# Patient Record
Sex: Female | Born: 1960 | Race: White | Hispanic: Yes | Smoking: Current every day smoker
Health system: Southern US, Community
[De-identification: ages and names within clinical notes are randomized; demographics above are authoritative.]

## PROBLEM LIST (undated history)

## (undated) DIAGNOSIS — F329 Major depressive disorder, single episode, unspecified: Secondary | ICD-10-CM

## (undated) DIAGNOSIS — R7303 Prediabetes: Secondary | ICD-10-CM

## (undated) DIAGNOSIS — F32A Depression, unspecified: Secondary | ICD-10-CM

## (undated) DIAGNOSIS — Z9889 Other specified postprocedural states: Secondary | ICD-10-CM

## (undated) DIAGNOSIS — F419 Anxiety disorder, unspecified: Secondary | ICD-10-CM

## (undated) DIAGNOSIS — M1990 Unspecified osteoarthritis, unspecified site: Secondary | ICD-10-CM

## (undated) DIAGNOSIS — Z8489 Family history of other specified conditions: Secondary | ICD-10-CM

## (undated) DIAGNOSIS — R112 Nausea with vomiting, unspecified: Secondary | ICD-10-CM

## (undated) DIAGNOSIS — F319 Bipolar disorder, unspecified: Secondary | ICD-10-CM

## (undated) DIAGNOSIS — I10 Essential (primary) hypertension: Secondary | ICD-10-CM

## (undated) HISTORY — PX: OTHER SURGICAL HISTORY: SHX169

## (undated) HISTORY — PX: JOINT REPLACEMENT: SHX530

## (undated) HISTORY — PX: CARPAL TUNNEL RELEASE: SHX101

## (undated) HISTORY — PX: COLONOSCOPY: SHX174

## (undated) HISTORY — DX: Bipolar disorder, unspecified: F31.9

## (undated) HISTORY — PX: TUBAL LIGATION: SHX77

## (undated) HISTORY — DX: Unspecified osteoarthritis, unspecified site: M19.90

## (undated) HISTORY — PX: ANTERIOR CRUCIATE LIGAMENT (ACL) REVISION: SHX6707

## (undated) HISTORY — DX: Depression, unspecified: F32.A

## (undated) HISTORY — DX: Anxiety disorder, unspecified: F41.9

## (undated) HISTORY — DX: Major depressive disorder, single episode, unspecified: F32.9

---

## 1988-03-09 HISTORY — PX: WRIST ARTHROSCOPY: SUR100

## 2011-03-10 HISTORY — PX: ENDOMETRIAL ABLATION: SHX621

## 2011-03-10 HISTORY — PX: DILATION AND CURETTAGE OF UTERUS: SHX78

## 2012-11-23 ENCOUNTER — Ambulatory Visit: Payer: Self-pay

## 2016-09-22 ENCOUNTER — Emergency Department: Admission: EM | Admit: 2016-09-22 | Discharge: 2016-09-22 | Discharge status: Against Medical Advice | Payer: Self-pay

## 2017-07-13 ENCOUNTER — Ambulatory Visit (INDEPENDENT_AMBULATORY_CARE_PROVIDER_SITE_OTHER): Payer: Medicaid Other | Admitting: Family Medicine

## 2017-07-13 ENCOUNTER — Ambulatory Visit
Admission: RE | Admit: 2017-07-13 | Discharge: 2017-07-13 | Disposition: A | Discharge status: Home / Self Care | Payer: Medicaid Other | Source: Ambulatory Visit | Attending: Family Medicine | Admitting: Family Medicine

## 2017-07-13 ENCOUNTER — Encounter: Payer: Self-pay | Admitting: Family Medicine

## 2017-07-13 ENCOUNTER — Telehealth: Payer: Self-pay

## 2017-07-13 VITALS — BP 122/82 | HR 90 | Temp 97.9°F | Resp 16 | Ht 64.0 in | Wt 178.0 lb

## 2017-07-13 DIAGNOSIS — M25561 Pain in right knee: Secondary | ICD-10-CM | POA: Insufficient documentation

## 2017-07-13 DIAGNOSIS — M5136 Other intervertebral disc degeneration, lumbar region: Secondary | ICD-10-CM | POA: Diagnosis not present

## 2017-07-13 DIAGNOSIS — M1990 Unspecified osteoarthritis, unspecified site: Secondary | ICD-10-CM | POA: Insufficient documentation

## 2017-07-13 DIAGNOSIS — F419 Anxiety disorder, unspecified: Secondary | ICD-10-CM | POA: Insufficient documentation

## 2017-07-13 DIAGNOSIS — R6889 Other general symptoms and signs: Secondary | ICD-10-CM | POA: Diagnosis not present

## 2017-07-13 DIAGNOSIS — F17200 Nicotine dependence, unspecified, uncomplicated: Secondary | ICD-10-CM | POA: Insufficient documentation

## 2017-07-13 DIAGNOSIS — M1711 Unilateral primary osteoarthritis, right knee: Secondary | ICD-10-CM | POA: Insufficient documentation

## 2017-07-13 DIAGNOSIS — I10 Essential (primary) hypertension: Secondary | ICD-10-CM | POA: Insufficient documentation

## 2017-07-13 DIAGNOSIS — G8929 Other chronic pain: Secondary | ICD-10-CM | POA: Diagnosis not present

## 2017-07-13 DIAGNOSIS — M11261 Other chondrocalcinosis, right knee: Secondary | ICD-10-CM | POA: Insufficient documentation

## 2017-07-13 DIAGNOSIS — F319 Bipolar disorder, unspecified: Secondary | ICD-10-CM | POA: Diagnosis not present

## 2017-07-13 DIAGNOSIS — M47817 Spondylosis without myelopathy or radiculopathy, lumbosacral region: Secondary | ICD-10-CM | POA: Diagnosis not present

## 2017-07-13 DIAGNOSIS — M25562 Pain in left knee: Secondary | ICD-10-CM | POA: Insufficient documentation

## 2017-07-13 DIAGNOSIS — M25531 Pain in right wrist: Secondary | ICD-10-CM | POA: Insufficient documentation

## 2017-07-13 DIAGNOSIS — Z720 Tobacco use: Secondary | ICD-10-CM

## 2017-07-13 DIAGNOSIS — R030 Elevated blood-pressure reading, without diagnosis of hypertension: Secondary | ICD-10-CM

## 2017-07-13 DIAGNOSIS — M545 Low back pain: Secondary | ICD-10-CM | POA: Insufficient documentation

## 2017-07-13 DIAGNOSIS — Z5181 Encounter for therapeutic drug level monitoring: Secondary | ICD-10-CM | POA: Insufficient documentation

## 2017-07-13 DIAGNOSIS — Z791 Long term (current) use of non-steroidal anti-inflammatories (NSAID): Secondary | ICD-10-CM

## 2017-07-13 DIAGNOSIS — M25461 Effusion, right knee: Secondary | ICD-10-CM | POA: Diagnosis not present

## 2017-07-13 DIAGNOSIS — M25532 Pain in left wrist: Secondary | ICD-10-CM | POA: Insufficient documentation

## 2017-07-13 NOTE — Assessment & Plan Note (Signed)
Discussed risks of ongoing chronic NSAID use Check creatinine today

## 2017-07-13 NOTE — Progress Notes (Signed)
Patient: Alexa Newman, Female    DOB: Jun 06, 1960, 57 y.o.   MRN: [id number] Visit Date: 07/13/2017  Today's Provider: [name], MD   I, Martha Clan, CMA, am acting as scribe for [name], MD.  Chief Complaint  Patient presents with  . Establish Care   Subjective:    Establish Care Alexa Newman is a 57 y.o. female who presents today to establish care. She feels fairly well.  Pt's PMH includes anxiety, depression, chronic back pain/arthritis. Pt was previously taking diclofenac 100 mg BID, Lamictal 200 mg daily, propanolol 10 mg PRN, and Flexeril 5 mg PRN. She has not a had a PCP in about 3 years. She states she has never had a colonoscopy, has not had a mammogram in about 3 years.  Patient reports history of swelling, pain, and stiffness of multiple joints including bilateral wrist, knees, and low back.  She states that she had a blood test in the past that showed she did not have rheumatoid arthritis.  She was previously taking diclofenac regularly and Flexeril as needed and was in less pain when she is doing this.  She is currently taking ibuprofen daily and states that helped her joints some.  She thinks that the air conditioning makes them feel worse.  She denies instability.  She does have some tingling of her fingers of both hands right worse than left.  This is worse when sleeping.  She has noticed swelling of her right wrist that is worsening over the last few years.  Previously diagnosed with bipolar disorder and anxiety and was followed by psychiatry previously.  States that she was previously well controlled on Lamictal.  She has been off of this medicine for at least 3 years.  She is interested in seeing a psychiatrist again.  She states that since being off her Lamictal she has found herself to have more manic episodes.  She states that her niece who also has bipolar disorder seems "normal" on her new medication and she is interested in doing the  same.  Patient is postmenopausal and status post D&C and endometrial ablation for heavy uterine bleeding.  She denies any postmenopausal bleeding.  She was previously taking black cohosh which helped with her hot flashes.  She finds herself now sweating more profusely than previously.  She wonders if this is related to her menopausal state.  Patient is a long-term smoker.  She states she is not interested in quitting and knows the risks of continuing to smoke.  Her mother died of lung cancer.  Depression screen PHQ 2/9 07/13/2017  Decreased Interest 1  Down, Depressed, Hopeless 1  PHQ - 2 Score 2  Altered sleeping 3  Tired, decreased energy 1  Change in appetite 1  Feeling bad or failure about yourself  1  Trouble concentrating 3  Moving slowly or fidgety/restless 0  Suicidal thoughts 0  PHQ-9 Score 11  Difficult doing work/chores Not difficult at all   GAD 7 : Generalized Anxiety Score 07/13/2017  Nervous, Anxious, on Edge 2  Control/stop worrying 2  Worry too much - different things 2  Trouble relaxing 2  Restless 1  Easily annoyed or irritable 2  Afraid - awful might happen 1  Total GAD 7 Score 12  Anxiety Difficulty Somewhat difficult   -----------------------------------------------------------------   Review of Systems  Constitutional: Positive for diaphoresis. Negative for activity change, appetite change, chills, fatigue, fever and unexpected weight change.  HENT: Negative.  Eyes: Negative.   Respiratory: Negative.   Cardiovascular: Negative.   Gastrointestinal: Negative.   Endocrine: Negative.   Genitourinary: Negative.   Musculoskeletal: Negative.   Skin: Negative.   Allergic/Immunologic: Positive for environmental allergies. Negative for food allergies and immunocompromised state.  Neurological: Positive for numbness. Negative for dizziness, tremors, seizures, syncope, facial asymmetry, speech difficulty, weakness, light-headedness and headaches.  Hematological:  Negative.   Psychiatric/Behavioral: Positive for agitation, confusion, decreased concentration and sleep disturbance. Negative for behavioral problems, dysphoric mood, hallucinations, self-injury and suicidal ideas. The patient is nervous/anxious. The patient is not hyperactive.     Social History      She  reports that she has been smoking cigarettes.  She has a 33.75 pack-year smoking history. She has never used smokeless tobacco. She reports that she drinks alcohol. She reports that she has current or past drug history.       Social History   Socioeconomic History  . Marital status: Divorced    Spouse name: Not on file  . Number of children: 1  . Years of education: Not on file  . Highest education level: Some college, no degree  Occupational History  . Occupation: disabled    Comment: on SSI awaiting decision about disability  Social Needs  . Financial resource strain: Not on file  . Food insecurity:    Worry: Not on file    Inability: Not on file  . Transportation needs:    Medical: Not on file    Non-medical: Not on file  Tobacco Use  . Smoking status: Current Every Day Smoker    Packs/day: 0.75    Years: 45.00    Pack years: 33.75    Types: Cigarettes  . Smokeless tobacco: Never Used  . Tobacco comment: started smoking at age 84  Substance and Sexual Activity  . Alcohol use: Yes    Comment: 1-2 times per month  . Drug use: Not Currently    Comment: former crack cocaine user  . Sexual activity: Yes    Partners: Male    Birth control/protection: Post-menopausal, Surgical  Lifestyle  . Physical activity:    Days per week: Not on file    Minutes per session: Not on file  . Stress: Not on file  Relationships  . Social connections:    Talks on phone: Not on file    Gets together: Not on file    Attends religious service: Not on file    Active member of club or organization: Not on file    Attends meetings of clubs or organizations: Not on file    Relationship  status: Not on file  Other Topics Concern  . Not on file  Social History Narrative  . Not on file    Past Medical History:  Diagnosis Date  . Anxiety   . Arthritis   . Bipolar 1 disorder ([place])   . Depression      Patient Active Problem List   Diagnosis Date Noted  . Heat intolerance 07/13/2017  . Encounter for monitoring chronic NSAID therapy 07/13/2017  . Tobacco abuse 07/13/2017  . Elevated BP without diagnosis of hypertension 07/13/2017  . Bipolar 1 disorder ([place])   . Anxiety   . Arthritis     Past Surgical History:  Procedure Laterality Date  . ANTERIOR CRUCIATE LIGAMENT (ACL) REVISION Right   . DILATION AND CURETTAGE OF UTERUS  2013   for heavy bleeding  . ENDOMETRIAL ABLATION  2013  . TUBAL LIGATION    .  WRIST ARTHROSCOPY Left 1990   for degenerative changes    Family History        Family Status  Relation Name Status  . Mother  Deceased  . Father  Deceased  . Sister  Deceased  . Neg Hx  (Not Specified)        Her family history includes Breast cancer (age of onset: 4) in her sister; Congestive Heart Failure in her father; Crohn's disease in her mother; Depression in her mother; Diabetes in her mother; Hypertension in her mother; Lung cancer (age of onset: 41) in her mother. There is no history of Colon cancer, Ovarian cancer, or Cervical cancer.      No Known Allergies   Current Outpatient Medications:  .  ibuprofen (ADVIL,MOTRIN) 200 MG tablet, Take 400-600 mg by mouth every 6 (six) hours as needed., Disp: , Rfl:    Patient Care Team: Virginia Crews, MD as PCP - General (Family Medicine)      Objective:   Vitals: BP 122/82 (BP Location: Left Arm, Cuff Size: Normal)   Pulse 90   Temp 97.9 F (36.6 C) (Oral)   Resp 16   Ht 5\' 4"  (1.626 m)   Wt 178 lb (80.7 kg)   LMP 12/07/2011   SpO2 98%   BMI 30.55 kg/m    Vitals:   07/13/17 1013 07/13/17 1103  BP: (!) 138/92 122/82  Pulse: 90   Resp: 16   Temp: 97.9 F (36.6 C)   TempSrc:  Oral   SpO2: 98%   Weight: 178 lb (80.7 kg)   Height: 5\' 4"  (1.626 m)      Physical Exam  Constitutional: She is oriented to person, place, and time. She appears well-developed and well-nourished. No distress.  HENT:  Head: Normocephalic and atraumatic.  Right Ear: External ear normal.  Left Ear: External ear normal.  Nose: Nose normal.  Mouth/Throat: Oropharynx is clear and moist.  Eyes: Pupils are equal, round, and reactive to light. Conjunctivae and EOM are normal. No scleral icterus.  Neck: Neck supple. No thyromegaly present.  Cardiovascular: Normal rate, regular rhythm, normal heart sounds and intact distal pulses.  No murmur heard. Pulmonary/Chest: Effort normal and breath sounds normal. No respiratory distress. She has no wheezes. She has no rales.  Abdominal: Soft. Bowel sounds are normal. She exhibits no distension. There is no tenderness. There is no rebound and no guarding.  Musculoskeletal:  Wrists: Tenderness palpation at base of thumb and diffusely over carpal bones of bilateral wrists.  Positive Tinel's and Phalen's.  Significant swelling over dorsal aspect of right wrist Knees: Range of motion is intact.  Mild swelling of right knee is present.  No joint line tenderness.  No tenderness over patellar or quadriceps tendons.  Ligaments with stable endpoints able to walk  Lymphadenopathy:    She has no cervical adenopathy.  Neurological: She is alert and oriented to person, place, and time.  Skin: Skin is warm and dry. Capillary refill takes less than 2 seconds. No rash noted.  Psychiatric: She has a normal mood and affect. Her speech is normal and behavior is normal. Judgment and thought content normal. Cognition and memory are normal.  Vitals reviewed.   Assessment & Plan:    Problem List Items Addressed This Visit      Musculoskeletal and Integument   Arthritis    Pain, swelling, stiffness chronically in multiple joints including wrists, knees, or spine We will  start by getting x-rays of these joints to  evaluate further Discussed risks of chronic NSAID use For now, patient can use Aleve up to twice daily as needed We will check her creatinine today Referral to orthopedics for further evaluation and management She may benefit from corticosteroid injections especially of her knees and possibly L-spine Suspect there may be a component of bilateral carpal tunnel syndrome possibly related to degenerative changes      Relevant Medications   ibuprofen (ADVIL,MOTRIN) 200 MG tablet   Other Relevant Orders   DG Knee Complete 4 Views Right   DG Knee Complete 4 Views Left   DG Knee Bilateral Standing AP   DG Lumbar Spine Complete   DG Wrist Complete Left   DG Wrist Complete Right   Ambulatory referral to Orthopedic Surgery     Other   Bipolar 1 disorder ([place])    Patient currently untreated and was previously on mood stabilizers Referral to psychiatry for medication management      Relevant Orders   Ambulatory referral to Psychiatry   Anxiety    Would avoid SSRIs for management of anxiety given history of bipolar disorder with manic episodes Referral to psychiatry as above for bipolar disorder as well as anxiety Would also avoid benzos at this time given her history of crack cocaine abuse      Relevant Orders   Ambulatory referral to Psychiatry   Heat intolerance    Suspect this is related to vasomotor symptoms of menopause Would avoid treatment with SSRIs given history of bipolar disorder with manic episodes Check TSH      Relevant Orders   TSH   Encounter for monitoring chronic NSAID therapy    Discussed risks of ongoing chronic NSAID use Check creatinine today      Relevant Orders   Comprehensive metabolic panel   Tobacco abuse    3 to 5-minute discussion regarding the health risks of continuing smoking, the importance of cessation, and options for cessation Patient's mother died of lung cancer due to smoking and her brother is  being monitored for a lung nodule We will discuss lung cancer screening at her upcoming physical exam She is pre-contemplative and not interested in quitting at this time but we will continue to assess      Elevated BP without diagnosis of hypertension    Blood pressure elevated today, but patient states she is quite stressed today We will continue to monitor Advised on diet and exercise          Return in about 3 months (around 10/13/2017) for CPE.   The entirety of the information documented in the History of Present Illness, Review of Systems and Physical Exam were personally obtained by me. Portions of this information were initially documented by Raquel Sarna Ratchford, CMA and reviewed by me for thoroughness and accuracy.    Virginia Crews, MD, MPH [facility] 07/13/2017 11:19 AM

## 2017-07-13 NOTE — Assessment & Plan Note (Signed)
Blood pressure elevated today, but patient states she is quite stressed today We will continue to monitor Advised on diet and exercise

## 2017-07-13 NOTE — Assessment & Plan Note (Signed)
Suspect this is related to vasomotor symptoms of menopause Would avoid treatment with SSRIs given history of bipolar disorder with manic episodes Check TSH

## 2017-07-13 NOTE — Assessment & Plan Note (Signed)
Would avoid SSRIs for management of anxiety given history of bipolar disorder with manic episodes Referral to psychiatry as above for bipolar disorder as well as anxiety Would also avoid benzos at this time given her history of crack cocaine abuse

## 2017-07-13 NOTE — Assessment & Plan Note (Signed)
Patient currently untreated and was previously on mood stabilizers Referral to psychiatry for medication management

## 2017-07-13 NOTE — Patient Instructions (Signed)

## 2017-07-13 NOTE — Assessment & Plan Note (Signed)
3 to 5-minute discussion regarding the health risks of continuing smoking, the importance of cessation, and options for cessation Patient's mother died of lung cancer due to smoking and her brother is being monitored for a lung nodule We will discuss lung cancer screening at her upcoming physical exam She is pre-contemplative and not interested in quitting at this time but we will continue to assess

## 2017-07-13 NOTE — Telephone Encounter (Signed)
Tiffany from outpatient imaging called to request a cancellation of the bilateral knee standing AP xray. States they will have the AP image done through the bilateral complete 4 view xrays.

## 2017-07-13 NOTE — Assessment & Plan Note (Signed)
Pain, swelling, stiffness chronically in multiple joints including wrists, knees, or spine We will start by getting x-rays of these joints to evaluate further Discussed risks of chronic NSAID use For now, patient can use Aleve up to twice daily as needed We will check her creatinine today Referral to orthopedics for further evaluation and management She may benefit from corticosteroid injections especially of her knees and possibly L-spine Suspect there may be a component of bilateral carpal tunnel syndrome possibly related to degenerative changes

## 2017-07-14 ENCOUNTER — Telehealth: Payer: Self-pay

## 2017-07-14 LAB — COMPREHENSIVE METABOLIC PANEL
A/G RATIO: 1.6 (ref 1.2–2.2)
ALK PHOS: 91 IU/L (ref 39–117)
ALT: 9 IU/L (ref 0–32)
AST: 10 IU/L (ref 0–40)
Albumin: 3.9 g/dL (ref 3.5–5.5)
BUN/Creatinine Ratio: 23 (ref 9–23)
BUN: 12 mg/dL (ref 6–24)
CHLORIDE: 105 mmol/L (ref 96–106)
CO2: 23 mmol/L (ref 20–29)
Calcium: 9.2 mg/dL (ref 8.7–10.2)
Creatinine, Ser: 0.53 mg/dL — ABNORMAL LOW (ref 0.57–1.00)
GFR calc non Af Amer: 106 mL/min/{1.73_m2} (ref >59)
GFR, EST AFRICAN AMERICAN: 123 mL/min/{1.73_m2} (ref >59)
Globulin, Total: 2.4 g/dL (ref 1.5–4.5)
Glucose: 96 mg/dL (ref 65–99)
POTASSIUM: 4.3 mmol/L (ref 3.5–5.2)
Sodium: 141 mmol/L (ref 134–144)
Total Protein: 6.3 g/dL (ref 6.0–8.5)

## 2017-07-14 LAB — TSH: TSH: 0.336 u[IU]/mL — AB (ref 0.450–4.500)

## 2017-07-14 NOTE — Addendum Note (Signed)
Addended by: Virginia Crews on: 07/14/2017 08:25 AM   Modules accepted: Orders

## 2017-07-14 NOTE — Telephone Encounter (Signed)
Done  Bacigalupo, Dionne Bucy, MD, MPH [facility] 07/14/2017 8:25 AM

## 2017-07-14 NOTE — Telephone Encounter (Signed)
-----   Message from Virginia Crews, MD sent at 07/14/2017  8:35 AM EDT ----- Normal kidney function, liver function, electrolytes.  Thyroid function level is slightly low.  We will send a confirmatory test (please add on free T4) to see how significant this is.  Virginia Crews, MD, MPH [facility] 07/14/2017 8:35 AM

## 2017-07-14 NOTE — Telephone Encounter (Signed)
-----   Message from Virginia Crews, MD sent at 07/14/2017  8:57 AM EDT ----- Multiple XRays reviewed.  Bilateral wrists with widening of scapholunate joint space which looks to be from a possible ligament injury previously.  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  Some degenerative changes of low back.  Orthopedics will follow-up on this.  Virginia Crews, MD, MPH [facility] 07/14/2017 8:57 AM

## 2017-07-14 NOTE — Telephone Encounter (Signed)
Pt advised of lab results. Asked the phlebotomist to add on the free T4.

## 2017-07-14 NOTE — Telephone Encounter (Signed)
Pt advised.

## 2017-07-16 ENCOUNTER — Telehealth: Payer: Self-pay

## 2017-07-16 LAB — SPECIMEN STATUS REPORT

## 2017-07-16 LAB — T4, FREE: FREE T4: 1.19 ng/dL (ref 0.82–1.77)

## 2017-07-16 NOTE — Telephone Encounter (Signed)
-----   Message from Virginia Crews, MD sent at 07/16/2017  8:16 AM EDT ----- Thyroid hormone is normal.  We will recheck thyroid labs in ~6 months to continue to follow this  Virginia Crews, MD, MPH [facility] 07/16/2017 8:16 AM

## 2017-07-16 NOTE — Telephone Encounter (Signed)
Left message advising pt. OK per DPR. 

## 2017-07-19 DIAGNOSIS — M47816 Spondylosis without myelopathy or radiculopathy, lumbar region: Secondary | ICD-10-CM | POA: Diagnosis not present

## 2017-07-19 DIAGNOSIS — M545 Low back pain: Secondary | ICD-10-CM | POA: Diagnosis not present

## 2017-07-19 DIAGNOSIS — M25531 Pain in right wrist: Secondary | ICD-10-CM | POA: Diagnosis not present

## 2017-07-19 DIAGNOSIS — M19131 Post-traumatic osteoarthritis, right wrist: Secondary | ICD-10-CM | POA: Diagnosis not present

## 2017-07-27 ENCOUNTER — Other Ambulatory Visit: Payer: Self-pay

## 2017-07-27 ENCOUNTER — Ambulatory Visit (INDEPENDENT_AMBULATORY_CARE_PROVIDER_SITE_OTHER): Payer: Medicaid Other | Admitting: Psychiatry

## 2017-07-27 ENCOUNTER — Encounter: Payer: Self-pay | Admitting: Psychiatry

## 2017-07-27 VITALS — BP 154/89 | HR 97 | Temp 98.4°F | Wt 176.6 lb

## 2017-07-27 DIAGNOSIS — F3162 Bipolar disorder, current episode mixed, moderate: Secondary | ICD-10-CM

## 2017-07-27 DIAGNOSIS — F1421 Cocaine dependence, in remission: Secondary | ICD-10-CM | POA: Diagnosis not present

## 2017-07-27 DIAGNOSIS — F17200 Nicotine dependence, unspecified, uncomplicated: Secondary | ICD-10-CM | POA: Diagnosis not present

## 2017-07-27 DIAGNOSIS — F5105 Insomnia due to other mental disorder: Secondary | ICD-10-CM | POA: Diagnosis not present

## 2017-07-27 DIAGNOSIS — Z8659 Personal history of other mental and behavioral disorders: Secondary | ICD-10-CM | POA: Diagnosis not present

## 2017-07-27 MED ORDER — LAMOTRIGINE 25 MG PO TABS: 25.0000 mg | ORAL_TABLET | Freq: Every day | ORAL | 0 refills | Status: DC

## 2017-07-27 MED ORDER — MIRTAZAPINE 15 MG PO TABS: 7.5000 mg | ORAL_TABLET | Freq: Every day | ORAL | 1 refills | Status: DC
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
## 2017-07-27 NOTE — Patient Instructions (Signed)
Stevens-Johnson Syndrome Stevens-Johnson syndrome is a disorder of the mucous membranes and skin. This disorder causes these things to happen:  The mucous membranes become inflamed.  The top layer of skin dies and starts to shed. The more skin that dies, the more serious the disorder becomes.  The body loses fluids quickly.  The body loses its ability to keep germs out.  This condition requires immediate treatment to prevent complications such as:  Too much fluid loss.  Blood infection.  Eye damage.  Skin damage and infection.  Vision loss, if the eyes are affected.  Damage to the lungs, heart, kidneys, or liver.  What are the causes? The most common cause of this condition is an allergic reaction to a medicine. Medicines that are known to cause this condition include:  Antibiotic medicines.  Antiseizure medicines.  Medicine that is used to treat gout.  Cocaine.  NSAIDs.  This condition can also be caused by an infection. In some cases, the cause may not be known. What increases the risk? This condition is more likely to develop in:  People who have a variation in the HLA gene. This variation may be passed down through families (inherited).  People who have a family history of Stevens-Johnson syndrome.  People who have cancer or are having cancer treatment.  People who have a weak body defense system (immune system).  People who have systemic lupus erythematosus.  People of [place], [place], or [place] descent.  What are the signs or symptoms? This condition often begins with several days of flu-like symptoms, such as:  Fever.  Sore throat.  Fatigue.  Headache.  Muscle aches.  Dry cough.  Burning feeling in the eyes.  Then, a painful red or purple rash may develop on the face, trunk, palms, or soles, and spread to other parts of the body. The rash creates blisters and open sores on the skin. If the mucous membranes are affected, the rash may be  in:  The mouth.  The nose.  The eyes.  The genitals.  The digestive tract.  The urinary tract.  Other signs and symptoms include:  Shedding of the skin or mucous membrane.  Swelling of the tongue and face.  Swelling and itching of the skin (hives).  Redness, sensitivity to light, and dryness in the eyes.  Pain in the mouth and throat.  Pain when passing urine.  Pain when swallowing.  How is this diagnosed? This condition is diagnosed with a physical exam. Your health care provider may also do tests, such as:  A biopsy. This involves removing a sample of skin or eye tissue to be looked at under a microscope.  Blood tests  Imaging tests.  If you are having any eye symptoms, you may need to be seen by an eye specialist (ophthalmologist). How is this treated? This condition may be treated by:  Stopping medicines that you are currently taking.  Getting fluids and nourishment through an IV tube or through a tube that is passed through your nose and into your stomach (nasogastric tube).  Gently removing dead skin and putting a moist bandage (dressing) on those areas.  Applying eye drops or having eye surgery.  Using a mouthwash that numbs the mouth and throat to help with swallowing.  Medicines: ? To help you relax (sedatives). ? To control your pain. ? To fight infection (antibiotics). ? To stop skin swelling and itching.  Follow these instructions at home: Medicines  Take over-the-counter and prescription medicines only as told  by your health care provider.  If you were prescribed an antibiotic medicine, take it as told by your health care provider. Do not stop taking the antibiotic even if you start to feel better.  If a medicine triggered your condition, talk with your health care provider before you take the medicine again. Do not take it if your health care provider tells you not to.  Do not start taking any new medicines before you ask your health  care provider if they are safe for you. 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 If the condition was caused by a medicine, always tell your health care providers which medicine caused it.  Wear a medical bracelet or necklace that says that you had this condition and tells the cause of it.  Ask your health care provider if you should be tested for the HLA gene.  Keep all follow-up visits as told by your health care provider. This is important. Contact a health care provider if:  You have trouble managing complications of the condition. Get help right away if:  You have flu-like symptoms after you have an infection or after you start a new medicine.  You develop symptoms on your skin or mucous membranes again. This information is not intended to replace advice given to you by your health care provider. Make sure you discuss any questions you have with your health care provider. Document Released: 11/06/2010 Document Revised: 10/22/2015 Document Reviewed: 05/16/2014 Elsevier Interactive Patient Education  2018 [employer]. Mirtazapine tablets What is this medicine? MIRTAZAPINE (mir TAZ a peen) is used to treat depression. This medicine may be used for other purposes; ask your health care provider or pharmacist if you have questions. COMMON BRAND NAME(S): Remeron What should I tell my health care provider before I take this medicine? They need to know if you have any of these conditions: -bipolar disorder -glaucoma -kidney disease -liver disease -suicidal thoughts -an unusual or allergic reaction to mirtazapine, other medicines, foods, dyes, or preservatives -pregnant or trying to get pregnant -breast-feeding How should I use this medicine? Take this medicine by mouth with a glass of water. Follow the directions on the prescription label. Take your medicine at regular intervals. Do not take your medicine more often than  directed. Do not stop taking this medicine suddenly except upon the advice of your doctor. Stopping this medicine too quickly may cause serious side effects or your condition may worsen. A special MedGuide will be given to you by the pharmacist with each prescription and refill. Be sure to read this information carefully each time. Talk to your pediatrician regarding the use of this medicine in children. Special care may be needed. Overdosage: If you think you have taken too much of this medicine contact a poison control center or emergency room at once. NOTE: This medicine is only for you. Do not share this medicine with others. What if I miss a dose? If you miss a dose, take it as soon as you can. If it is almost time for your next dose, take only that dose. Do not take double or extra doses. What may interact with this medicine? Do not take this medicine with any of the following medications: -linezolid -MAOIs like Carbex, Eldepryl, Marplan, Nardil, and Parnate -methylene blue (injected into a vein) This medicine may also interact with the following medications: -alcohol -antiviral medicines for HIV or AIDS -certain medicines that treat or prevent blood clots like  warfarin -certain medicines for depression, anxiety, or psychotic disturbances -certain medicines for fungal infections like ketoconazole and itraconazole -certain medicines for migraine headache like almotriptan, eletriptan, frovatriptan, naratriptan, rizatriptan, sumatriptan, zolmitriptan -certain medicines for seizures like carbamazepine or phenytoin -certain medicines for sleep -cimetidine -erythromycin -fentanyl -lithium -medicines for blood pressure -nefazodone -rasagiline -rifampin -supplements like St. John's wort, kava kava, valerian -tramadol -tryptophan This list may not describe all possible interactions. Give your health care provider a list of all the medicines, herbs, non-prescription drugs, or dietary  supplements you use. Also tell them if you smoke, drink alcohol, or use illegal drugs. Some items may interact with your medicine. What should I watch for while using this medicine? Tell your doctor if your symptoms do not get better or if they get worse. Visit your doctor or health care professional for regular checks on your progress. Because it may take several weeks to see the full effects of this medicine, it is important to continue your treatment as prescribed by your doctor. Patients and their families should watch out for new or worsening thoughts of suicide or depression. Also watch out for sudden changes in feelings such as feeling anxious, agitated, panicky, irritable, hostile, aggressive, impulsive, severely restless, overly excited and hyperactive, or not being able to sleep. If this happens, especially at the beginning of treatment or after a change in dose, call your health care professional. Dennis Bast may get drowsy or dizzy. Do not drive, use machinery, or do anything that needs mental alertness until you know how this medicine affects you. Do not stand or sit up quickly, especially if you are an older patient. This reduces the risk of dizzy or fainting spells. Alcohol may interfere with the effect of this medicine. Avoid alcoholic drinks. This medicine may cause dry eyes and blurred vision. If you wear contact lenses you may feel some discomfort. Lubricating drops may help. See your eye doctor if the problem does not go away or is severe. Your mouth may get dry. Chewing sugarless gum or sucking hard candy, and drinking plenty of water may help. Contact your doctor if the problem does not go away or is severe. What side effects may I notice from receiving this medicine? Side effects that you should report to your doctor or health care professional as soon as possible: -allergic reactions like skin rash, itching or hives, swelling of the face, lips, or tongue -anxious -changes in vision -chest  pain -confusion -elevated mood, decreased need for sleep, racing thoughts, impulsive behavior -eye pain -fast, irregular heartbeat -feeling faint or lightheaded, falls -feeling agitated, angry, or irritable -fever or chills, sore throat -hallucination, loss of contact with reality -loss of balance or coordination -mouth sores -redness, blistering, peeling or loosening of the skin, including inside the mouth -restlessness, pacing, inability to keep still -seizures -stiff muscles -suicidal thoughts or other mood changes -trouble passing urine or change in the amount of urine -trouble sleeping -unusual bleeding or bruising -unusually weak or tired -vomiting Side effects that usually do not require medical attention (report to your doctor or health care professional if they continue or are bothersome): -change in appetite -constipation -dizziness -dry mouth -muscle aches or pains -nausea -tired -weight gain This list may not describe all possible side effects. Call your doctor for medical advice about side effects. You may report side effects to FDA at 1-800-FDA-1088. Where should I keep my medicine? Keep out of the reach of children. Store at room temperature between 15 and 30 degrees C (59  and 86 degrees F) Protect from light and moisture. Throw away any unused medicine after the expiration date. NOTE: This sheet is a summary. It may not cover all possible information. If you have questions about this medicine, talk to your doctor, pharmacist, or health care provider.  2018 Elsevier/Gold Standard (2015-07-25 17:30:45) Lamotrigine tablets What is this medicine? LAMOTRIGINE (la MOE Hendricks Limes) is used to control seizures in adults and children with epilepsy and Lennox-Gastaut syndrome. It is also used in adults to treat bipolar disorder. This medicine may be used for other purposes; ask your health care provider or pharmacist if you have questions. COMMON BRAND NAME(S):  Lamictal What should I tell my health care provider before I take this medicine? They need to know if you have any of these conditions: -a history of depression or bipolar disorder -aseptic meningitis during prior use of lamotrigine -folate deficiency -kidney disease -liver disease -suicidal thoughts, plans, or attempt; a previous suicide attempt by you or a family member -an unusual or allergic reaction to lamotrigine or other seizure medications, other medicines, foods, dyes, or preservatives -pregnant or trying to get pregnant -breast-feeding How should I use this medicine? Take this medicine by mouth with a glass of water. Follow the directions on the prescription label. Do not chew these tablets. If this medicine upsets your stomach, take it with food or milk. Take your doses at regular intervals. Do not take your medicine more often than directed. A special MedGuide will be given to you by the pharmacist with each new prescription and refill. Be sure to read this information carefully each time. Talk to your pediatrician regarding the use of this medicine in children. While this drug may be prescribed for children as young as 2 years for selected conditions, precautions do apply. Overdosage: If you think you have taken too much of this medicine contact a poison control center or emergency room at once. NOTE: This medicine is only for you. Do not share this medicine with others. What if I miss a dose? If you miss a dose, take it as soon as you can. If it is almost time for your next dose, take only that dose. Do not take double or extra doses. What may interact with this medicine? -carbamazepine -female hormones, including contraceptive or birth control pills -methotrexate -phenobarbital -phenytoin -primidone -pyrimethamine -rifampin -trimethoprim -valproic acid This list may not describe all possible interactions. Give your health care provider a list of all the medicines, herbs,  non-prescription drugs, or dietary supplements you use. Also tell them if you smoke, drink alcohol, or use illegal drugs. Some items may interact with your medicine. What should I watch for while using this medicine? Visit your doctor or health care professional for regular checks on your progress. If you take this medicine for seizures, wear a Medic Alert bracelet or necklace. Carry an identification card with information about your condition, medicines, and doctor or health care professional. It is important to take this medicine exactly as directed. When first starting treatment, your dose will need to be adjusted slowly. It may take weeks or months before your dose is stable. You should contact your doctor or health care professional if your seizures get worse or if you have any new types of seizures. Do not stop taking this medicine unless instructed by your doctor or health care professional. Stopping your medicine suddenly can increase your seizures or their severity. Contact your doctor or health care professional right away if you develop a rash  while taking this medicine. Rashes may be very severe and sometimes require treatment in the hospital. Deaths from rashes have occurred. Serious rashes occur more often in children than adults taking this medicine. It is more common for these serious rashes to occur during the first 2 months of treatment, but a rash can occur at any time. You may get drowsy, dizzy, or have blurred vision. Do not drive, use machinery, or do anything that needs mental alertness until you know how this medicine affects you. To reduce dizzy or fainting spells, do not sit or stand up quickly, especially if you are an older patient. Alcohol can increase drowsiness and dizziness. Avoid alcoholic drinks. If you are taking this medicine for bipolar disorder, it is important to report any changes in your mood to your doctor or health care professional. If your condition gets worse, you  get mentally depressed, feel very hyperactive or manic, have difficulty sleeping, or have thoughts of hurting yourself or committing suicide, you need to get help from your health care professional right away. If you are a caregiver for someone taking this medicine for bipolar disorder, you should also report these behavioral changes right away. The use of this medicine may increase the chance of suicidal thoughts or actions. Pay special attention to how you are responding while on this medicine. Your mouth may get dry. Chewing sugarless gum or sucking hard candy, and drinking plenty of water may help. Contact your doctor if the problem does not go away or is severe. Women who become pregnant while using this medicine may enroll in the [place] Pregnancy Registry by calling [phone]. This registry collects information about the safety of antiepileptic drug use during pregnancy. What side effects may I notice from receiving this medicine? Side effects that you should report to your doctor or health care professional as soon as possible: -allergic reactions like skin rash, itching or hives, swelling of the face, lips, or tongue -blurred or double vision -difficulty walking or controlling muscle movements -fever -headache, stiff neck, and sensitivity to light -painful sores in the mouth, eyes, or nose -redness, blistering, peeling or loosening of the skin, including inside the mouth -severe muscle pain -swollen lymph glands -uncontrollable eye movements -unusual bruising or bleeding -unusually weak or tired -vomiting -worsening of mood, thoughts or actions of suicide or dying -yellowing of the eyes or skin Side effects that usually do not require medical attention (report to your doctor or health care professional if they continue or are bothersome): -diarrhea or constipation -difficulty sleeping -nausea -tremors This list may not describe all possible side  effects. Call your doctor for medical advice about side effects. You may report side effects to FDA at 1-800-FDA-1088. Where should I keep my medicine? Keep out of reach of children. Store at room temperature between 15 and 30 degrees C (59 and 86 degrees F). Throw away any unused medicine after the expiration date. NOTE: This sheet is a summary. It may not cover all possible information. If you have questions about this medicine, talk to your doctor, pharmacist, or health care provider.  2018 Elsevier/Gold Standard (2015-03-28 09:29:40)

## 2017-07-28 ENCOUNTER — Encounter: Payer: Self-pay | Admitting: Psychiatry

## 2017-08-24 ENCOUNTER — Encounter: Payer: Self-pay | Admitting: Psychiatry

## 2017-08-24 ENCOUNTER — Ambulatory Visit (INDEPENDENT_AMBULATORY_CARE_PROVIDER_SITE_OTHER): Payer: Medicaid Other | Admitting: Psychiatry

## 2017-08-24 VITALS — BP 139/87 | HR 94 | Ht 64.0 in | Wt 178.0 lb

## 2017-08-24 DIAGNOSIS — F1421 Cocaine dependence, in remission: Secondary | ICD-10-CM | POA: Diagnosis not present

## 2017-08-24 DIAGNOSIS — Z8659 Personal history of other mental and behavioral disorders: Secondary | ICD-10-CM

## 2017-08-24 DIAGNOSIS — F3162 Bipolar disorder, current episode mixed, moderate: Secondary | ICD-10-CM

## 2017-08-24 DIAGNOSIS — F17200 Nicotine dependence, unspecified, uncomplicated: Secondary | ICD-10-CM | POA: Diagnosis not present

## 2017-08-24 MED ORDER — LAMOTRIGINE 25 MG PO TABS: 75.0000 mg | ORAL_TABLET | Freq: Every day | ORAL | 0 refills | Status: DC

## 2017-08-24 MED ORDER — DOXEPIN HCL 10 MG PO CAPS: 10.0000 mg | ORAL_CAPSULE | Freq: Every evening | ORAL | 0 refills | Status: DC | PRN
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
## 2017-08-24 NOTE — Patient Instructions (Signed)
Doxepin capsules What is this medicine? DOXEPIN (DOX e pin) is used to treat depression and anxiety. This medicine may be used for other purposes; ask your health care provider or pharmacist if you have questions. COMMON BRAND NAME(S): Sinequan What should I tell my health care provider before I take this medicine? They need to know if you have any of these conditions: -bipolar disorder -difficulty passing urine -glaucoma -heart disease -if you frequently drink alcohol containing drinks -liver disease -lung or breathing disease, like asthma or sleep apnea -prostate trouble -schizophrenia -seizures -suicidal thoughts, plans, or attempt; a previous suicide attempt by you or a family member -an unusual or allergic reaction to doxepin, other medicines, foods, dyes, or preservatives -pregnant or trying to get pregnant -breast-feeding How should I use this medicine? Take this medicine by mouth with a glass of water. Follow the directions on the prescription label. Take your doses at regular intervals. Do not take your medicine more often than directed. Do not stop taking this medicine suddenly except upon the advice of your doctor. Stopping this medicine too quickly may cause serious side effects or your condition may worsen. A special MedGuide will be given to you by the pharmacist with each prescription and refill. Be sure to read this information carefully each time. Talk to your pediatrician regarding the use of this medicine in children. While this drug may be prescribed for children as young as 12 years for selected conditions, precautions do apply. Overdosage: If you think you have taken too much of this medicine contact a poison control center or emergency room at once. NOTE: This medicine is only for you. Do not share this medicine with others. What if I miss a dose? If you miss a dose, take it as soon as you can. If it is almost time for your next dose, take only that dose. Do not  take double or extra doses. What may interact with this medicine? Do not take this medicine with any of the following medications: -arsenic trioxide -certain medicines used to regulate abnormal heartbeat or to treat other heart conditions -cisapride -halofantrine -levomethadyl -linezolid -MAOIs like Carbex, Eldepryl, Marplan, Nardil, and Parnate -methylene blue -other medicines for mental depression -phenothiazines like perphenazine, thioridazine and chlorpromazine -pimozide -procarbazine -sparfloxacin -St. John's Wort -ziprasidone This medicine may also interact with the following medications: -cimetidine -tolazamide This list may not describe all possible interactions. Give your health care provider a list of all the medicines, herbs, non-prescription drugs, or dietary supplements you use. Also tell them if you smoke, drink alcohol, or use illegal drugs. Some items may interact with your medicine. What should I watch for while using this medicine? Visit your doctor or health care professional for regular checks on your progress. It can take several days before you feel the full effect of this medicine. If you have been taking this medicine regularly for some time, do not suddenly stop taking it. You must gradually reduce the dose or you may get severe side effects. Ask your doctor or health care professional for advice. Even after you stop taking this medicine it can still affect your body for several days. Patients and their families should watch out for new or worsening thoughts of suicide or depression. Also watch out for sudden changes in feelings such as feeling anxious, agitated, panicky, irritable, hostile, aggressive, impulsive, severely restless, overly excited and hyperactive, or not being able to sleep. If this happens, especially at the beginning of treatment or after a change in dose,   call your health care professional. You may get drowsy or dizzy. Do not drive, use machinery,  or do anything that needs mental alertness until you know how this medicine affects you. Do not stand or sit up quickly, especially if you are an older patient. This reduces the risk of dizzy or fainting spells. Alcohol may increase dizziness and drowsiness. Avoid alcoholic drinks. Do not treat yourself for coughs, colds, or allergies without asking your doctor or health care professional for advice. Some ingredients can increase possible side effects. Your mouth may get dry. Chewing sugarless gum or sucking hard candy, and drinking plenty of water may help. Contact your doctor if the problem does not go away or is severe. This medicine may cause dry eyes and blurred vision. If you wear contact lenses you may feel some discomfort. Lubricating drops may help. See your eye doctor if the problem does not go away or is severe. This medicine can make you more sensitive to the sun. Keep out of the sun. If you cannot avoid being in the sun, wear protective clothing and use sunscreen. Do not use sun lamps or tanning beds/booths. What side effects may I notice from receiving this medicine? Side effects that you should report to your doctor or health care professional as soon as possible: -allergic reactions like skin rash, itching or hives, swelling of the face, lips, or tongue -anxious -breathing problems -changes in vision -confusion -elevated mood, decreased need for sleep, racing thoughts, impulsive behavior -eye pain -fast, irregular heartbeat -feeling faint or lightheaded, falls -feeling agitated, angry, or irritable -fever with increased sweating -hallucination, loss of contact with reality -seizures -stiff muscles -suicidal thoughts or other mood changes -tingling, pain, or numbness in the feet or hands -trouble passing urine or change in the amount of urine -trouble sleeping -unusually weak or tired -vomiting -yellowing of the eyes or skin Side effects that usually do not require medical  attention (report to your doctor or health care professional if they continue or are bothersome): -change in sex drive or performance -change in appetite or weight -constipation -dizziness -dry mouth -nausea -tired -tremors -upset stomach This list may not describe all possible side effects. Call your doctor for medical advice about side effects. You may report side effects to FDA at 1-800-FDA-1088. Where should I keep my medicine? Keep out of the reach of children. Store at room temperature between 15 and 30 degrees C (59 and 86 degrees F). Throw away any unused medicine after the expiration date. NOTE: This sheet is a summary. It may not cover all possible information. If you have questions about this medicine, talk to your doctor, pharmacist, or health care provider.  2018 Elsevier/Gold Standard (2015-07-26 12:35:05)  

## 2017-08-25 ENCOUNTER — Encounter: Payer: Self-pay | Admitting: Psychiatry

## 2017-09-07 ENCOUNTER — Ambulatory Visit: Payer: Medicaid Other | Admitting: Psychiatry

## 2017-09-16 ENCOUNTER — Other Ambulatory Visit: Payer: Self-pay | Admitting: Psychiatry

## 2017-09-27 NOTE — Telephone Encounter (Signed)
received a fax requesting a refill on the lamictal 25mg  pt was last seen on  08-24-17 next appt 10-12-17.  lamoTRIgine (LAMICTAL) 25 MG tablet  Medication  Date: 08/24/2017 Department: [facility] Ordering/Authorizing: Ursula Alert, MD  Order Providers   Prescribing Provider Encounter Provider  Ursula Alert, MD Ursula Alert, MD  Outpatient Medication Detail    Disp Refills Start End   lamoTRIgine (LAMICTAL) 25 MG tablet 45 tablet 0 08/24/2017    Sig - Route: Take 3 tablets (75 mg total) by mouth daily. - Oral   Sent to pharmacy as: lamoTRIgine (LAMICTAL) 25 MG tablet   Notes to Pharmacy: 15 days supply   E-Prescribing Status: Receipt confirmed by pharmacy (08/24/2017 9:15 AM EDT)

## 2017-09-30 ENCOUNTER — Telehealth: Payer: Self-pay

## 2017-09-30 NOTE — Telephone Encounter (Signed)
ok 

## 2017-09-30 NOTE — Telephone Encounter (Signed)
faxed and confirmed rx lamicatal 35mg  id# [id number] order # [id number]

## 2017-10-12 ENCOUNTER — Ambulatory Visit (INDEPENDENT_AMBULATORY_CARE_PROVIDER_SITE_OTHER): Payer: Medicaid Other | Admitting: Psychiatry

## 2017-10-12 ENCOUNTER — Encounter: Payer: Self-pay | Admitting: Psychiatry

## 2017-10-12 ENCOUNTER — Other Ambulatory Visit: Payer: Self-pay

## 2017-10-12 VITALS — BP 145/84 | HR 94 | Temp 99.1°F | Wt 174.8 lb

## 2017-10-12 DIAGNOSIS — Z634 Disappearance and death of family member: Secondary | ICD-10-CM

## 2017-10-12 DIAGNOSIS — F3162 Bipolar disorder, current episode mixed, moderate: Secondary | ICD-10-CM

## 2017-10-12 DIAGNOSIS — F5105 Insomnia due to other mental disorder: Secondary | ICD-10-CM | POA: Diagnosis not present

## 2017-10-12 DIAGNOSIS — F17200 Nicotine dependence, unspecified, uncomplicated: Secondary | ICD-10-CM

## 2017-10-12 DIAGNOSIS — Z8659 Personal history of other mental and behavioral disorders: Secondary | ICD-10-CM

## 2017-10-12 MED ORDER — LAMOTRIGINE 150 MG PO TABS: 150.0000 mg | ORAL_TABLET | Freq: Every day | ORAL | 1 refills | Status: DC
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
## 2017-10-12 NOTE — Patient Instructions (Signed)
Coping With Loss, Adult People experience loss in many different ways throughout their lives. Events such as moving, changing jobs, and losing friends can create a sense of loss. The loss may be as serious as a major health change, divorce, death of a pet, or death of a loved one. All of these types of loss are likely to create a physical and emotional reaction known as grief. Grief is the result of a major change or an absence of something or someone that you count on. Grief is a normal reaction to loss. How to recognize changes A variety of factors can affect your grieving experience, including:  The nature of your loss.  Your relationship to what or whom you lost.  Your understanding of grief and how to cope with it.  Your support system.  The way that you deal with your grief will affect your ability to function as you normally do. When you are grieving, you may experience:  Numbness, shock, sadness, anxiety, anger, denial, and guilt.  Thoughts about death.  Unexpected crying.  A physical sensation of emptiness in your gut.  Problems sleeping and eating.  Fatigue.  Loss of interest in normal activities.  Dreaming about or imagining seeing the person who died.  A need to remember what or whom you lost.  Difficulty thinking about anything other than your loss for a period of time.  Relief. If you have been expecting the loss for a while, you may feel a sense of relief when it happens.  Where to find support To get support for coping with loss:  Ask your health care provider for help and recommendations, such as grief counseling or therapy.  Think about joining a support group for people who are coping with loss.  Follow these instructions at home:  Be patient with yourself and others. Allow the grieving process to happen, and remember that grieving takes time. ? It is likely that you may never feel completely done with some grief. You may find a way to move on while  still cherishing memories and feelings about your loss. ? Accepting your loss is a process. It can take months or longer to adjust.  Express your feelings in healthy ways, such as: ? Talking with others about your loss. It may be helpful to find others who have had a similar loss, such as a support group. ? Writing down your feelings in a journal. ? Doing physical activities to release stress and emotional energy. ? Doing creative activities like painting, sculpting, or playing or listening to music. ? Practicing resilience. This is the ability to recover and adjust after facing challenges. Reading some resources that encourage resilience may help you to learn ways to practice those behaviors.  Keep to your normal routine as much as possible. If you have trouble focusing or doing normal activities, it is acceptable to take some time away from your normal routine.  Spend time with friends and loved ones.  Eat a healthy diet, get plenty of sleep, and rest when you feel tired. Where to find more information: You can find more information about coping with loss from:  American Society of Clinical Oncology: www.cancer.net  American Psychological Association: www.apa.org  Contact a health care provider if:  Your grief is extreme and keeps getting worse.  You have ongoing grief that does not improve.  Your body shows symptoms of grief, such as illness.  You feel depressed, anxious, or lonely. Get help right away if:  You have   thoughts about hurting yourself or others. If you ever feel like you may hurt yourself or others, or have thoughts about taking your own life, get help right away. You can go to your nearest emergency department or call:  Your local emergency services (911 in the [place]  A suicide crisis helpline, such as the National Suicide Prevention Lifeline at 1-800-273-8255. This is open 24 hours a day.  Summary  Grief is a normal part of experiencing a loss. It is the  result of a major change or an absence of something or someone that you count on.  The depth of grief and the period of recovery depend on the type of loss as well as your ability to adjust to the change and process your feelings.  Processing grief requires patience and a willingness to accept your feelings and talk about your loss with people who are supportive.  It is important to find resources that work for you and to realize that we are all different when it comes to grief. There is not one single grieving process that works for everyone in the same way.  Be aware that when grief becomes extreme, it can lead to more severe issues like isolation, depression, anxiety, or suicidal thoughts. Talk with your health care provider if you have any of these issues. This information is not intended to replace advice given to you by your health care provider. Make sure you discuss any questions you have with your health care provider. Document Released: 07/09/2016 Document Revised: 07/09/2016 Document Reviewed: 07/09/2016 Elsevier Interactive Patient Education  2018 Elsevier Inc.  

## 2017-10-13 ENCOUNTER — Other Ambulatory Visit (HOSPITAL_COMMUNITY)
Admission: RE | Admit: 2017-10-13 | Discharge: 2017-10-13 | Disposition: A | Discharge status: Home / Self Care | Payer: Medicaid Other | Source: Ambulatory Visit | Attending: Family Medicine | Admitting: Family Medicine

## 2017-10-13 ENCOUNTER — Telehealth: Payer: Self-pay | Admitting: Gastroenterology

## 2017-10-13 ENCOUNTER — Encounter: Payer: Self-pay | Admitting: Family Medicine

## 2017-10-13 ENCOUNTER — Ambulatory Visit (INDEPENDENT_AMBULATORY_CARE_PROVIDER_SITE_OTHER): Payer: Medicaid Other | Admitting: Family Medicine

## 2017-10-13 VITALS — BP 148/88 | HR 87 | Temp 98.1°F | Resp 16 | Ht 64.0 in | Wt 174.0 lb

## 2017-10-13 DIAGNOSIS — Z114 Encounter for screening for human immunodeficiency virus [HIV]: Secondary | ICD-10-CM

## 2017-10-13 DIAGNOSIS — Z1231 Encounter for screening mammogram for malignant neoplasm of breast: Secondary | ICD-10-CM

## 2017-10-13 DIAGNOSIS — Z1159 Encounter for screening for other viral diseases: Secondary | ICD-10-CM | POA: Diagnosis not present

## 2017-10-13 DIAGNOSIS — Z1211 Encounter for screening for malignant neoplasm of colon: Secondary | ICD-10-CM

## 2017-10-13 DIAGNOSIS — I10 Essential (primary) hypertension: Secondary | ICD-10-CM

## 2017-10-13 DIAGNOSIS — Z8371 Family history of colonic polyps: Secondary | ICD-10-CM

## 2017-10-13 DIAGNOSIS — Z0000 Encounter for general adult medical examination without abnormal findings: Secondary | ICD-10-CM

## 2017-10-13 DIAGNOSIS — Z720 Tobacco use: Secondary | ICD-10-CM

## 2017-10-13 DIAGNOSIS — Z124 Encounter for screening for malignant neoplasm of cervix: Secondary | ICD-10-CM | POA: Insufficient documentation

## 2017-10-13 DIAGNOSIS — E785 Hyperlipidemia, unspecified: Secondary | ICD-10-CM

## 2017-10-13 DIAGNOSIS — Z1239 Encounter for other screening for malignant neoplasm of breast: Secondary | ICD-10-CM

## 2017-10-13 MED ORDER — AMLODIPINE BESYLATE 5 MG PO TABS: 5.0000 mg | ORAL_TABLET | Freq: Every day | ORAL | 3 refills | Status: DC

## 2017-10-13 NOTE — Telephone Encounter (Signed)
Gastroenterology Pre-Procedure Review  Request Date: 11/17/17   [facility] Requesting Physician: Dr. Bonna Gains  PATIENT REVIEW QUESTIONS: The patient responded to the following health history questions as indicated:    1. Are you having any GI issues? no 2. Do you have a personal history of Polyps? no 3. Do you have a family history of Colon Cancer or Polyps? yes (Mother polyps, brothers polyps) 4. Diabetes Mellitus? no 5. Joint replacements in the past 12 months?no 6. Major health problems in the past 3 months?no 7. Any artificial heart valves, MVP, or defibrillator?no    MEDICATIONS & ALLERGIES:    Patient reports the following regarding taking any anticoagulation/antiplatelet therapy:   Plavix, Coumadin, Eliquis, Xarelto, Lovenox, Pradaxa, Brilinta, or Effient? no Aspirin? no  Patient confirms/reports the following medications:  Current Outpatient Medications  Medication Sig Dispense Refill  . amLODipine (NORVASC) 5 MG tablet Take 1 tablet (5 mg total) by mouth daily. 90 tablet 3  . doxepin (SINEQUAN) 10 MG capsule Take 1-2 capsules (10-20 mg total) by mouth at bedtime as needed. For sleep 60 capsule 0  . lamoTRIgine (LAMICTAL) 150 MG tablet Take 1 tablet (150 mg total) by mouth daily. 30 tablet 1   No current facility-administered medications for this visit.     Patient confirms/reports the following allergies:  No Known Allergies  No orders of the defined types were placed in this encounter.   AUTHORIZATION INFORMATION Primary Insurance: 1D#: Group #:  Secondary Insurance: 1D#: Group #:  SCHEDULE INFORMATION: Date: 11/17/17     [place] Time: Location: [facility]

## 2017-10-13 NOTE — Assessment & Plan Note (Signed)
New diagnosis Patient with strong famhx HTN Will start amlodipine 25m daily Screening lipid panel Recent met panel reviewed F/u in 3 months

## 2017-10-13 NOTE — Patient Instructions (Signed)
Preventive Care 40-64 Years, Female Preventive care refers to lifestyle choices and visits with your health care provider that can promote health and wellness. What does preventive care include?  A yearly physical exam. This is also called an annual well check.  Dental exams once or twice a year.  Routine eye exams. Ask your health care provider how often you should have your eyes checked.  Personal lifestyle choices, including: ? Daily care of your teeth and gums. ? Regular physical activity. ? Eating a healthy diet. ? Avoiding tobacco and drug use. ? Limiting alcohol use. ? Practicing safe sex. ? Taking low-dose aspirin daily starting at age 58. ? Taking vitamin and mineral supplements as recommended by your health care provider. What happens during an annual well check? The services and screenings done by your health care provider during your annual well check will depend on your age, overall health, lifestyle risk factors, and family history of disease. Counseling Your health care provider may ask you questions about your:  Alcohol use.  Tobacco use.  Drug use.  Emotional well-being.  Home and relationship well-being.  Sexual activity.  Eating habits.  Work and work Statistician.  Method of birth control.  Menstrual cycle.  Pregnancy history.  Screening You may have the following tests or measurements:  Height, weight, and BMI.  Blood pressure.  Lipid and cholesterol levels. These may be checked every 5 years, or more frequently if you are over 81 years old.  Skin check.  Lung cancer screening. You may have this screening every year starting at age 78 if you have a 30-pack-year history of smoking and currently smoke or have quit within the past 15 years.  Fecal occult blood test (FOBT) of the stool. You may have this test every year starting at age 65.  Flexible sigmoidoscopy or colonoscopy. You may have a sigmoidoscopy every 5 years or a colonoscopy  every 10 years starting at age 30.  Hepatitis C blood test.  Hepatitis B blood test.  Sexually transmitted disease (STD) testing.  Diabetes screening. This is done by checking your blood sugar (glucose) after you have not eaten for a while (fasting). You may have this done every 1-3 years.  Mammogram. This may be done every 1-2 years. Talk to your health care provider about when you should start having regular mammograms. This may depend on whether you have a family history of breast cancer.  BRCA-related cancer screening. This may be done if you have a family history of breast, ovarian, tubal, or peritoneal cancers.  Pelvic exam and Pap test. This may be done every 3 years starting at age 80. Starting at age 36, this may be done every 5 years if you have a Pap test in combination with an HPV test.  Bone density scan. This is done to screen for osteoporosis. You may have this scan if you are at high risk for osteoporosis.  Discuss your test results, treatment options, and if necessary, the need for more tests with your health care provider. Vaccines Your health care provider may recommend certain vaccines, such as:  Influenza vaccine. This is recommended every year.  Tetanus, diphtheria, and acellular pertussis (Tdap, Td) vaccine. You may need a Td booster every 10 years.  Varicella vaccine. You may need this if you have not been vaccinated.  Zoster vaccine. You may need this after age 5.  Measles, mumps, and rubella (MMR) vaccine. You may need at least one dose of MMR if you were born in  1957 or later. You may also need a second dose.  Pneumococcal 13-valent conjugate (PCV13) vaccine. You may need this if you have certain conditions and were not previously vaccinated.  Pneumococcal polysaccharide (PPSV23) vaccine. You may need one or two doses if you smoke cigarettes or if you have certain conditions.  Meningococcal vaccine. You may need this if you have certain  conditions.  Hepatitis A vaccine. You may need this if you have certain conditions or if you travel or work in places where you may be exposed to hepatitis A.  Hepatitis B vaccine. You may need this if you have certain conditions or if you travel or work in places where you may be exposed to hepatitis B.  Haemophilus influenzae type b (Hib) vaccine. You may need this if you have certain conditions.  Talk to your health care provider about which screenings and vaccines you need and how often you need them. This information is not intended to replace advice given to you by your health care provider. Make sure you discuss any questions you have with your health care provider. Document Released: 03/22/2015 Document Revised: 11/13/2015 Document Reviewed: 12/25/2014 Elsevier Interactive Patient Education  2018 Elsevier Inc.  

## 2017-10-13 NOTE — Assessment & Plan Note (Signed)
Discussed importance of cessation States she is not yet ready to quit

## 2017-10-13 NOTE — Progress Notes (Signed)
Patient: Alexa Newman, Female    DOB: 1961-02-01, 57 y.o.   MRN: [id number] Visit Date: 10/13/2017  Today's Provider: [name], MD   I, Martha Clan, CMA, am acting as scribe for [name], MD.  Chief Complaint  Patient presents with  . Annual Exam   Subjective:    Annual physical exam Alexa Newman is a 57 y.o. female who presents today for health maintenance and complete physical. She feels poorly. "I am hurting terribly." She is experiencing pain in her back and wrist after refurnishing furniture. She reports exercising nonformally. She does stretches in the mornings, and does yard work. She reports she is sleeping poorly. She is taking Doxepin, which allows her to fall asleep easily, but she still experiences frequent awakenings.   Pt has never had a colonoscopy. She is due for a mammogram. She is due for a pap smear. -----------------------------------------------------------------   Review of Systems  Constitutional: Negative.   HENT: Negative.   Eyes: Negative.   Respiratory: Negative.   Cardiovascular: Negative.   Gastrointestinal: Negative.   Endocrine: Negative.   Genitourinary: Negative.   Musculoskeletal: Positive for arthralgias, back pain, gait problem and joint swelling. Negative for myalgias, neck pain and neck stiffness.  Skin: Negative.   Allergic/Immunologic: Negative.   Neurological: Positive for numbness. Negative for dizziness, tremors, seizures, syncope, facial asymmetry, speech difficulty, weakness, light-headedness and headaches.  Hematological: Negative.   Psychiatric/Behavioral: Positive for decreased concentration and sleep disturbance. Negative for agitation, behavioral problems, confusion, dysphoric mood, hallucinations, self-injury and suicidal ideas. The patient is not nervous/anxious and is not hyperactive.     Social History      She  reports that she has been smoking cigarettes.  She has a 33.75 pack-year  smoking history. She has never used smokeless tobacco. She reports that she drinks alcohol. She reports that she has current or past drug history.       Social History   Socioeconomic History  . Marital status: Divorced    Spouse name: Not on file  . Number of children: 1  . Years of education: Not on file  . Highest education level: Some college, no degree  Occupational History  . Occupation: disabled    Comment: on SSI awaiting decision about disability  Social Needs  . Financial resource strain: Hard  . Food insecurity:    Worry: Often true    Inability: Often true  . Transportation needs:    Medical: Yes    Non-medical: Yes  Tobacco Use  . Smoking status: Current Every Day Smoker    Packs/day: 0.75    Years: 45.00    Pack years: 33.75    Types: Cigarettes  . Smokeless tobacco: Never Used  . Tobacco comment: started smoking at age 2  Substance and Sexual Activity  . Alcohol use: Yes    Comment: 1-2 times per month  . Drug use: Not Currently    Comment: former crack cocaine user  . Sexual activity: Yes    Partners: Male    Birth control/protection: Post-menopausal, Surgical  Lifestyle  . Physical activity:    Days per week: 4 days    Minutes per session: 20 min  . Stress: Very much  Relationships  . Social connections:    Talks on phone: More than three times a week    Gets together: Once a week    Attends religious service: Never    Active member of club or organization: No  Attends meetings of clubs or organizations: Never    Relationship status: Married  Other Topics Concern  . Not on file  Social History Narrative  . Not on file    Past Medical History:  Diagnosis Date  . Anxiety   . Arthritis   . Bipolar 1 disorder ([place])   . Depression      Patient Active Problem List   Diagnosis Date Noted  . Heat intolerance 07/13/2017  . Encounter for monitoring chronic NSAID therapy 07/13/2017  . Tobacco abuse 07/13/2017  . Essential hypertension  07/13/2017  . Bipolar 1 disorder ([place])   . Anxiety   . Arthritis     Past Surgical History:  Procedure Laterality Date  . ANTERIOR CRUCIATE LIGAMENT (ACL) REVISION Right   . DILATION AND CURETTAGE OF UTERUS  2013   for heavy bleeding  . ENDOMETRIAL ABLATION  2013  . TUBAL LIGATION    . WRIST ARTHROSCOPY Left 1990   for degenerative changes    Family History        Family Status  Relation Name Status  . Mother  Deceased  . Father  Deceased  . Sister  Alive  . Brother  Alive  . Sister  Alive  . Sister  Alive  . Sister  Alive  . Brother  Alive  . MGF  (Not Specified)  . Neg Hx  (Not Specified)        Her family history includes Alcohol abuse in her brother, brother, and sister; Anxiety disorder in her brother, brother, and sister; Bipolar disorder in her sister; Breast cancer (age of onset: 33) in her sister; Colon polyps in her brother and brother; Congestive Heart Failure in her father; Crohn's disease in her mother; Depression in her brother, brother, mother, and sister; Diabetes in her mother; Drug abuse in her brother, brother, and sister; HIV in her brother and brother; Hypertension in her mother; Lung cancer (age of onset: 81) in her mother; Skin cancer in her maternal grandfather. There is no history of Colon cancer, Ovarian cancer, or Cervical cancer.      No Known Allergies   Current Outpatient Medications:  .  doxepin (SINEQUAN) 10 MG capsule, Take 1-2 capsules (10-20 mg total) by mouth at bedtime as needed. For sleep, Disp: 60 capsule, Rfl: 0 .  lamoTRIgine (LAMICTAL) 150 MG tablet, Take 1 tablet (150 mg total) by mouth daily., Disp: 30 tablet, Rfl: 1 .  amLODipine (NORVASC) 5 MG tablet, Take 1 tablet (5 mg total) by mouth daily., Disp: 90 tablet, Rfl: 3   Patient Care Team: Virginia Crews, MD as PCP - General (Family Medicine)      Objective:   Vitals: BP (!) 148/88 (BP Location: Left Arm, Patient Position: Sitting, Cuff Size: Normal)   Pulse 87    Temp 98.1 F (36.7 C) (Oral)   Resp 16   Ht '5\' 4"'  (1.626 m)   Wt 174 lb (78.9 kg)   LMP 12/07/2011   SpO2 99%   BMI 29.87 kg/m    Vitals:   10/13/17 0911  BP: (!) 148/88  Pulse: 87  Resp: 16  Temp: 98.1 F (36.7 C)  TempSrc: Oral  SpO2: 99%  Weight: 174 lb (78.9 kg)  Height: '5\' 4"'  (1.626 m)     Physical Exam  Constitutional: She is oriented to person, place, and time. She appears well-developed and well-nourished. No distress.  HENT:  Head: Normocephalic and atraumatic.  Right Ear: External ear normal.  Left Ear: External  ear normal.  Nose: Nose normal.  Mouth/Throat: Oropharynx is clear and moist.  Eyes: Pupils are equal, round, and reactive to light. Conjunctivae and EOM are normal. No scleral icterus.  Neck: Neck supple. No thyromegaly present.  Cardiovascular: Normal rate, regular rhythm, normal heart sounds and intact distal pulses.  No murmur heard. Pulmonary/Chest: Effort normal and breath sounds normal. No respiratory distress. She has no wheezes. She has no rales.  Abdominal: Soft. Bowel sounds are normal. She exhibits no distension. There is no tenderness. There is no rebound and no guarding.  Genitourinary:  Genitourinary Comments: Breasts: breasts appear normal, no suspicious masses, no skin or nipple changes or axillary nodes. GYN:  External genitalia within normal limits.  Vaginal mucosa pink, moist, normal rugae.  Nonfriable cervix without lesions, no discharge or bleeding noted on speculum exam.  Bimanual exam revealed normal, nongravid uterus.  No cervical motion tenderness. No adnexal masses bilaterally.     Musculoskeletal: She exhibits no edema or deformity.  Lymphadenopathy:    She has no cervical adenopathy.  Neurological: She is alert and oriented to person, place, and time.  Skin: Skin is warm and dry. Capillary refill takes less than 2 seconds. No rash noted.  Psychiatric: She has a normal mood and affect. Her behavior is normal.  Vitals  reviewed.    Depression Screen PHQ 2/9 Scores 07/13/2017  PHQ - 2 Score 2  PHQ- 9 Score 11     Assessment & Plan:     Routine Health Maintenance and Physical Exam  Exercise Activities and Dietary recommendations Goals    None      Immunization History  Administered Date(s) Administered  . Tdap 09/20/2009    Health Maintenance  Topic Date Due  . Hepatitis C Screening  07/20/61  . HIV Screening  03/09/1976  . PAP SMEAR  03/09/1982  . MAMMOGRAM  03/10/2011  . COLONOSCOPY  03/10/2011  . INFLUENZA VACCINE  10/07/2017  . TETANUS/TDAP  09/21/2019     Discussed health benefits of physical activity, and encouraged her to engage in regular exercise appropriate for her age and condition.    -------------------------------------------------------------------- Problem List Items Addressed This Visit      Cardiovascular and Mediastinum   Essential hypertension    New diagnosis Patient with strong famhx HTN Will start amlodipine 11m daily Screening lipid panel Recent met panel reviewed F/u in 3 months      Relevant Medications   amLODipine (NORVASC) 5 MG tablet     Other   Tobacco abuse    Discussed importance of cessation States she is not yet ready to quit       Other Visit Diagnoses    Encounter for annual physical exam    -  Primary   Need for hepatitis C screening test       Relevant Orders   Hepatitis C Antibody   Screening for HIV (human immunodeficiency virus)       Relevant Orders   HIV antibody (with reflex)   Screening for breast cancer       Relevant Orders   MS DIGITAL SCREENING TOMO BILATERAL   Screen for colon cancer       Relevant Orders   Ambulatory referral to Gastroenterology   Family history of colonic polyps       Hyperlipidemia, unspecified hyperlipidemia type       Relevant Medications   amLODipine (NORVASC) 5 MG tablet   Other Relevant Orders   Lipid panel   Cervical cancer screening  Relevant Orders   Cytology - PAP         Return in about 3 months (around 01/13/2018) for BP f/u.   The entirety of the information documented in the History of Present Illness, Review of Systems and Physical Exam were personally obtained by me. Portions of this information were initially documented by Raquel Sarna Ratchford, CMA and reviewed by me for thoroughness and accuracy.    Virginia Crews, MD, MPH [facility] 10/13/2017 10:26 AM

## 2017-10-14 ENCOUNTER — Other Ambulatory Visit: Payer: Self-pay

## 2017-10-14 ENCOUNTER — Telehealth: Payer: Self-pay

## 2017-10-14 DIAGNOSIS — Z1211 Encounter for screening for malignant neoplasm of colon: Secondary | ICD-10-CM

## 2017-10-14 LAB — LIPID PANEL
CHOLESTEROL TOTAL: 212 mg/dL — AB (ref 100–199)
Chol/HDL Ratio: 3.6 ratio (ref 0.0–4.4)
HDL: 59 mg/dL (ref >39)
LDL Calculated: 128 mg/dL — ABNORMAL HIGH (ref 0–99)
TRIGLYCERIDES: 123 mg/dL (ref 0–149)
VLDL Cholesterol Cal: 25 mg/dL (ref 5–40)

## 2017-10-14 LAB — HIV ANTIBODY (ROUTINE TESTING W REFLEX): HIV Screen 4th Generation wRfx: NONREACTIVE

## 2017-10-14 LAB — CYTOLOGY - PAP
Diagnosis: NEGATIVE
HPV (WINDOPATH): NOT DETECTED

## 2017-10-14 LAB — HEPATITIS C ANTIBODY: HEP C VIRUS AB: 0.3 {s_co_ratio} (ref 0.0–0.9)

## 2017-10-14 NOTE — Telephone Encounter (Signed)
Left message to call back  

## 2017-10-14 NOTE — Telephone Encounter (Signed)
Pt advised.   Thanks,   -Laura  

## 2017-10-14 NOTE — Telephone Encounter (Signed)
Pt advised and agrees with treatment plan. 

## 2017-10-14 NOTE — Telephone Encounter (Signed)
-----   Message from Virginia Crews, MD sent at 10/14/2017  3:14 PM EDT ----- Normal pap smear.  Negative HPV.  Repeat in 5 years  Bacigalupo, Dionne Bucy, MD, MPH [facility] 10/14/2017 3:14 PM

## 2017-10-14 NOTE — Telephone Encounter (Signed)
-----   Message from Virginia Crews, MD sent at 10/14/2017  8:23 AM EDT ----- Negative Hep C and HIV screening.  Cholesterol is high, but in an ok range.  No need for medication at this time.  Recommend diet low in saturated fat and exercise (35min 5d/wk)  Bacigalupo, Dionne Bucy, MD, MPH [facility] 10/14/2017 8:23 AM

## 2017-10-28 DIAGNOSIS — G5603 Carpal tunnel syndrome, bilateral upper limbs: Secondary | ICD-10-CM | POA: Diagnosis not present

## 2017-10-28 DIAGNOSIS — M25331 Other instability, right wrist: Secondary | ICD-10-CM | POA: Diagnosis not present

## 2017-10-28 DIAGNOSIS — M25531 Pain in right wrist: Secondary | ICD-10-CM | POA: Diagnosis not present

## 2017-10-28 DIAGNOSIS — M19131 Post-traumatic osteoarthritis, right wrist: Secondary | ICD-10-CM | POA: Diagnosis not present

## 2017-11-09 ENCOUNTER — Ambulatory Visit: Payer: Medicaid Other | Admitting: Licensed Clinical Social Worker

## 2017-11-09 ENCOUNTER — Ambulatory Visit: Payer: Medicaid Other | Admitting: Psychiatry

## 2017-11-09 DIAGNOSIS — G5601 Carpal tunnel syndrome, right upper limb: Secondary | ICD-10-CM | POA: Diagnosis not present

## 2017-11-09 DIAGNOSIS — M19039 Primary osteoarthritis, unspecified wrist: Secondary | ICD-10-CM | POA: Diagnosis not present

## 2017-11-16 ENCOUNTER — Encounter: Payer: Self-pay | Admitting: *Deleted

## 2017-11-17 ENCOUNTER — Ambulatory Visit: Payer: Medicaid Other | Admitting: Anesthesiology

## 2017-11-17 ENCOUNTER — Encounter: Payer: Self-pay | Admitting: *Deleted

## 2017-11-17 ENCOUNTER — Ambulatory Visit
Admission: RE | Admit: 2017-11-17 | Discharge: 2017-11-17 | Disposition: A | Discharge status: Home / Self Care | Payer: Medicaid Other | Source: Ambulatory Visit | Attending: Gastroenterology | Admitting: Gastroenterology

## 2017-11-17 ENCOUNTER — Encounter
Admission: RE | Disposition: A | Discharge status: Home / Self Care | Payer: Self-pay | Source: Ambulatory Visit | Attending: Gastroenterology

## 2017-11-17 DIAGNOSIS — Z79899 Other long term (current) drug therapy: Secondary | ICD-10-CM | POA: Diagnosis not present

## 2017-11-17 DIAGNOSIS — Z1211 Encounter for screening for malignant neoplasm of colon: Secondary | ICD-10-CM

## 2017-11-17 DIAGNOSIS — F419 Anxiety disorder, unspecified: Secondary | ICD-10-CM | POA: Insufficient documentation

## 2017-11-17 DIAGNOSIS — K635 Polyp of colon: Secondary | ICD-10-CM

## 2017-11-17 DIAGNOSIS — D125 Benign neoplasm of sigmoid colon: Secondary | ICD-10-CM | POA: Diagnosis not present

## 2017-11-17 DIAGNOSIS — M1990 Unspecified osteoarthritis, unspecified site: Secondary | ICD-10-CM | POA: Insufficient documentation

## 2017-11-17 DIAGNOSIS — F319 Bipolar disorder, unspecified: Secondary | ICD-10-CM | POA: Insufficient documentation

## 2017-11-17 DIAGNOSIS — F17210 Nicotine dependence, cigarettes, uncomplicated: Secondary | ICD-10-CM | POA: Diagnosis not present

## 2017-11-17 DIAGNOSIS — J449 Chronic obstructive pulmonary disease, unspecified: Secondary | ICD-10-CM | POA: Insufficient documentation

## 2017-11-17 DIAGNOSIS — K6389 Other specified diseases of intestine: Secondary | ICD-10-CM | POA: Diagnosis not present

## 2017-11-17 DIAGNOSIS — D122 Benign neoplasm of ascending colon: Secondary | ICD-10-CM

## 2017-11-17 DIAGNOSIS — F418 Other specified anxiety disorders: Secondary | ICD-10-CM | POA: Diagnosis not present

## 2017-11-17 DIAGNOSIS — K648 Other hemorrhoids: Secondary | ICD-10-CM | POA: Diagnosis not present

## 2017-11-17 DIAGNOSIS — I10 Essential (primary) hypertension: Secondary | ICD-10-CM | POA: Insufficient documentation

## 2017-11-17 HISTORY — DX: Unspecified osteoarthritis, unspecified site: M19.90

## 2017-11-17 HISTORY — PX: COLONOSCOPY WITH PROPOFOL: SHX5780

## 2017-11-17 SURGERY — COLONOSCOPY WITH PROPOFOL
Anesthesia: General

## 2017-11-17 MED ORDER — EPHEDRINE SULFATE 50 MG/ML IJ SOLN
INTRAMUSCULAR | Status: DC | PRN
  Administered 2017-11-17 (×2): 10 mg via INTRAVENOUS

## 2017-11-17 MED ORDER — EPHEDRINE SULFATE 50 MG/ML IJ SOLN
INTRAMUSCULAR | Status: AC
  Filled 2017-11-17: qty 1

## 2017-11-17 MED ORDER — PROPOFOL 500 MG/50ML IV EMUL
INTRAVENOUS | Status: AC
  Filled 2017-11-17: qty 50

## 2017-11-17 MED ORDER — FENTANYL CITRATE (PF) 100 MCG/2ML IJ SOLN
INTRAMUSCULAR | Status: DC | PRN
  Administered 2017-11-17 (×2): 50 ug via INTRAVENOUS

## 2017-11-17 MED ORDER — PROPOFOL 500 MG/50ML IV EMUL
INTRAVENOUS | Status: DC | PRN
  Administered 2017-11-17: 120 ug/kg/min via INTRAVENOUS

## 2017-11-17 MED ORDER — SODIUM CHLORIDE 0.9 % IV SOLN
INTRAVENOUS | Status: DC
  Administered 2017-11-17: 1000 mL via INTRAVENOUS

## 2017-11-17 MED ORDER — FENTANYL CITRATE (PF) 100 MCG/2ML IJ SOLN
INTRAMUSCULAR | Status: AC
  Filled 2017-11-17: qty 2

## 2017-11-17 MED ORDER — MIDAZOLAM HCL 2 MG/2ML IJ SOLN
INTRAMUSCULAR | Status: AC
  Filled 2017-11-17: qty 2

## 2017-11-17 MED ORDER — MIDAZOLAM HCL 2 MG/2ML IJ SOLN
INTRAMUSCULAR | Status: DC | PRN
  Administered 2017-11-17: 2 mg via INTRAVENOUS

## 2017-11-17 MED ORDER — PROPOFOL 10 MG/ML IV BOLUS
INTRAVENOUS | Status: AC
  Filled 2017-11-17: qty 20

## 2017-11-17 NOTE — Anesthesia Postprocedure Evaluation (Signed)
Anesthesia Post Note  Patient: Alexa Newman  Procedure(s) Performed: COLONOSCOPY WITH PROPOFOL (N/A )  Patient location during evaluation: Endoscopy Anesthesia Type: General Level of consciousness: awake and alert Pain management: pain level controlled Vital Signs Assessment: post-procedure vital signs reviewed and stable Respiratory status: spontaneous breathing, nonlabored ventilation, respiratory function stable and patient connected to nasal cannula oxygen Cardiovascular status: blood pressure returned to baseline and stable Postop Assessment: no apparent nausea or vomiting Anesthetic complications: no     Last Vitals:  Vitals:   11/17/17 1236 11/17/17 1246  BP: 109/74 128/78  Pulse: 74 79  Resp: 20 20  Temp:    SpO2: 100% 100%    Last Pain:  Vitals:   11/17/17 1246  TempSrc:   PainSc: 0-No pain                 Precious Haws [name]

## 2017-11-17 NOTE — Op Note (Signed)
[facility] Patient Name: Alexa Newman Procedure Date: 11/17/2017 11:18 AM MRN: [id number] Account #: [id number] Date of Birth: Mar 10, 1960 Admit Type: Outpatient Age: 57 Room: [facility] ENDO ROOM 4 Gender: Female Note Status: Finalized Procedure:            Colonoscopy Indications:          Screening for colorectal malignant neoplasm Providers:            Jaymison Luber B. Bonna Gains MD, MD Referring MD:         Dionne Bucy. Bacigalupo (Referring MD) Medicines:            Monitored Anesthesia Care Complications:        No immediate complications. Procedure:            Pre-Anesthesia Assessment:                       - ASA Grade Assessment: II - A patient with mild                        systemic disease.                       - Prior to the procedure, a History and Physical was                        performed, and patient medications, allergies and                        sensitivities were reviewed. The patient's tolerance of                        previous anesthesia was reviewed.                       - The risks and benefits of the procedure and the                        sedation options and risks were discussed with the                        patient. All questions were answered and informed                        consent was obtained.                       - Patient identification and proposed procedure were                        verified prior to the procedure by the physician, the                        nurse, the anesthesiologist, the anesthetist and the                        technician. The procedure was verified in the procedure                        room.  After obtaining informed consent, the colonoscope was                        passed under direct vision. Throughout the procedure,                        the patient's blood pressure, pulse, and oxygen                        saturations were monitored continuously. The                       Colonoscope was introduced through the anus and                        advanced to the the cecum, identified by appendiceal                        orifice and ileocecal valve. The colonoscopy was                        performed with ease. The patient tolerated the                        procedure well. The quality of the bowel preparation                        was good. Findings:      The perianal and digital rectal examinations were normal.      A 7 mm polyp was found in the ascending colon. The polyp was sessile.       The polyp was removed with a cold snare. Resection and retrieval were       complete. To prevent bleeding post-intervention, one hemostatic clip was       successfully placed. There was no bleeding at the end of the procedure.      A localized area of mildly thickened folds of the mucosa was found at       the hepatic flexure. Biopsies were taken with a cold forceps for       histology.      A 4 mm polyp was found in the sigmoid colon. The polyp was flat. The       polyp was removed with a cold biopsy forceps. Resection and retrieval       were complete.      The exam was otherwise without abnormality.      The rectum, sigmoid colon, descending colon, transverse colon, ascending       colon and cecum appeared normal.      Internal hemorrhoids were found during retroflexion.      No additional abnormalities were found on retroflexion. Impression:           - One 7 mm polyp in the ascending colon, removed with a                        cold snare. Resected and retrieved. Clip was placed.                       - Thickened folds of the mucosa at the hepatic flexure.  Biopsied.                       - One 4 mm polyp in the sigmoid colon, removed with a                        cold biopsy forceps. Resected and retrieved.                       - The examination was otherwise normal.                       - The rectum, sigmoid colon,  descending colon,                        transverse colon, ascending colon and cecum are normal.                       - Internal hemorrhoids. Recommendation:       - Discharge patient to home (with escort).                       - Advance diet as tolerated.                       - Continue present medications.                       - Await pathology results.                       - Repeat colonoscopy in 5 years for surveillance.                       - The findings and recommendations were discussed with                        the patient.                       - The findings and recommendations were discussed with                        the patient's family.                       - Return to primary care physician as previously                        scheduled.                       - High fiber diet. Procedure Code(s):    --- Professional ---                       [phone], Colonoscopy, flexible; with removal of tumor(s),                        polyp(s), or other lesion(s) by snare technique                       42595, 59, Colonoscopy, flexible; with biopsy, single  or multiple Diagnosis Code(s):    --- Professional ---                       Z12.11, Encounter for screening for malignant neoplasm                        of colon                       D12.2, Benign neoplasm of ascending colon                       D12.5, Benign neoplasm of sigmoid colon                       K63.89, Other specified diseases of intestine                       K64.8, Other hemorrhoids CPT copyright 2017 American Medical Association. All rights reserved. The codes documented in this report are preliminary and upon coder review may  be revised to meet current compliance requirements.  [name], MD Margretta Sidle B. Bonna Gains MD, MD 11/17/2017 12:25:16 PM This report has been signed electronically. Number of Addenda: 0 Note Initiated On: 11/17/2017 11:18 AM Scope Withdrawal Time: 0  hours 32 minutes 31 seconds  Total Procedure Duration: 0 hours 46 minutes 0 seconds  Estimated Blood Loss: Estimated blood loss: none.      [facility]

## 2017-11-17 NOTE — Transfer of Care (Signed)
Immediate Anesthesia Transfer of Care Note  Patient: Alexa Newman  Procedure(s) Performed: COLONOSCOPY WITH PROPOFOL (N/A )  Patient Location: PACU  Anesthesia Type:MAC and General  Level of Consciousness: awake and alert   Airway & Oxygen Therapy: Patient Spontanous Breathing and Patient connected to nasal cannula oxygen  Post-op Assessment: Report given to RN and Post -op Vital signs reviewed and stable  Post vital signs: Reviewed and stable  Last Vitals:  Vitals Value Taken Time  BP    Temp    Pulse    Resp    SpO2      Last Pain:  Vitals:   11/17/17 1113  TempSrc: Tympanic  PainSc: 0-No pain         Complications: No apparent anesthesia complications

## 2017-11-17 NOTE — H&P (Signed)
Alexa Antigua, MD [address], [place], [place], [place], [address] [address], [place], [place], [place], [address] Phone: [phone]  Fax: [phone]  Primary Care Physician:  Virginia Crews, MD   Pre-Procedure History & Physical: HPI:  Alexa Newman is a 57 y.o. female is here for a colonoscopy.   Past Medical History:  Diagnosis Date  . Anxiety   . Arthritis   . Bipolar 1 disorder ([place])   . Depression   . Osteoarthritis     Past Surgical History:  Procedure Laterality Date  . ANTERIOR CRUCIATE LIGAMENT (ACL) REVISION Right   . DILATION AND CURETTAGE OF UTERUS  2013   for heavy bleeding  . ENDOMETRIAL ABLATION  2013  . TUBAL LIGATION    . WRIST ARTHROSCOPY Left 1990   for degenerative changes    Prior to Admission medications   Medication Sig Start Date End Date Taking? Authorizing Provider  meloxicam (MOBIC) 15 MG tablet Take 15 mg by mouth daily.   Yes [provider]  amLODipine (NORVASC) 5 MG tablet Take 1 tablet (5 mg total) by mouth daily. 10/13/17   Bacigalupo, Dionne Bucy, MD  doxepin (SINEQUAN) 10 MG capsule Take 1-2 capsules (10-20 mg total) by mouth at bedtime as needed. For sleep 08/24/17   Ursula Alert, MD  lamoTRIgine (LAMICTAL) 150 MG tablet Take 1 tablet (150 mg total) by mouth daily. 10/12/17   Ursula Alert, MD    Allergies as of 10/14/2017  . (No Known Allergies)    Family History  Problem Relation Age of Onset  . Diabetes Mother   . Depression Mother   . Hypertension Mother   . Crohn's disease Mother   . Lung cancer Mother 13       former smoker  . Congestive Heart Failure Father   . Breast cancer Sister 53  . Alcohol abuse Sister   . Drug abuse Sister   . Anxiety disorder Sister   . Depression Sister   . Bipolar disorder Sister   . Alcohol abuse Brother   . Drug abuse Brother   . Anxiety disorder Brother   . Depression Brother   . Colon polyps Brother   . HIV Brother   . Alcohol abuse Brother   .  Drug abuse Brother   . Anxiety disorder Brother   . Depression Brother   . Colon polyps Brother   . HIV Brother   . Skin cancer Maternal Grandfather   . Colon cancer Neg Hx   . Ovarian cancer Neg Hx   . Cervical cancer Neg Hx     Social History   Socioeconomic History  . Marital status: Divorced    Spouse name: Not on file  . Number of children: 1  . Years of education: Not on file  . Highest education level: Some college, no degree  Occupational History  . Occupation: disabled    Comment: on SSI awaiting decision about disability  Social Needs  . Financial resource strain: Hard  . Food insecurity:    Worry: Often true    Inability: Often true  . Transportation needs:    Medical: Yes    Non-medical: Yes  Tobacco Use  . Smoking status: Current Every Day Smoker    Packs/day: 0.75    Years: 45.00    Pack years: 33.75    Types: Cigarettes  . Smokeless tobacco: Never Used  . Tobacco comment: started smoking at age 43  Substance and Sexual Activity  . Alcohol  use: Yes    Comment: 1-2 times per month  . Drug use: Not Currently    Comment: former crack cocaine user  . Sexual activity: Yes    Partners: Male    Birth control/protection: Post-menopausal, Surgical  Lifestyle  . Physical activity:    Days per week: 4 days    Minutes per session: 20 min  . Stress: Very much  Relationships  . Social connections:    Talks on phone: More than three times a week    Gets together: Once a week    Attends religious service: Never    Active member of club or organization: No    Attends meetings of clubs or organizations: Never    Relationship status: Married  . Intimate partner violence:    Fear of current or ex partner: No    Emotionally abused: No    Physically abused: No    Forced sexual activity: No  Other Topics Concern  . Not on file  Social History Narrative  . Not on file    Review of Systems: See HPI, otherwise negative ROS  Physical Exam: LMP 12/07/2011    General:   Alert,  pleasant and cooperative in NAD Head:  Normocephalic and atraumatic. Neck:  Supple; no masses or thyromegaly. Lungs:  Clear throughout to auscultation, normal respiratory effort.    Heart:  +S1, +S2, Regular rate and rhythm, No edema. Abdomen:  Soft, nontender and nondistended. Normal bowel sounds, without guarding, and without rebound.   Neurologic:  Alert and  oriented x4;  grossly normal neurologically.  Impression/Plan: [name] is here for a colonoscopy to be performed for average risk screening.  Risks, benefits, limitations, and alternatives regarding  colonoscopy have been reviewed with the patient.  Questions have been answered.  All parties agreeable.   Virgel Manifold, MD  11/17/2017, 11:15 AM

## 2017-11-17 NOTE — Anesthesia Preprocedure Evaluation (Signed)
Anesthesia Evaluation  Patient identified by MRN, date of birth, ID band Patient awake    Reviewed: Allergy & Precautions, H&P , NPO status , Patient's Chart, lab work & pertinent test results  History of Anesthesia Complications (+) PONV and history of anesthetic complications  Airway Mallampati: III  TM Distance: >3 FB Neck ROM: limited    Dental  (+) Chipped, Poor Dentition, Upper Dentures   Pulmonary neg shortness of breath, COPD, Current Smoker,           Cardiovascular Exercise Tolerance: Good hypertension, (-) angina(-) Past MI and (-) DOE      Neuro/Psych PSYCHIATRIC DISORDERS Anxiety Depression Bipolar Disorder negative neurological ROS     GI/Hepatic negative GI ROS, Neg liver ROS, neg GERD  ,  Endo/Other  negative endocrine ROS  Renal/GU negative Renal ROS  negative genitourinary   Musculoskeletal  (+) Arthritis ,   Abdominal   Peds  Hematology negative hematology ROS (+)   Anesthesia Other Findings Past Medical History: No date: Anxiety No date: Arthritis No date: Bipolar 1 disorder (HCC) No date: Depression No date: Osteoarthritis  Past Surgical History: No date: ANTERIOR CRUCIATE LIGAMENT (ACL) REVISION; Right 2013: DILATION AND CURETTAGE OF UTERUS     Comment:  for heavy bleeding 2013: ENDOMETRIAL ABLATION No date: TUBAL LIGATION 1990: WRIST ARTHROSCOPY; Left     Comment:  for degenerative changes     Reproductive/Obstetrics negative OB ROS                             Anesthesia Physical Anesthesia Plan  ASA: III  Anesthesia Plan: General   Post-op Pain Management:    Induction: Intravenous  PONV Risk Score and Plan: Propofol infusion and TIVA  Airway Management Planned: Natural Airway and Nasal Cannula  Additional Equipment:   Intra-op Plan:   Post-operative Plan:   Informed Consent: I have reviewed the patients History and Physical, chart,  labs and discussed the procedure including the risks, benefits and alternatives for the proposed anesthesia with the patient or authorized representative who has indicated his/her understanding and acceptance.   Dental Advisory Given  Plan Discussed with: Anesthesiologist, CRNA and Surgeon  Anesthesia Plan Comments: (Patient consented for risks of anesthesia including but not limited to:  - adverse reactions to medications - risk of intubation if required - damage to teeth, lips or other oral mucosa - sore throat or hoarseness - Damage to heart, brain, lungs or loss of life  Patient voiced understanding.)        Anesthesia Quick Evaluation

## 2017-11-17 NOTE — Anesthesia Procedure Notes (Signed)
Performed by: Cook[place]Martin, [place] Pre-anesthesia Checklist: Patient identified, Emergency Drugs available, Suction available, Patient being monitored and Timeout performed Patient Re-evaluated:Patient Re-evaluated prior to induction Oxygen Delivery Method: Nasal cannula Preoxygenation: Pre-oxygenation with 100% oxygen Induction Type: IV induction Placement Confirmation: positive ETCO2 and CO2 detector       

## 2017-11-17 NOTE — Anesthesia Post-op Follow-up Note (Signed)
Anesthesia QCDR form completed.        

## 2017-11-19 LAB — SURGICAL PATHOLOGY

## 2017-11-22 ENCOUNTER — Encounter: Payer: Self-pay | Admitting: Gastroenterology

## 2017-12-06 DIAGNOSIS — G5601 Carpal tunnel syndrome, right upper limb: Secondary | ICD-10-CM | POA: Diagnosis not present

## 2017-12-06 DIAGNOSIS — M19031 Primary osteoarthritis, right wrist: Secondary | ICD-10-CM | POA: Diagnosis not present

## 2017-12-06 DIAGNOSIS — G8918 Other acute postprocedural pain: Secondary | ICD-10-CM | POA: Diagnosis not present

## 2017-12-21 DIAGNOSIS — G5601 Carpal tunnel syndrome, right upper limb: Secondary | ICD-10-CM | POA: Diagnosis not present

## 2017-12-27 ENCOUNTER — Other Ambulatory Visit: Payer: Self-pay | Admitting: Psychiatry

## 2017-12-27 DIAGNOSIS — F3162 Bipolar disorder, current episode mixed, moderate: Secondary | ICD-10-CM

## 2018-01-04 DIAGNOSIS — M7989 Other specified soft tissue disorders: Secondary | ICD-10-CM | POA: Diagnosis not present

## 2018-01-04 DIAGNOSIS — M19039 Primary osteoarthritis, unspecified wrist: Secondary | ICD-10-CM | POA: Diagnosis not present

## 2018-01-04 DIAGNOSIS — M19031 Primary osteoarthritis, right wrist: Secondary | ICD-10-CM | POA: Diagnosis not present

## 2018-01-12 NOTE — Progress Notes (Signed)
Patient: Alexa Newman Female    DOB: Sep 15, 1960   57 y.o.   MRN: [id number] Visit Date: 01/13/2018  Today's Provider: [name], MD   Chief Complaint  Patient presents with  . Hypertension   Subjective:    HPI  Hypertension, follow-up:  BP Readings from Last 3 Encounters:  01/13/18 138/89  11/17/17 128/78  10/13/17 (!) 148/88    She was last seen for hypertension 3 months ago.  BP at that visit was 148/88. Management since that visit includes started Amlodipine 5 mg .She reports poor compliance with treatment. Patient states she is not taking medication. She states she was experiencing dizziness while taking Amlodipine, and when she checks her BP at home it is "normal" She is not having side effects.  She is not exercising regularly, but stays active. She is adherent to low salt diet.   Outside blood pressures are being checked. She is experiencing none.  Patient denies chest pain, chest pressure/discomfort, claudication, dyspnea, exertional chest pressure/discomfort, fatigue, irregular heart beat, lower extremity edema, near-syncope, orthopnea, palpitations, paroxysmal nocturnal dyspnea, syncope and tachypnea.   Cardiovascular risk factors include dyslipidemia, hypertension and smoking/ tobacco exposure.  Use of agents associated with hypertension: none.   ------------------------------------------------------------------------   Lipid/Cholesterol, Follow-up:   Last seen for this 3 months ago.  Management since that visit includes advised to low saturated fats in diet and exercise.  Last Lipid Panel:    Component Value Date/Time   CHOL 212 (H) 10/13/2017 1006   TRIG 123 10/13/2017 1006   HDL 59 10/13/2017 1006   CHOLHDL 3.6 10/13/2017 1006   LDLCALC 128 (H) 10/13/2017 1006    She reports fair compliance with treatment. She is not having side effects.   Wt Readings from Last 3 Encounters:  01/13/18 175 lb 12.8 oz (79.7 kg)  10/13/17 174 lb  (78.9 kg)  07/13/17 178 lb (80.7 kg)    She also states that she is nearly out of her Bipolar meds.  She was supposed to f/u with Psych in September and missed that appt.   ------------------------------------------------------------------------  Allergies  Allergen Reactions  . Pregabalin     Other reaction(s): Other (See Comments) Suicidal ideation     Current Outpatient Medications:  .  amLODipine (NORVASC) 5 MG tablet, Take 1 tablet (5 mg total) by mouth daily., Disp: 90 tablet, Rfl: 3 .  doxepin (SINEQUAN) 10 MG capsule, Take 1-2 capsules (10-20 mg total) by mouth at bedtime as needed. For sleep, Disp: 60 capsule, Rfl: 0 .  lamoTRIgine (LAMICTAL) 150 MG tablet, Take 1 tablet (150 mg total) by mouth daily., Disp: 30 tablet, Rfl: 1 .  meloxicam (MOBIC) 15 MG tablet, Take 15 mg by mouth daily., Disp: , Rfl:   Review of Systems  Constitutional: Negative.   Respiratory: Negative.   Cardiovascular: Negative.   Musculoskeletal: Negative.     Social History   Tobacco Use  . Smoking status: Current Every Day Smoker    Packs/day: 0.75    Years: 45.00    Pack years: 33.75    Types: Cigarettes  . Smokeless tobacco: Never Used  . Tobacco comment: started smoking at age 44  Substance Use Topics  . Alcohol use: Yes    Comment: 1-2 times per month   Objective:   BP 138/89 (BP Location: Left Arm, Patient Position: Sitting, Cuff Size: Normal)   Pulse 89   Temp 98.3 F (36.8 C) (Oral)   Wt 175 lb 12.8 oz (79.7  kg)   LMP 12/07/2011   SpO2 98%   BMI 30.18 kg/m  Vitals:   01/13/18 0807  BP: 138/89  Pulse: 89  Temp: 98.3 F (36.8 C)  TempSrc: Oral  SpO2: 98%  Weight: 175 lb 12.8 oz (79.7 kg)     Physical Exam  Constitutional: She is oriented to person, place, and time. She appears well-developed and well-nourished. No distress.  HENT:  Head: Normocephalic and atraumatic.  Mouth/Throat: Oropharynx is clear and moist.  Eyes: Conjunctivae are normal. No scleral  icterus.  Neck: Neck supple. No thyromegaly present.  Cardiovascular: Normal rate, regular rhythm, normal heart sounds and intact distal pulses.  No murmur heard. Pulmonary/Chest: Effort normal and breath sounds normal. No respiratory distress. She has no wheezes. She has no rales.  Musculoskeletal: She exhibits no edema.  Lymphadenopathy:    She has no cervical adenopathy.  Neurological: She is alert and oriented to person, place, and time.  Skin: Skin is warm and dry. Capillary refill takes less than 2 seconds. No rash noted.  Psychiatric: She has a normal mood and affect. Her behavior is normal.  Vitals reviewed.       Assessment & Plan:   Problem List Items Addressed This Visit      Cardiovascular and Mediastinum   Essential hypertension - Primary    Well controlled, but would like to see it lower States that it is lower at home Will continue amlodipine 5 mg daily Reviewed recent BMP        Other   Bipolar 1 disorder ([place])    Advised to f/u with Psych If necessary, could give 73m supply of lamictal to hold her over to next appt       Other Visit Diagnoses    Need for influenza vaccination       Relevant Orders   Flu Vaccine QUAD 6+ mos PF IM (Fluarix Quad PF) (Completed)       Return in about 3 months (around 04/15/2018) for BP f/u.   Approximately 25 minutes was spent in discussion of which greater than 50% was consultation.     The entirety of the information documented in the History of Present Illness, Review of Systems and Physical Exam were personally obtained by me. Portions of this information were initially documented by Melvia Heaps, CMA and reviewed by me for thoroughness and accuracy.    Virginia Crews, MD, MPH [facility] 01/14/2018 1:07 PM

## 2018-01-13 ENCOUNTER — Telehealth: Payer: Self-pay | Admitting: Psychiatry

## 2018-01-13 ENCOUNTER — Encounter: Payer: Self-pay | Admitting: Family Medicine

## 2018-01-13 ENCOUNTER — Ambulatory Visit (INDEPENDENT_AMBULATORY_CARE_PROVIDER_SITE_OTHER): Payer: Medicaid Other | Admitting: Family Medicine

## 2018-01-13 VITALS — BP 138/89 | HR 89 | Temp 98.3°F | Wt 175.8 lb

## 2018-01-13 DIAGNOSIS — I10 Essential (primary) hypertension: Secondary | ICD-10-CM | POA: Diagnosis not present

## 2018-01-13 DIAGNOSIS — F319 Bipolar disorder, unspecified: Secondary | ICD-10-CM

## 2018-01-13 DIAGNOSIS — Z23 Encounter for immunization: Secondary | ICD-10-CM | POA: Diagnosis not present

## 2018-01-13 NOTE — Telephone Encounter (Signed)
Patient told her PMD she has been unable to make an appointment with [place]. Last seen in august and at that time was advised to be seen in 4 weeks. Will ask Janett Billow CMA to schedule an appointment.

## 2018-01-13 NOTE — Patient Instructions (Signed)

## 2018-01-13 NOTE — Telephone Encounter (Signed)
-----   Message from Virginia Crews, MD sent at 01/13/2018  8:24 AM EST ----- Hi Dr Shea Evans,  Our mutual patient, Alexa Newman, states that she has tried to call and schedule a follow-up appointment with you for her Bipolar Disorder.  She is nearly out of her medications.  Would it be possible to get her seen for follow-up?  Thank you,  [name], MD

## 2018-01-14 NOTE — Assessment & Plan Note (Signed)
Well controlled, but would like to see it lower States that it is lower at home Will continue amlodipine 5 mg daily Reviewed recent BMP

## 2018-01-14 NOTE — Assessment & Plan Note (Signed)
Advised to f/u with Psych If necessary, could give 49m supply of lamictal to hold her over to next appt

## 2018-01-17 NOTE — Telephone Encounter (Signed)
Pt was given an appt for tomorrow afternoon. For medication refills.

## 2018-01-18 ENCOUNTER — Other Ambulatory Visit: Payer: Self-pay

## 2018-01-18 ENCOUNTER — Encounter: Payer: Self-pay | Admitting: Psychiatry

## 2018-01-18 ENCOUNTER — Ambulatory Visit (INDEPENDENT_AMBULATORY_CARE_PROVIDER_SITE_OTHER): Payer: Medicaid Other | Admitting: Psychiatry

## 2018-01-18 VITALS — BP 144/87 | HR 96 | Temp 98.3°F | Wt 179.4 lb

## 2018-01-18 DIAGNOSIS — Z8659 Personal history of other mental and behavioral disorders: Secondary | ICD-10-CM

## 2018-01-18 DIAGNOSIS — F5105 Insomnia due to other mental disorder: Secondary | ICD-10-CM | POA: Diagnosis not present

## 2018-01-18 DIAGNOSIS — F1421 Cocaine dependence, in remission: Secondary | ICD-10-CM

## 2018-01-18 DIAGNOSIS — F3162 Bipolar disorder, current episode mixed, moderate: Secondary | ICD-10-CM | POA: Diagnosis not present

## 2018-01-18 DIAGNOSIS — F17200 Nicotine dependence, unspecified, uncomplicated: Secondary | ICD-10-CM | POA: Diagnosis not present

## 2018-01-18 DIAGNOSIS — Z634 Disappearance and death of family member: Secondary | ICD-10-CM | POA: Diagnosis not present

## 2018-01-18 MED ORDER — LAMOTRIGINE 150 MG PO TABS: 150.0000 mg | ORAL_TABLET | Freq: Every day | ORAL | 2 refills | Status: DC

## 2018-01-18 MED ORDER — DOXEPIN HCL 10 MG PO CAPS: 10.0000 mg | ORAL_CAPSULE | Freq: Every evening | ORAL | 2 refills | Status: DC | PRN
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
## 2018-01-18 NOTE — Telephone Encounter (Signed)
Thanks Jess!

## 2018-01-24 ENCOUNTER — Other Ambulatory Visit: Payer: Self-pay

## 2018-01-24 ENCOUNTER — Ambulatory Visit: Payer: Medicaid Other | Attending: Orthopedic Surgery | Admitting: Occupational Therapy

## 2018-01-24 ENCOUNTER — Encounter: Payer: Self-pay | Admitting: Occupational Therapy

## 2018-01-24 DIAGNOSIS — M25641 Stiffness of right hand, not elsewhere classified: Secondary | ICD-10-CM | POA: Diagnosis not present

## 2018-01-24 DIAGNOSIS — L905 Scar conditions and fibrosis of skin: Secondary | ICD-10-CM | POA: Diagnosis not present

## 2018-01-24 DIAGNOSIS — M6281 Muscle weakness (generalized): Secondary | ICD-10-CM

## 2018-01-24 DIAGNOSIS — M25631 Stiffness of right wrist, not elsewhere classified: Secondary | ICD-10-CM | POA: Diagnosis not present

## 2018-01-24 DIAGNOSIS — R208 Other disturbances of skin sensation: Secondary | ICD-10-CM | POA: Diagnosis not present

## 2018-01-24 NOTE — Patient Instructions (Signed)
Heat   scar massage and use of cica scar pad for night time use with tubi grip   AROM for wrist flexion , ext, RD, UD - pain free -slight pull  10 reps  3 x day  Can add some gentle PROM to wrist ext, flexion ,UD and RD in 3 days if no increase pain  Same Slight pull - less than 1-2/10  2-3 x day prior to AROM   Built up handles of her tools - fatter , larger grips  Avoid vibration - like her sander  Wear brace with heavy or painfullact  Can wean into Benik neoprene with some of act

## 2018-01-24 NOTE — Therapy (Signed)
[place] [facility] [address] [place], [address] Phone: [phone]   Fax:  [phone]  Occupational Therapy Evaluation  Patient Details  Name: Alexa Newman MRN: [id number] Date of Birth: 09/08/1960 Referring Provider (OT): Carlynn Spry Date: 01/24/2018  OT End of Session - 01/24/18 0859    Visit Number  1    Number of Visits  6    Date for OT Re-Evaluation  03/07/18    OT Start Time  0805    OT Stop Time  0850    OT Time Calculation (min)  45 min    Activity Tolerance  Patient tolerated treatment well    Behavior During Therapy  [facility] for tasks assessed/performed       Past Medical History:  Diagnosis Date  . Anxiety   . Arthritis   . Bipolar 1 disorder ([place])   . Depression   . Osteoarthritis     Past Surgical History:  Procedure Laterality Date  . ANTERIOR CRUCIATE LIGAMENT (ACL) REVISION Right   . CARPAL TUNNEL RELEASE    . COLONOSCOPY    . COLONOSCOPY WITH PROPOFOL N/A 11/17/2017   Procedure: COLONOSCOPY WITH PROPOFOL;  Surgeon: Virgel Manifold, MD;  Location: [facility] ENDOSCOPY;  Service: Endoscopy;  Laterality: N/A;  . DILATION AND CURETTAGE OF UTERUS  2013   for heavy bleeding  . ENDOMETRIAL ABLATION  2013  . TUBAL LIGATION    . WRIST ARTHROSCOPY Left 1990   for degenerative changes    There were no vitals filed for this visit.  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  No/denies        [facility] OT Assessment - 01/24/18 0001      Assessment   Medical Diagnosis  R prox row carpectomy  and CTR    Referring Provider (OT)  Sharlett Iles    Onset Date/Surgical Date  12/06/17    Hand Dominance  Right    Next MD Visit  --   26 Nov     Precautions   Required Braces or Orthoses  --   Prefab wrist splint per pt wearing with act that cause pain      Home  Environment   Lives With  --   Brother     Prior Function   Vocation  On disability    Leisure  House work , restore furniture , play some games on phone      AROM   Right Wrist Extension  33 Degrees    Right Wrist Flexion  25 Degrees    Right Wrist Radial Deviation  15 Degrees    Right Wrist Ulnar Deviation  8 Degrees    Left Wrist Extension  70 Degrees    Left Wrist Flexion  70 Degrees    Left Wrist Radial Deviation  22 Degrees    Left Wrist Ulnar Deviation  25 Degrees      Right Hand AROM  R Thumb Opposition to Index  --   Opposition to 2nd fold of 5th       fluidotherapy done with AROM for digits and wrist in all planes prior to review of HEP to decrease pain and increase ROM      Heat   scar massage and use of cica scar pad for night time use with tubi grip   AROM for wrist flexion , ext, RD, UD - pain free -slight pull  10 reps  3 x day  Can add some gentle PROM to wrist ext, flexion ,UD and RD in 3 days if no increase pain  Same Slight pull - less than 1-2/10  2-3 x day prior to AROM   Built up handles of her tools - fatter , larger grips  Avoid vibration - like her sander  Wear brace with heavy or painfullact  Can wean into [place] neoprene with some of act              OT Education - 01/24/18 0858    Education Details  findings and HEP     Person(s) Educated  Patient    Methods  Explanation;Demonstration;Tactile cues;Handout    Comprehension  Verbalized understanding;Returned demonstration       OT Short Term Goals - 01/24/18 1638      OT SHORT TERM GOAL #1   Title  Pt to be ind in HEP to increase AROM to R wrist , decrease scar tissue and increase grip and prehension without  increase pain to increase functional use to 75%    Baseline  no knowledge of HEP - pain can increase still to 8/10 with use - and function now 54%    Time  2    Period  Weeks    Status  New    Target Date  02/07/18      OT SHORT TERM GOAL #2   Title  AROM for  R  wrist improve with 5-10 degrees to turn doorknob , wipe table , dress with more ease     Baseline  Wrist flexion 25, ext 33 degrees on R     Time  3    Period  Weeks    Status  New    Target Date  02/14/18      OT SHORT TERM GOAL #3   Title  Pain improve with use of R hand and wrist on PRWHE with more than 10 points     Baseline  pain on PRWHE at eval 19/50    Time  3    Period  Weeks    Status  New    Target Date  02/14/18        OT Long Term Goals - 01/24/18 1712      OT LONG TERM GOAL #1   Title  Strength in R wrist improve to 4+/5 with no increase symptoms or pain to cut food and use hand in 50% of furniture restoration    Baseline  no strength yet - wear brace with act and pain increase with use to 8/10    Time  6    Period  Weeks    Status  New    Target Date  03/07/18      OT LONG TERM GOAL #2   Title  R grip and prehension strength improve to more than 50% compare to L hand and wrist     Baseline  NT - pt 7 wks s/p and  in  brace still - and first session today -pain at the worse 8/10    Time  6    Period  Weeks    Status  New    Target Date  03/07/18      OT LONG TERM GOAL #3   Title  PRWHE score for function improve with more than 15 points     Baseline  at eval function score on PRWHE was 23/50    Time  6    Period  Weeks    Status  New    Target Date  03/07/18            Plan - 01/24/18 8416    Clinical Impression Statement  Pt present at OT eval 7 wks s/p R prox carpectomy with CTR - pt report she thought they will setup the therapy- that is why she come in now - pain much better than what is was prior to surgry - but sometimes over do it maybe little - not sleeping with wrist splint  but still wearing it during day but feels like it is bothering her skin more - pt show decrease ROM in wrist , thumb - with  decrease strength - limiting her functional use of R dominant hand in ADLs and ADL's - scar tissue at [place] still tight and thick - and report at times still some pins and needles - pt can benefit from OT services     Occupational performance deficits (Please refer to evaluation for details):  ADL's;IADL's;Play;Leisure    Rehab Potential  Good    OT Frequency  1x / week    OT Duration  6 weeks    OT Treatment/Interventions  Therapeutic exercise;Self-care/ADL training;Paraffin;Fluidtherapy;Splinting;Patient/family education;Scar mobilization;Manual Therapy;DME and/or AE instruction    Plan  assess progress with HEP     Clinical Decision Making  Several treatment options, min-mod task modification necessary    OT Home Exercise Plan  see pt instruction    Consulted and Agree with Plan of Care  Patient       Patient will benefit from skilled therapeutic intervention in order to improve the following deficits and impairments:  Impaired flexibility, Pain, Decreased knowledge of use of DME, Impaired sensation, Decreased strength, Decreased range of motion, Impaired UE functional use, Decreased knowledge of precautions  Visit Diagnosis: Stiffness of right wrist, not elsewhere classified - Plan: Ot plan of care cert/re-cert  Stiffness of right hand, not elsewhere classified - Plan: Ot plan of care cert/re-cert  Scar condition and fibrosis of skin - Plan: Ot plan of care cert/re-cert  Muscle weakness (generalized) - Plan: Ot plan of care cert/re-cert  Other disturbances of skin sensation - Plan: Ot plan of care cert/re-cert    Problem List Patient Active Problem List   Diagnosis Date Noted  . Encounter for screening colonoscopy   . Benign neoplasm of ascending colon   . Internal hemorrhoids   . Polyp of sigmoid colon   . Intestinal lump   . Encounter for monitoring  chronic NSAID therapy 07/13/2017  . Tobacco abuse 07/13/2017  . Bipolar 1 disorder ([place])   . Anxiety   . Arthritis     Rosalyn Gess OTR/L,CLT 01/24/2018, 5:35 PM  [place] [facility] [address] [place], [address] Phone: [phone]   Fax:  [phone]  Name: Alexa Newman MRN: [id number] Date of Birth: Sep 04, 1960

## 2018-01-31 ENCOUNTER — Other Ambulatory Visit: Payer: Self-pay | Admitting: Family Medicine

## 2018-01-31 ENCOUNTER — Ambulatory Visit: Payer: Medicaid Other | Admitting: Occupational Therapy

## 2018-01-31 DIAGNOSIS — L905 Scar conditions and fibrosis of skin: Secondary | ICD-10-CM | POA: Diagnosis not present

## 2018-01-31 DIAGNOSIS — M25641 Stiffness of right hand, not elsewhere classified: Secondary | ICD-10-CM | POA: Diagnosis not present

## 2018-01-31 DIAGNOSIS — R208 Other disturbances of skin sensation: Secondary | ICD-10-CM

## 2018-01-31 DIAGNOSIS — M25631 Stiffness of right wrist, not elsewhere classified: Secondary | ICD-10-CM | POA: Diagnosis not present

## 2018-01-31 DIAGNOSIS — M6281 Muscle weakness (generalized): Secondary | ICD-10-CM | POA: Diagnosis not present

## 2018-01-31 DIAGNOSIS — Z1231 Encounter for screening mammogram for malignant neoplasm of breast: Secondary | ICD-10-CM

## 2018-01-31 NOTE — Therapy (Signed)
[place] [facility] [address] [place], [address] Phone: [phone]   Fax:  [phone]  Occupational Therapy Treatment  Patient Details  Name: MALARIE TAPPEN MRN: [id number] Date of Birth: 02-24-1961 Referring Provider (OT): Carlynn Spry Date: 01/31/2018  OT End of Session - 01/31/18 1018    Visit Number  2    Number of Visits  6    Date for OT Re-Evaluation  03/07/18    OT Start Time  1001    OT Stop Time  1047    OT Time Calculation (min)  46 min    Activity Tolerance  Patient tolerated treatment well    Behavior During Therapy  [facility] for tasks assessed/performed       Past Medical History:  Diagnosis Date  . Anxiety   . Arthritis   . Bipolar 1 disorder ([place])   . Depression   . Osteoarthritis     Past Surgical History:  Procedure Laterality Date  . ANTERIOR CRUCIATE LIGAMENT (ACL) REVISION Right   . CARPAL TUNNEL RELEASE    . COLONOSCOPY    . COLONOSCOPY WITH PROPOFOL N/A 11/17/2017   Procedure: COLONOSCOPY WITH PROPOFOL;  Surgeon: Virgel Manifold, MD;  Location: [facility];  Service: Endoscopy;  Laterality: N/A;  . DILATION AND CURETTAGE OF UTERUS  2013   for heavy bleeding  . ENDOMETRIAL ABLATION  2013  . TUBAL LIGATION    . WRIST ARTHROSCOPY Left 1990   for degenerative changes    There were no vitals filed for this visit.  Subjective Assessment - 01/31/18 1001    Subjective   My wrist is doing okay - pain is better still tender over top of wrist at scar - not to comfortable in splint - I did hit my wrist this am - and it is stiff mostly in the am - using it more     Patient Stated Goals  Want to get my right  wrist better and stronger so I can use it in  house act, and my furniture restoration without pain     Currently in Pain?  Yes    Pain Score  2     Pain Location  Wrist    Pain Orientation  Right    Pain Descriptors / Indicators  Sore    Pain Type  Surgical pain     Pain Onset  More than a month ago    Pain Frequency  Constant         [facility] OT Assessment - 01/31/18 0001      AROM   Right Wrist Extension  45 Degrees    Right Wrist Flexion  30 Degrees    Right Wrist Radial Deviation  20 Degrees    Right Wrist Ulnar Deviation  8 Degrees      Strength   Right Hand Grip (lbs)  20    Right Hand Lateral Pinch  12 lbs    Right Hand 3 Point Pinch  7 lbs    Left Hand Grip (lbs)  46    Left Hand Lateral Pinch  16 lbs    Left Hand 3 Point Pinch  13 lbs       a assess progress with HEP since evaluation - pt education on what to expected ROM for this surgery   pt report understanding          OT Treatments/Exercises (OP) - 01/31/18 0001  RUE Fluidotherapy   Number Minutes Fluidotherapy  8 Minutes    RUE Fluidotherapy Location  Hand;Wrist    Comments  AROM for wrist in all planes and digits prior to soft tissue and ROM      Pt to cont with  heat, gentle PROM for wrist in all planes at home    Done some soft tissue mobs to CT spreads , CT massage , webspace - and dorsal scar   tolerating well - pt tender over dorsal scar    pt ed on desentitization  And scar massage to do at home  Review with  Pt gentle AAROM and PROM for wrist flexion, ext and RD  Followed by  1 lbs or 16oz hammer for HEP - for wrist in all planes 10 reps done pain free - done well   if  no pain  Can increase to 2 to 3 sets  Over the next week   And add putty light blue - this week 1 to 2 sets of 10-15 for grip -and if pain free with putty in 6 days  Can increase to teal putty - 1 set of 10-15  Set and then 2 sets  2 x day pain free Wear neoprene splint and hard splint as needed with act that cause pain          OT Education - 01/31/18 1018    Education Details  review gentle PROM, strenghtening for wrist and putty-     Person(s) Educated  Patient    Methods  Explanation;Demonstration;Tactile cues;Handout    Comprehension  Verbalized  understanding;Returned demonstration       OT Short Term Goals - 01/24/18 1638      OT SHORT TERM GOAL #1   Title  Pt to be ind in HEP to increase AROM to R wrist , decrease scar tissue and increase grip and prehension without increase pain to increase functional use to 75%    Baseline  no knowledge of HEP - pain can increase still to 8/10 with use - and function now 54%    Time  2    Period  Weeks    Status  New    Target Date  02/07/18      OT SHORT TERM GOAL #2   Title  AROM for  R  wrist improve with 5-10 degrees to turn doorknob , wipe table , dress with more ease     Baseline  Wrist flexion 25, ext 33 degrees on R     Time  3    Period  Weeks    Status  New    Target Date  02/14/18      OT SHORT TERM GOAL #3   Title  Pain improve with use of R hand and wrist on PRWHE with more than 10 points     Baseline  pain on PRWHE at eval 19/50    Time  3    Period  Weeks    Status  New    Target Date  02/14/18        OT Long Term Goals - 01/24/18 1712      OT LONG TERM GOAL #1   Title  Strength in R wrist improve to 4+/5 with no increase symptoms or pain to cut food and use hand in 50% of furniture restoration    Baseline  no strength yet - wear brace with act and pain increase with use to 8/10    Time  6    Period  Weeks    Status  New    Target Date  03/07/18      OT LONG TERM GOAL #2   Title  R grip and prehension strength improve to more than 50% compare to L hand and wrist     Baseline  NT - pt 7 wks s/p and  in brace still - and first session today -pain at the worse 8/10    Time  6    Period  Weeks    Status  New    Target Date  03/07/18      OT LONG TERM GOAL #3   Title  PRWHE score for function improve with more than 15 points     Baseline  at eval function score on PRWHE was 23/50    Time  6    Period  Weeks    Status  New    Target Date  03/07/18            Plan - 01/31/18 1018    Clinical Impression Statement  Pt is about 8 wks s/p R prox  carpectomy with CTR - pt report increase use , less pain - pt to cont to increase ROM and strength without increasing pain -use 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 - but not to do sanding because of vibration - pt to see surgeon tomorrow     Occupational performance deficits (Please refer to evaluation for details):  ADL's;IADL's;Play;Leisure    Rehab Potential  Good    OT Frequency  1x / week    OT Duration  4 weeks    OT Treatment/Interventions  Therapeutic exercise;Self-care/ADL training;Paraffin;Fluidtherapy;Splinting;Patient/family education;Scar mobilization;Manual Therapy;DME and/or AE instruction    Plan  assess progress and update HEP as needed     Clinical Decision Making  Several treatment options, min-mod task modification necessary    OT Home Exercise Plan  see pt instruction    Consulted and Agree with Plan of Care  Patient       Patient will benefit from skilled therapeutic intervention in order to improve the following deficits and impairments:  Impaired flexibility, Pain, Decreased knowledge of use of DME, Impaired sensation, Decreased strength, Decreased range of motion, Impaired UE functional use, Decreased knowledge of precautions  Visit Diagnosis: Stiffness of right hand, not elsewhere classified  Scar condition and fibrosis of skin  Muscle weakness (generalized)  Other disturbances of skin sensation    Problem List Patient Active Problem List   Diagnosis Date Noted  . Encounter for screening colonoscopy   . Benign neoplasm of ascending colon   . Internal hemorrhoids   . Polyp of sigmoid colon   . Intestinal lump   . Encounter for monitoring chronic NSAID therapy 07/13/2017  . Tobacco abuse 07/13/2017  . Bipolar 1 disorder ([place])   . Anxiety   . Arthritis     Rosalyn Gess OTR/L,CLT 01/31/2018, 4:53 PM  [place] [facility] [address], [place], [address] Phone:  [phone]   Fax:  [phone]  Name: ERIA LOZOYA MRN: [id number] Date of Birth: 1961/01/27

## 2018-01-31 NOTE — Patient Instructions (Signed)
Cont with heat, gentle PROM for wrist in all planes Scar massage Add this date 1 lbs or 16oz hammer for HEP - for wrist in all planes 10 reps  no pain  Can increase to 2 to 3 sets if pain free  Over the next week  And add putty light blue - this week 1 to 2 sets of 10 for grip -and if pain free with putty in 6 days  Can increase to teal putty - 1 set of 10-15  2 x day pain free Wear neoprene splint and hard splint as needed with act that cause pain

## 2018-02-08 ENCOUNTER — Ambulatory Visit: Payer: Medicaid Other | Admitting: Occupational Therapy

## 2018-02-09 ENCOUNTER — Ambulatory Visit
Admission: RE | Admit: 2018-02-09 | Discharge: 2018-02-09 | Disposition: A | Discharge status: Home / Self Care | Payer: Medicaid Other | Source: Ambulatory Visit | Attending: Family Medicine | Admitting: Family Medicine

## 2018-02-09 ENCOUNTER — Ambulatory Visit: Payer: Medicaid Other | Attending: Orthopedic Surgery | Admitting: Occupational Therapy

## 2018-02-09 DIAGNOSIS — Z1231 Encounter for screening mammogram for malignant neoplasm of breast: Secondary | ICD-10-CM | POA: Diagnosis not present

## 2018-02-09 DIAGNOSIS — L905 Scar conditions and fibrosis of skin: Secondary | ICD-10-CM

## 2018-02-09 DIAGNOSIS — R208 Other disturbances of skin sensation: Secondary | ICD-10-CM | POA: Insufficient documentation

## 2018-02-09 DIAGNOSIS — M25641 Stiffness of right hand, not elsewhere classified: Secondary | ICD-10-CM

## 2018-02-09 DIAGNOSIS — M6281 Muscle weakness (generalized): Secondary | ICD-10-CM | POA: Diagnosis not present

## 2018-02-09 DIAGNOSIS — M25631 Stiffness of right wrist, not elsewhere classified: Secondary | ICD-10-CM | POA: Insufficient documentation

## 2018-02-09 NOTE — Patient Instructions (Signed)
Add AAROM over edge of table for wrist flexion, ext,RD, UD - pain free 10 reps  and cont 1 lbs weight but 3 sets of 10  And putty teal grip 3 sets of 10  Can add 1/2 of green to teal over weekend do 1 set , then 2 and 3sets again over the next week

## 2018-02-09 NOTE — Therapy (Signed)
[place] [facility] [address], [place], [address] Phone: [phone]   Fax:  [phone]  Occupational Therapy Treatment  Patient Details  Name: Alexa Newman MRN: [id number] Date of Birth: 11/11/1960 Referring Provider (OT): Carlynn Spry Date: 02/09/2018  OT End of Session - 02/09/18 1500    Visit Number  3    Number of Visits  6    Date for OT Re-Evaluation  03/07/18    OT Start Time  1255    OT Stop Time  1331    OT Time Calculation (min)  36 min    Activity Tolerance  Patient tolerated treatment well    Behavior During Therapy  [facility] for tasks assessed/performed       Past Medical History:  Diagnosis Date  . Anxiety   . Arthritis   . Bipolar 1 disorder ([place])   . Depression   . Osteoarthritis     Past Surgical History:  Procedure Laterality Date  . ANTERIOR CRUCIATE LIGAMENT (ACL) REVISION Right   . CARPAL TUNNEL RELEASE    . COLONOSCOPY    . COLONOSCOPY WITH PROPOFOL N/A 11/17/2017   Procedure: COLONOSCOPY WITH PROPOFOL;  Surgeon: Virgel Manifold, MD;  Location: [facility] END[facility]COPY;  Service: Endoscopy;  Laterality: N/A;  . DILATION AND CURETTAGE OF UTERUS  2013   for heavy bleeding  . ENDOMETRIAL ABLATION  2013  . TUBAL LIGATION    . WRIST ARTHROSCOPY Left 1990   for degenerative changes    There were no vitals filed for this visit.  Subjective Assessment - 02/09/18 1457    Subjective   I woke up this am with my wrist hurting - did see the surgeon last week - was happy with my progress- next follow up in January - I am one that push myself and do to much     Patient Stated Goals  Want to get my right  wrist better and stronger so I can use it in  house act, and my furniture restoration without pain     Currently in Pain?  Yes    Pain Score  4     Pain Location  Wrist    Pain Orientation  Right    Pain Descriptors / Indicators  Sore    Pain Type  Surgical pain    Pain Onset   Yesterday    Pain Frequency  Constant         [facility] OT Assessment - 02/09/18 0001      AROM   Right Wrist Extension  45 Degrees    Right Wrist Flexion  35 Degrees    Right Wrist Radial Deviation  18 Degrees    Right Wrist Ulnar Deviation  10 Degrees      Strength   Right Hand Grip (lbs)  25    Right Hand Lateral Pinch  12 lbs    Right Hand 3 Point Pinch  11 lbs       assess progress in ROM at R wrist and grip prehension  Pt had increase pain since this am  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 - pt to use palms , open hand , avoid vibration and tight small grips - enlarge her grips  And assess if act cause pain lingering for 2 hrs or  act cause pain 12- 24 hrs later  need to modify that act         OT Treatments/Exercises (  OP) - 02/09/18 0001      RUE Paraffin   Number Minutes Paraffin  10 Minutes    RUE Paraffin Location  Hand   and wrist   Comments  decrease pain at [facility] and prior to ROM       soft tissue mobs done to R hand - carpal spreads ,and webspace  Scar massage - and cont with cica scar pad for night time   Add and review AAROM over edge of table for wrist flexion ,ext, RD, UD  10 reps  pain free and do not push to hard  Attempted 2 lbs weight but pain with sup /pro on dorsal wrist  Done 1 lbs but pt can do 3 sets of 10   Putty teal - to cont with but do 3 sets for 3 days  And then add med green ( 1/2 of it ) to teal - and do 1 set of 10-15 3 days , then increase to 2 sets again          OT Education - 02/09/18 1500    Education Details  progress and HEP upgrade     Person(s) Educated  Patient    Methods  Explanation;Demonstration;Tactile cues;Handout    Comprehension  Verbalized understanding;Returned demonstration       OT Short Term Goals - 01/24/18 1638      OT SHORT TERM GOAL #1   Title  Pt to be ind in HEP to increase AROM to R wrist , decrease scar tissue and increase grip and prehension without increase pain  to increase functional use to 75%    Baseline  no knowledge of HEP - pain can increase still to 8/10 with use - and function now 54%    Time  2    Period  Weeks    Status  New    Target Date  02/07/18      OT SHORT TERM GOAL #2   Title  AROM for  R  wrist improve with 5-10 degrees to turn doorknob , wipe table , dress with more ease     Baseline  Wrist flexion 25, ext 33 degrees on R     Time  3    Period  Weeks    Status  New    Target Date  02/14/18      OT SHORT TERM GOAL #3   Title  Pain improve with use of R hand and wrist on PRWHE with more than 10 points     Baseline  pain on PRWHE at eval 19/50    Time  3    Period  Weeks    Status  New    Target Date  02/14/18        OT Long Term Goals - 01/24/18 1712      OT LONG TERM GOAL #1   Title  Strength in R wrist improve to 4+/5 with no increase symptoms or pain to cut food and use hand in 50% of furniture restoration    Baseline  no strength yet - wear brace with act and pain increase with use to 8/10    Time  6    Period  Weeks    Status  New    Target Date  03/07/18      OT LONG TERM GOAL #2   Title  R grip and prehension strength improve to more than 50% compare to L hand and wrist     Baseline  NT -  pt 7 wks s/p and  in brace still - and first session today -pain at the worse 8/10    Time  6    Period  Weeks    Status  New    Target Date  03/07/18      OT LONG TERM GOAL #3   Title  PRWHE score for function improve with more than 15 points     Baseline  at eval function score on PRWHE was 23/50    Time  6    Period  Weeks    Status  New    Target Date  03/07/18            Plan - 02/09/18 1501    Clinical Impression Statement  Pt is 9 wks s/p RE prox carpectomy with CTR - pt show increase AROM , functional strength and use - and increase grip and 3 point grip - did had pain this date more than usual since this am - 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 - not to  over do and have pain with act and HEP - increase ROM and strenght pain free     Occupational performance deficits (Please refer to evaluation for details):  ADL's;IADL's;Play;Leisure    Rehab Potential  Good    OT Frequency  1x / week    OT Duration  --   3 wks   OT Treatment/Interventions  Therapeutic exercise;Self-care/ADL training;Paraffin;Fluidtherapy;Splinting;Patient/family education;Scar mobilization;Manual Therapy;DME and/or AE instruction    Plan  assess  if can increase to 2 lbs s and update HEP as needed     Clinical Decision Making  Several treatment options, min-mod task modification necessary    OT Home Exercise Plan  see pt instruction    Consulted and Agree with Plan of Care  Patient       Patient will benefit from skilled therapeutic intervention in order to improve the following deficits and impairments:  Impaired flexibility, Pain, Decreased knowledge of use of DME, Impaired sensation, Decreased strength, Decreased range of motion, Impaired UE functional use, Decreased knowledge of precautions  Visit Diagnosis: Stiffness of right hand, not elsewhere classified  Scar condition and fibrosis of skin  Muscle weakness (generalized)  Other disturbances of skin sensation  Stiffness of right wrist, not elsewhere classified    Problem List Patient Active Problem List   Diagnosis Date Noted  . Encounter for screening colonoscopy   . Benign neoplasm of ascending colon   . Internal hemorrhoids   . Polyp of sigmoid colon   . Intestinal lump   . Encounter for monitoring chronic NSAID therapy 07/13/2017  . Tobacco abuse 07/13/2017  . Bipolar 1 disorder ([place])   . Anxiety   . Arthritis     Rosalyn Gess OTR/L,CLT 02/09/2018, 3:04 PM  [place] [facility] [address], [place], [address] Phone: [phone]   Fax:  [phone]  Name: Alexa Newman MRN: [id number] Date of Birth: Dec 11, 1960

## 2018-02-14 ENCOUNTER — Telehealth: Payer: Self-pay

## 2018-02-14 NOTE — Telephone Encounter (Signed)
-----   Message from Virginia Crews, MD sent at 02/14/2018 10:28 AM EST ----- Normal mammogram. Repeat in 1 yr

## 2018-02-14 NOTE — Telephone Encounter (Signed)
Patient advised as directed below.  Thanks,  -Alexa Newman 

## 2018-02-18 ENCOUNTER — Ambulatory Visit: Payer: Medicaid Other | Admitting: Occupational Therapy

## 2018-02-18 DIAGNOSIS — M25641 Stiffness of right hand, not elsewhere classified: Secondary | ICD-10-CM

## 2018-02-18 DIAGNOSIS — M25631 Stiffness of right wrist, not elsewhere classified: Secondary | ICD-10-CM | POA: Diagnosis not present

## 2018-02-18 DIAGNOSIS — L905 Scar conditions and fibrosis of skin: Secondary | ICD-10-CM | POA: Diagnosis not present

## 2018-02-18 DIAGNOSIS — R208 Other disturbances of skin sensation: Secondary | ICD-10-CM | POA: Diagnosis not present

## 2018-02-18 DIAGNOSIS — M6281 Muscle weakness (generalized): Secondary | ICD-10-CM | POA: Diagnosis not present

## 2018-02-18 NOTE — Therapy (Signed)
[facility] [address] [place], [address] Phone: [phone]   Fax:  [phone]  Occupational Therapy Treatment  Patient Details  Name: Alexa Newman MRN: [id number] Date of Birth: [birth year] Referring Provider (OT): Carlynn Spry Date: 02/18/2018  OT End of Session - 02/18/18 1315    Visit Number  4    Number of Visits  5    Date for OT Re-Evaluation  03/07/18    OT Start Time  1222    OT Stop Time  1258    OT Time Calculation (min)  36 min    Activity Tolerance  Patient tolerated treatment well    Behavior During Therapy  [facility] for tasks assessed/performed       Past Medical History:  Diagnosis Date  . Anxiety   . Arthritis   . Bipolar 1 disorder ([place])   . Depression   . Osteoarthritis     Past Surgical History:  Procedure Laterality Date  . ANTERIOR CRUCIATE LIGAMENT (ACL) REVISION Right   . CARPAL TUNNEL RELEASE    . COLONOSCOPY    . COLONOSCOPY WITH PROPOFOL N/A 11/17/2017   Procedure: COLONOSCOPY WITH PROPOFOL;  Surgeon: Virgel Manifold, MD;  Location: [facility] ENDOSCOPY;  Service: Endoscopy;  Laterality: N/A;  . DILATION AND CURETTAGE OF UTERUS  2013   for heavy bleeding  . ENDOMETRIAL ABLATION  2013  . TUBAL LIGATION    . WRIST ARTHROSCOPY Left 1990   for degenerative changes    There were no vitals filed for this visit.  Subjective Assessment - 02/18/18 1310    Subjective   Doing okay -did not had pain this past week -  I am very happy with my wrist - even if I don't gain back any more motion - using it more    Patient Stated Goals  Want to get my right  wrist better and stronger so I can use it in  house act, and my furniture restoration without pain     Currently in Pain?  No/denies         [facility] OT Assessment - 02/18/18 0001      AROM   Right Wrist Extension  48 Degrees    Right Wrist Flexion  38 Degrees    Right Wrist Radial Deviation  20 Degrees    Right  Wrist Ulnar Deviation  14 Degrees      Strength   Right Hand Grip (lbs)  25    Right Hand Lateral Pinch  12 lbs    Right Hand 3 Point Pinch  11 lbs    Left Hand Grip (lbs)  46    Left Hand Lateral Pinch  16 lbs    Left Hand 3 Point Pinch  13 lbs       Pt made great progress this past week - see flowsheet  cotn ot be less than 50% for wrist flexion  To focus on wrist flexion -and cont with other 3 ROM  M/M for wrist in all planes - 4/5  Had twitch of pain with RD, UD          OT Treatments/Exercises (OP) - 02/18/18 0001      RUE Paraffin   Number Minutes Paraffin  10 Minutes    RUE Paraffin Location  Hand   wrist   Comments  done wrist flexion stretch 3 x 30 sec       scar massage check and soft  tissue mobs to CT and MC spreads and webspace  As well as forearm sweeping volar with graston tool nr 2     PROM/stretches but focus still on wrist flexion  - showed and review in clinic And then 2 lbs weight for wrist in all planes - 10 reps - 3 sets  2 x day Putty green with blue - 10 reps 3 sets   2 x day  Add pulling and twisting with whole hand - 10 reps- no pain and review in clinic 2 sets - can increase to 3 sets in few days         OT Education - 02/18/18 1313    Education Details  progress- expectations after surgery for ROM -and review and changes to HEP    Person(s) Educated  Patient    Methods  Explanation;Demonstration;Tactile cues;Handout    Comprehension  Verbalized understanding;Returned demonstration       OT Short Term Goals - 02/18/18 1322      OT SHORT TERM GOAL #1   Title  Pt to be ind in HEP to increase AROM to R wrist , decrease scar tissue and increase grip and prehension without increase pain to increase functional use to 75%    Baseline  no increase pain this past week- progressing in ROM , scar tissue - and grip to 50% except wrist flexion     Time  3    Period  Weeks    Status  On-going    Target Date  03/07/18      OT SHORT TERM  GOAL #2   Title  AROM for  R  wrist improve with 5-10 degrees to turn doorknob , wipe table , dress with more ease     Baseline  Wrist flexion 25, ext 33 degrees on R  - and now 38 and 48 degrees     Status  Achieved      OT SHORT TERM GOAL #3   Title  Pain improve with use of R hand and wrist on PRWHE with more than 10 points     Baseline  pain on PRWHE at eval 19/50 and this date no pain this past week    Status  Achieved        OT Long Term Goals - 02/18/18 1323      OT LONG TERM GOAL #1   Title  Strength in R wrist improve to 4+/5 with no increase symptoms or pain to cut food and use hand in 50% of furniture restoration    Baseline  progress to 4/5 - RD, UD still twitch of pain with resistance - using hand more than 50%     Time  3    Period  Weeks    Status  On-going    Target Date  03/07/18      OT LONG TERM GOAL #2   Title  R grip and prehension strength improve to more than 50% compare to L hand and wrist     Baseline  Grip 25lbs on R , L 46 lbs    Status  Achieved            Plan - 02/18/18 1316    Clinical Impression Statement  Pt  is 10 wks s/p R proximal carpectomy with CTR - pt cont to show increase AROM - pt less than 50% in flexion - to cont to focus on wrist flexion -and strengthening of wrist - 3 sets of 2 lbs -  grip is about 50% - pt to cont with grip strength - and add  2 new putty exercises - and cont to increase motion and strength without increase pain     Occupational performance deficits (Please refer to evaluation for details):  ADL's;IADL's;Play;Leisure    Rehab Potential  Good    OT Frequency  --   1 more visit    OT Duration  --   3 wks   OT Treatment/Interventions  Therapeutic exercise;Self-care/ADL training;Paraffin;Fluidtherapy;Splinting;Patient/family education;Scar mobilization;Manual Therapy;DME and/or AE instruction    Plan  progress from HEP without increasing pain  - and possible discharge  2 lbs     Clinical Decision Making  Several  treatment options, min-mod task modification necessary    OT Home Exercise Plan  see pt instruction    Consulted and Agree with Plan of Care  Patient       Patient will benefit from skilled therapeutic intervention in order to improve the following deficits and impairments:  Impaired flexibility, Pain, Decreased knowledge of use of DME, Impaired sensation, Decreased strength, Decreased range of motion, Impaired UE functional use, Decreased knowledge of precautions  Visit Diagnosis: Stiffness of right hand, not elsewhere classified  Scar condition and fibrosis of skin  Muscle weakness (generalized)  Other disturbances of skin sensation  Stiffness of right wrist, not elsewhere classified    Problem List Patient Active Problem List   Diagnosis Date Noted  . Encounter for screening colonoscopy   . Benign neoplasm of ascending colon   . Internal hemorrhoids   . Polyp of sigmoid colon   . Intestinal lump   . Encounter for monitoring chronic NSAID therapy 07/13/2017  . Tobacco abuse 07/13/2017  . Bipolar 1 disorder ([place])   . Anxiety   . Arthritis     Rosalyn Gess OTR/L,CLT 02/18/2018, 1:27 PM  [facility] [address] [place], [address] Phone: [phone]   Fax:  [phone]  Name: Alexa Newman MRN: [id number] Date of Birth: 1960-07-24

## 2018-02-18 NOTE — Patient Instructions (Signed)
Pt to cont with PROM/stretches but focus still on wrist flexion  And then 2 lbs weight for wrist in all planes - 10 reps - 3 sets  2 x day Putty green with blue - 10 reps 3 sets  And 2 x day  Add pulling and twisting with whole hand - 10 reps 2 sets - can increase to 3 sets in few days

## 2018-03-07 ENCOUNTER — Ambulatory Visit: Payer: Medicaid Other | Admitting: Occupational Therapy

## 2018-03-21 ENCOUNTER — Other Ambulatory Visit: Payer: Self-pay

## 2018-03-21 ENCOUNTER — Ambulatory Visit (INDEPENDENT_AMBULATORY_CARE_PROVIDER_SITE_OTHER): Payer: Medicaid Other | Admitting: Psychiatry

## 2018-03-21 ENCOUNTER — Encounter: Payer: Self-pay | Admitting: Psychiatry

## 2018-03-21 VITALS — BP 159/93 | HR 97 | Temp 98.9°F | Wt 178.8 lb

## 2018-03-21 DIAGNOSIS — Z8659 Personal history of other mental and behavioral disorders: Secondary | ICD-10-CM | POA: Diagnosis not present

## 2018-03-21 DIAGNOSIS — F3162 Bipolar disorder, current episode mixed, moderate: Secondary | ICD-10-CM

## 2018-03-21 DIAGNOSIS — F5105 Insomnia due to other mental disorder: Secondary | ICD-10-CM

## 2018-03-21 DIAGNOSIS — F17200 Nicotine dependence, unspecified, uncomplicated: Secondary | ICD-10-CM | POA: Diagnosis not present

## 2018-03-21 DIAGNOSIS — F1421 Cocaine dependence, in remission: Secondary | ICD-10-CM

## 2018-03-21 DIAGNOSIS — R030 Elevated blood-pressure reading, without diagnosis of hypertension: Secondary | ICD-10-CM
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
## 2018-04-05 DIAGNOSIS — M19031 Primary osteoarthritis, right wrist: Secondary | ICD-10-CM | POA: Diagnosis not present

## 2018-04-05 DIAGNOSIS — M19039 Primary osteoarthritis, unspecified wrist: Secondary | ICD-10-CM | POA: Diagnosis not present

## 2018-04-05 DIAGNOSIS — M7989 Other specified soft tissue disorders: Secondary | ICD-10-CM | POA: Diagnosis not present

## 2018-04-15 ENCOUNTER — Ambulatory Visit: Payer: Self-pay | Admitting: Family Medicine

## 2018-04-18 ENCOUNTER — Ambulatory Visit (INDEPENDENT_AMBULATORY_CARE_PROVIDER_SITE_OTHER): Payer: Medicaid Other | Admitting: Family Medicine

## 2018-04-18 ENCOUNTER — Encounter: Payer: Self-pay | Admitting: Family Medicine

## 2018-04-18 VITALS — BP 128/85 | HR 92 | Temp 98.2°F | Wt 176.6 lb

## 2018-04-18 DIAGNOSIS — R040 Epistaxis: Secondary | ICD-10-CM

## 2018-04-18 DIAGNOSIS — I10 Essential (primary) hypertension: Secondary | ICD-10-CM | POA: Diagnosis not present

## 2018-04-18 DIAGNOSIS — M1990 Unspecified osteoarthritis, unspecified site: Secondary | ICD-10-CM | POA: Diagnosis not present

## 2018-04-18 DIAGNOSIS — E669 Obesity, unspecified: Secondary | ICD-10-CM | POA: Insufficient documentation

## 2018-04-18 DIAGNOSIS — Z6830 Body mass index (BMI) 30.0-30.9, adult: Secondary | ICD-10-CM

## 2018-04-18 DIAGNOSIS — Z720 Tobacco use: Secondary | ICD-10-CM

## 2018-04-18 MED ORDER — MELOXICAM 15 MG PO TABS: 15.0000 mg | ORAL_TABLET | Freq: Every day | ORAL | 5 refills | Status: DC

## 2018-04-18 NOTE — Assessment & Plan Note (Signed)
Patient remains precontemplative

## 2018-04-18 NOTE — Patient Instructions (Signed)
Nosebleed, Adult  A nosebleed is when blood comes out of the nose. Nosebleeds are common. Usually, they are not a sign of a serious condition.  Nosebleeds can happen if a small blood vessel in your nose starts to bleed or if the lining of your nose (mucous membrane) cracks. They are commonly caused by:   Allergies.   Colds.   Picking your nose.   Blowing your nose too hard.   An injury from sticking an object into your nose or getting hit in the nose.   Dry or cold air.  Less common causes of nosebleeds include:   Toxic fumes.   Something abnormal in the nose or in the air-filled spaces in the bones of the face (sinuses).   Growths in the nose, such as polyps.   Medicines or conditions that cause blood to clot slowly.   Certain illnesses or procedures that irritate or dry out the nasal passages.  Follow these instructions at home:  When you have a nosebleed:     Sit down and tilt your head slightly forward.   Use a clean towel or tissue to pinch your nostrils under the bony part of your nose. After 10 minutes, let go of your nose and see if bleeding starts again. Do not release pressure before that time. If there is still bleeding, repeat the pinching and holding for 10 minutes until the bleeding stops.   Do not place tissues or gauze in the nose to stop bleeding.   Avoid lying down and avoid tilting your head backward. That may make blood collect in the throat and cause gagging or coughing.   Use a nasal spray decongestant to help with a nosebleed as told by your health care provider.   Do not use petroleum jelly or mineral oil in your nose. It can drip into your lungs.  After a nosebleed:   Avoid blowing your nose or sniffing for a number of hours.   Avoid straining, lifting, or bending at the waist for several days. You may resume other normal activities as you are able.   Use saline spray or a humidifier as told by your health care provider.   Aspirinand blood thinners make bleeding more  likely. If you are prescribed these medicines and you suffer from nosebleeds:  ? Ask your health care provider if you should stop taking the medicines or if you should adjust the dose.  ? Do not stop taking medicines that your health care provider has recommended unless told by your health care provider.   If your nosebleed was caused by dry mucous membranes, use over-the-counter saline nasal spray or gel. This will keep the mucous membranes moist and allow them to heal. If you must use a lubricant:  ? Choose one that is water-soluble.  ? Use only as much as you need and use it only as often as needed.  ? Do not lie down until several hours after you use it.  Contact a health care provider if:   You have a fever.   You get nosebleeds often or more often than usual.   You bruise very easily.   You have a nosebleed from having something stuck in your nose.   You have bleeding in your mouth.   You vomit or cough up brown material.   You have a nosebleed after you start a new medicine.  Get help right away if:   You have a nosebleed after a fall or a head injury.     Your nosebleed does not go away after 20 minutes.   You feel dizzy or weak.   You have unusual bleeding from other parts of your body.   You have unusual bruising on other parts of your body.   You become sweaty.   You vomit blood.  This information is not intended to replace advice given to you by your health care provider. Make sure you discuss any questions you have with your health care provider.  Document Released: 12/03/2004 Document Revised: 06/30/2016 Document Reviewed: 09/10/2015  Elsevier Interactive Patient Education  2019 [employer]

## 2018-04-18 NOTE — Assessment & Plan Note (Signed)
Chronic and stable Discussed risks of chronic NSAID use Continue meloxicam

## 2018-04-18 NOTE — Progress Notes (Signed)
Patient: Alexa Newman Female    DOB: [birth year]   58 y.o.   MRN: [id number] Visit Date: 04/18/2018  Today's Provider: [name], MD   Chief Complaint  Patient presents with  . Hypertension   Subjective:    I, Tiburcio Pea, CMA, am acting as a Education administrator for [name], MD.   HPI  Hypertension, follow-up:  BP Readings from Last 3 Encounters:  04/18/18 128/85  03/21/18 (!) 159/93  01/18/18 (!) 144/87    She was last seen for hypertension 3 months ago.  BP at that visit was 138/89. Management changes since that visit include continue medication. She reports good compliance with treatment. She is not having side effects.  She is not exercising. She is adherent to low salt diet.   Outside blood pressures are being checked at home. She is experiencing none.  Patient denies chest pain, chest pressure/discomfort, claudication, dyspnea, exertional chest pressure/discomfort, fatigue, irregular heart beat, lower extremity edema, near-syncope, orthopnea, palpitations, paroxysmal nocturnal dyspnea, syncope and tachypnea.   Cardiovascular risk factors include dyslipidemia, hypertension and smoking/ tobacco exposure.  Use of agents associated with hypertension: none.     Weight trend: stable Wt Readings from Last 3 Encounters:  04/18/18 176 lb 9.6 oz (80.1 kg)  03/21/18 178 lb 12.8 oz (81.1 kg)  01/18/18 179 lb 6.4 oz (81.4 kg)    ------------------------------------------------------------------------  3-4 episodes of nosebleeds in last week. Never had this before. Blows her nose frequently.  Now improved.  Resolved within 1 minute with pinching nose.  Arthritis of multiple joints.  Previously seeing Ortho.  No surgical interventions.  Well controlled with Meloxicam. Stiff in the mornings   Allergies  Allergen Reactions  . Pregabalin     Other reaction(s): Other (See Comments) Suicidal ideation     Current Outpatient Medications:  .  amLODipine  (NORVASC) 5 MG tablet, Take 1 tablet (5 mg total) by mouth daily., Disp: 90 tablet, Rfl: 3 .  doxepin (SINEQUAN) 10 MG capsule, Take 1-2 capsules (10-20 mg total) by mouth at bedtime as needed. For sleep, Disp: 60 capsule, Rfl: 2 .  lamoTRIgine (LAMICTAL) 150 MG tablet, Take 1 tablet (150 mg total) by mouth daily., Disp: 30 tablet, Rfl: 2 .  meloxicam (MOBIC) 15 MG tablet, Take 15 mg by mouth daily., Disp: , Rfl:   Review of Systems  Constitutional: Negative.   HENT: Positive for nosebleeds (5 times in 3 days last week).   Respiratory: Negative.   Cardiovascular: Negative.  Negative for chest pain.  Musculoskeletal: Negative.     Social History   Tobacco Use  . Smoking status: Current Every Day Smoker    Packs/day: 0.75    Years: 45.00    Pack years: 33.75    Types: Cigarettes  . Smokeless tobacco: Never Used  . Tobacco comment: started smoking at age 50  Substance Use Topics  . Alcohol use: Yes    Comment: 1-2 times per month      Objective:   BP 128/85 (BP Location: Right Arm, Patient Position: Sitting, Cuff Size: Normal)   Pulse 92   Temp 98.2 F (36.8 C) (Oral)   Wt 176 lb 9.6 oz (80.1 kg)   LMP 12/07/2011   SpO2 98%   BMI 30.31 kg/m  Vitals:   04/18/18 0844  BP: 128/85  Pulse: 92  Temp: 98.2 F (36.8 C)  TempSrc: Oral  SpO2: 98%  Weight: 176 lb 9.6 oz (80.1 kg)  Physical Exam Vitals signs reviewed.  Constitutional:      General: She is not in acute distress.    Appearance: Normal appearance. She is not diaphoretic.  HENT:     Head: Normocephalic and atraumatic.     Nose: Nose normal.     Mouth/Throat:     Pharynx: Oropharynx is clear.  Eyes:     General: No scleral icterus.    Conjunctiva/sclera: Conjunctivae normal.  Neck:     Musculoskeletal: Neck supple.  Cardiovascular:     Rate and Rhythm: Normal rate and regular rhythm.     Pulses: Normal pulses.     Heart sounds: Normal heart sounds. No murmur.  Pulmonary:     Effort: Pulmonary  effort is normal. No respiratory distress.     Breath sounds: Normal breath sounds. No wheezing or rhonchi.  Abdominal:     General: There is no distension.     Palpations: Abdomen is soft.     Tenderness: There is no abdominal tenderness.  Musculoskeletal:     Right lower leg: No edema.     Left lower leg: No edema.  Lymphadenopathy:     Cervical: No cervical adenopathy.  Skin:    General: Skin is warm and dry.     Capillary Refill: Capillary refill takes less than 2 seconds.     Findings: No rash.  Neurological:     Mental Status: She is alert and oriented to person, place, and time. Mental status is at baseline.  Psychiatric:        Mood and Affect: Mood normal.        Behavior: Behavior normal.         Assessment & Plan   Problem List Items Addressed This Visit      Cardiovascular and Mediastinum   Essential hypertension - Primary    Well controlled Continue amlodipine Check BMP F/u in 6 months      Relevant Orders   Basic Metabolic Panel (BMET)     Musculoskeletal and Integument   Arthritis    Chronic and stable Discussed risks of chronic NSAID use Continue meloxicam      Relevant Medications   meloxicam (MOBIC) 15 MG tablet     Other   Tobacco abuse    Patient remains precontemplative      Obesity    Discussed diet and exercise       Other Visit Diagnoses    Nosebleed        - new problem - likely due to dryness and irritation - discussed humidifier, moisturizer, and avoiding irritation   Return in about 6 months (around 10/17/2018) for CPE.   The entirety of the information documented in the History of Present Illness, Review of Systems and Physical Exam were personally obtained by me. Portions of this information were initially documented by Tiburcio Pea, CMA and reviewed by me for thoroughness and accuracy.    Virginia Crews, MD, MPH [facility] 04/18/2018 9:07 AM

## 2018-04-18 NOTE — Assessment & Plan Note (Signed)
Discussed diet and exercise 

## 2018-04-18 NOTE — Assessment & Plan Note (Signed)
Well controlled Continue amlodipine Check BMP F/u in 6 months

## 2018-04-19 ENCOUNTER — Telehealth: Payer: Self-pay

## 2018-04-19 LAB — BASIC METABOLIC PANEL
BUN/Creatinine Ratio: 19 (ref 9–23)
BUN: 14 mg/dL (ref 6–24)
CALCIUM: 9.4 mg/dL (ref 8.7–10.2)
CO2: 22 mmol/L (ref 20–29)
Chloride: 108 mmol/L — ABNORMAL HIGH (ref 96–106)
Creatinine, Ser: 0.74 mg/dL (ref 0.57–1.00)
GFR calc Af Amer: 104 mL/min/{1.73_m2} (ref >59)
GFR calc non Af Amer: 90 mL/min/{1.73_m2} (ref >59)
GLUCOSE: 109 mg/dL — AB (ref 65–99)
POTASSIUM: 4.2 mmol/L (ref 3.5–5.2)
SODIUM: 142 mmol/L (ref 134–144)

## 2018-04-19 NOTE — Telephone Encounter (Signed)
-----   Message from Virginia Crews, MD sent at 04/19/2018  8:57 AM EST ----- Normal labs, except blood sugar is high if fasting.

## 2018-04-19 NOTE — Telephone Encounter (Signed)
LMTCB

## 2018-05-12 ENCOUNTER — Other Ambulatory Visit: Payer: Self-pay | Admitting: Psychiatry

## 2018-05-12 DIAGNOSIS — F3162 Bipolar disorder, current episode mixed, moderate: Secondary | ICD-10-CM

## 2018-06-20 ENCOUNTER — Other Ambulatory Visit: Payer: Self-pay

## 2018-06-20 ENCOUNTER — Ambulatory Visit (INDEPENDENT_AMBULATORY_CARE_PROVIDER_SITE_OTHER): Payer: Medicaid Other | Admitting: Psychiatry

## 2018-06-20 ENCOUNTER — Encounter: Payer: Self-pay | Admitting: Psychiatry

## 2018-06-20 DIAGNOSIS — F3162 Bipolar disorder, current episode mixed, moderate: Secondary | ICD-10-CM

## 2018-06-20 DIAGNOSIS — F17200 Nicotine dependence, unspecified, uncomplicated: Secondary | ICD-10-CM | POA: Diagnosis not present

## 2018-06-20 DIAGNOSIS — Z8659 Personal history of other mental and behavioral disorders: Secondary | ICD-10-CM | POA: Diagnosis not present

## 2018-06-20 DIAGNOSIS — F5105 Insomnia due to other mental disorder: Secondary | ICD-10-CM | POA: Diagnosis not present

## 2018-06-20 DIAGNOSIS — F1421 Cocaine dependence, in remission: Secondary | ICD-10-CM
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
## 2018-06-20 NOTE — Progress Notes (Signed)
Tc on  06-20-18 @ 9:24 pt medical and surgical hx was reviewed with no updates. Pt allergies were reviewed with no changes. Pt medications and pharmacy were reviewed with no updates. No pt vitals taken because this is a phone consult.

## 2018-08-16 ENCOUNTER — Other Ambulatory Visit: Payer: Self-pay | Admitting: Psychiatry

## 2018-08-16 DIAGNOSIS — F3162 Bipolar disorder, current episode mixed, moderate: Secondary | ICD-10-CM

## 2018-08-19 ENCOUNTER — Ambulatory Visit: Payer: Medicaid Other | Admitting: Psychiatry

## 2018-08-19 ENCOUNTER — Encounter: Payer: Self-pay | Admitting: Psychiatry

## 2018-08-19 ENCOUNTER — Other Ambulatory Visit: Payer: Self-pay

## 2018-08-19 ENCOUNTER — Ambulatory Visit (INDEPENDENT_AMBULATORY_CARE_PROVIDER_SITE_OTHER): Payer: Medicaid Other | Admitting: Psychiatry

## 2018-08-19 DIAGNOSIS — F410 Panic disorder [episodic paroxysmal anxiety] without agoraphobia: Secondary | ICD-10-CM | POA: Diagnosis not present

## 2018-08-19 DIAGNOSIS — F17200 Nicotine dependence, unspecified, uncomplicated: Secondary | ICD-10-CM | POA: Diagnosis not present

## 2018-08-19 DIAGNOSIS — F1421 Cocaine dependence, in remission: Secondary | ICD-10-CM

## 2018-08-19 DIAGNOSIS — F3162 Bipolar disorder, current episode mixed, moderate: Secondary | ICD-10-CM

## 2018-08-19 DIAGNOSIS — F5105 Insomnia due to other mental disorder: Secondary | ICD-10-CM | POA: Diagnosis not present

## 2018-08-19 DIAGNOSIS — Z8659 Personal history of other mental and behavioral disorders: Secondary | ICD-10-CM

## 2018-08-19 MED ORDER — HYDROXYZINE HCL 25 MG PO TABS: 25.0000 mg | ORAL_TABLET | Freq: Three times a day (TID) | ORAL | 1 refills | Status: DC | PRN
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
## 2018-09-12 ENCOUNTER — Other Ambulatory Visit: Payer: Self-pay

## 2018-09-12 ENCOUNTER — Ambulatory Visit: Payer: Medicaid Other | Admitting: Licensed Clinical Social Worker

## 2018-09-12 DIAGNOSIS — F1421 Cocaine dependence, in remission: Secondary | ICD-10-CM

## 2018-09-12 DIAGNOSIS — F3162 Bipolar disorder, current episode mixed, moderate: Secondary | ICD-10-CM

## 2018-09-12 DIAGNOSIS — Z8659 Personal history of other mental and behavioral disorders: Secondary | ICD-10-CM

## 2018-09-12 NOTE — Progress Notes (Signed)
[facility] Initial Clinical Assessment  MRN: [id number] NAME: ALYSIANA ETHRIDGE Date: 09/12/18  Total time:   1 hour Call number:  [phone]  Type of Contact:   telephone Patient consent obtained:  yes Reason for Visit today:  establish care  Treatment History Patient recently received Inpatient Treatment:  denies  Facility/Program:    Date of discharge:   Patient currently being seen by therapist/psychiatrist:  "Years ago" Patient currently receiving the following services:    Past Psychiatric History/Hospitalization(s): Anxiety: Yes Bipolar Disorder: Yes Depression: No Mania: No Psychosis: No Schizophrenia: No Personality Disorder: No Hospitalization for psychiatric illness: No History of Electroconvulsive Shock Therapy: No Prior Suicide Attempts: No  Clinical Assessment:  PHQ-9 Assessments: Depression screen PHQ 2/9 07/13/2017  Decreased Interest 1  Down, Depressed, Hopeless 1  PHQ - 2 Score 2  Altered sleeping 3  Tired, decreased energy 1  Change in appetite 1  Feeling bad or failure about yourself  1  Trouble concentrating 3  Moving slowly or fidgety/restless 0  Suicidal thoughts 0  PHQ-9 Score 11  Difficult doing work/chores Not difficult at all    GAD-7 Assessments: GAD 7 : Generalized Anxiety Score 07/13/2017  Nervous, Anxious, on [place] 2  Control/stop worrying 2  Worry too much - different things 2  Trouble relaxing 2  Restless 1  Easily annoyed or irritable 2  Afraid - awful might happen 1  Total GAD 7 Score 12  Anxiety Difficulty Somewhat difficult     Social Functioning Social maturity:  stated age Social judgement:  below stated age  Stress Current stressors:  CoVid 19 Familial stressors:  lives with her brother Sleep:  adequate Appetite:  adequate Coping ability:  ability to cope with daily stressors and symptomology Patient taking medications as prescribed:    Current medications:  Outpatient Encounter Medications as  of 09/12/2018  Medication Sig  . amLODipine (NORVASC) 5 MG tablet Take 1 tablet (5 mg total) by mouth daily.  Marland Kitchen doxepin (SINEQUAN) 10 MG capsule Take 1-2 capsules (10-20 mg total) by mouth at bedtime as needed. For sleep  . hydrOXYzine (ATARAX/VISTARIL) 25 MG tablet Take 1 tablet (25 mg total) by mouth 3 (three) times daily as needed. For anxiety attacks  . lamoTRIgine (LAMICTAL) 150 MG tablet TAKE 1 TABLET BY MOUTH EVERY DAY  . meloxicam (MOBIC) 15 MG tablet Take 1 tablet (15 mg total) by mouth daily.   No facility-administered encounter medications on file as of 09/12/2018.     Self-harm Behaviors Risk Assessment Self-harm risk factors:   Patient endorses recent thoughts of harming self:    [place] Suicide Severity Rating Scale: No flowsheet data found.  Danger to Others Risk Assessment Danger to others risk factors:   Patient endorses recent thoughts of harming others:    Dynamic Appraisal of Situational Aggression (DASA): No flowsheet data found.  Substance Use Assessment Patient recently consumed alcohol:    Alcohol Use Disorder Identification Test (AUDIT):  Alcohol Use Disorder Test (AUDIT) 10/13/2017  1. How often do you have a drink containing alcohol? 1  2. How many drinks containing alcohol do you have on a typical day when you are drinking? 0  3. How often do you have six or more drinks on one occasion? 0  AUDIT-C Score 1   Patient recently used drugs:    Opioid Risk Assessment:  Patient is concerned about dependence or abuse of substances:    ASAM Multidimensional Assessment Summary:  Dimension 1:  Dimension 1 Rating:    Dimension 2:    Dimension 2 Rating:    Dimension 3:    Dimension 3 Rating:    Dimension 4:    Dimension 4 Rating:    Dimension 5:    Dimension 5 Rating:    Dimension 6:    Dimension 6 Rating:   ASAM's Severity Rating Score:   ASAM Recommended Level of Treatment:     Goals, Interventions and Follow-up Plan Goals: Increase healthy adjustment  to current life circumstances Interventions: Brief CBT Follow-up Plan: Refer to [facility] Outpatient Therapy  Summary of Clinical Assessment Summary: Maddalyn is a 58 year old Caucasian female, divorced, unemployed, on SSI, lives in [place], has a history of bipolar disorder, borderline personality disorder, cocaine use disorder in remission, tobacco use disorder, presented to the clinic today to discuss her thoughts about CoVid 19.  Yuleni states that she lives with her brother and is afraid to leave the residence.  Takari reports that on most occassions she would rather stay home but her feelings have worsened since CoVid.  Denies working or being able to "hold down" a job.  Reports that she does not want ongoing treatment but assistance with how to "figure things out.  She reports that she has been diagnosed for several years and that her mood is currently stable.  She reports having self awareness and ability to determine when "things are not right."  Therapist reviewed with [place] coping strategies such as: deep breathing, positive self talk and support to assist her with going into the community.  Reports that she wants to attend her daughter's wedding in October 2020.   Lubertha South, LCSW

## 2018-10-07 ENCOUNTER — Ambulatory Visit (INDEPENDENT_AMBULATORY_CARE_PROVIDER_SITE_OTHER): Payer: Medicaid Other | Admitting: Psychiatry

## 2018-10-07 ENCOUNTER — Encounter: Payer: Self-pay | Admitting: Psychiatry

## 2018-10-07 ENCOUNTER — Other Ambulatory Visit: Payer: Self-pay

## 2018-10-07 DIAGNOSIS — F5105 Insomnia due to other mental disorder: Secondary | ICD-10-CM

## 2018-10-07 DIAGNOSIS — F410 Panic disorder [episodic paroxysmal anxiety] without agoraphobia: Secondary | ICD-10-CM | POA: Insufficient documentation

## 2018-10-07 DIAGNOSIS — F1421 Cocaine dependence, in remission: Secondary | ICD-10-CM | POA: Insufficient documentation

## 2018-10-07 DIAGNOSIS — F3162 Bipolar disorder, current episode mixed, moderate: Secondary | ICD-10-CM | POA: Diagnosis not present

## 2018-10-07 DIAGNOSIS — Z8659 Personal history of other mental and behavioral disorders: Secondary | ICD-10-CM | POA: Diagnosis not present

## 2018-10-07 DIAGNOSIS — F17200 Nicotine dependence, unspecified, uncomplicated: Secondary | ICD-10-CM
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
## 2018-10-17 ENCOUNTER — Encounter: Payer: Self-pay | Admitting: Family Medicine

## 2018-10-17 ENCOUNTER — Ambulatory Visit (INDEPENDENT_AMBULATORY_CARE_PROVIDER_SITE_OTHER): Payer: Medicaid Other | Admitting: Family Medicine

## 2018-10-17 ENCOUNTER — Other Ambulatory Visit: Payer: Self-pay

## 2018-10-17 VITALS — BP 154/94 | HR 87 | Temp 98.4°F | Wt 169.4 lb

## 2018-10-17 DIAGNOSIS — R739 Hyperglycemia, unspecified: Secondary | ICD-10-CM | POA: Diagnosis not present

## 2018-10-17 DIAGNOSIS — E663 Overweight: Secondary | ICD-10-CM

## 2018-10-17 DIAGNOSIS — M25561 Pain in right knee: Secondary | ICD-10-CM

## 2018-10-17 DIAGNOSIS — F17200 Nicotine dependence, unspecified, uncomplicated: Secondary | ICD-10-CM | POA: Diagnosis not present

## 2018-10-17 DIAGNOSIS — I10 Essential (primary) hypertension: Secondary | ICD-10-CM

## 2018-10-17 DIAGNOSIS — F319 Bipolar disorder, unspecified: Secondary | ICD-10-CM

## 2018-10-17 DIAGNOSIS — Z791 Long term (current) use of non-steroidal anti-inflammatories (NSAID): Secondary | ICD-10-CM | POA: Diagnosis not present

## 2018-10-17 DIAGNOSIS — Z5181 Encounter for therapeutic drug level monitoring: Secondary | ICD-10-CM | POA: Diagnosis not present

## 2018-10-17 DIAGNOSIS — Z0000 Encounter for general adult medical examination without abnormal findings: Secondary | ICD-10-CM | POA: Diagnosis not present

## 2018-10-17 DIAGNOSIS — G8929 Other chronic pain: Secondary | ICD-10-CM

## 2018-10-17 DIAGNOSIS — M25469 Effusion, unspecified knee: Secondary | ICD-10-CM

## 2018-10-17 NOTE — Patient Instructions (Addendum)

## 2018-10-17 NOTE — Assessment & Plan Note (Signed)
Followed by Psych [facility]

## 2018-10-17 NOTE — Assessment & Plan Note (Signed)
Discussed importance of cessation and risks of continuing to smoke Patient does not want to quit smoking

## 2018-10-17 NOTE — Assessment & Plan Note (Addendum)
Uncontrolled Patient believes this is related to excessive caffeine intake this AM No change to meds today Will f/u in 1 month and if still elevated, will need to make some changes

## 2018-10-17 NOTE — Assessment & Plan Note (Signed)
Discussed importance of healthy weight management Discussed diet and exercise  

## 2018-10-17 NOTE — Progress Notes (Signed)
Patient: Alexa Newman, Female    DOB: 1961/01/06, 58 y.o.   MRN: [id number] Visit Date: 10/17/2018  Today's Provider: [name], MD   Chief Complaint  Patient presents with   Annual Exam   Subjective:    I, [name] CMA, am acting as a scribe for [name], MD.    Annual physical exam Alexa Newman is a 58 y.o. female who presents today for health maintenance and complete physical. She feels well. She reports exercising:walking & yardwork. She reports she is sleeping poorly.  -----------------------------------------------------------------  Last Pap: 10/13/2017  Last mammogram: 02/14/2018  R knee - Painful and swelling intermittently Ongoing for2years, but worse in the last month In 2000 had ACL repair and in 2015 [place] out of her car and twisted knee Has seen an Ortho before and was told that she needed MRI, but didn't have one Feels unstable Worse with walking and moving and kneeling Gets stiff overnight Has tried ice - helps temporarily Taking meloxicam  Seeing Dr Shea Evans for bipolar disorder  Not interested in quitting smoking  Review of Systems  Constitutional: Negative.   HENT: Negative.   Eyes: Negative.   Respiratory: Negative.   Cardiovascular: Negative.   Gastrointestinal: Negative.   Endocrine: Negative.   Genitourinary: Negative.   Musculoskeletal: Positive for arthralgias, back pain, gait problem and joint swelling.  Skin: Negative.   Allergic/Immunologic: Negative.   Hematological: Negative.   Psychiatric/Behavioral: Negative.     Social History      She  reports that she has been smoking cigarettes. She has a 33.75 pack-year smoking history. She has never used smokeless tobacco. She reports current alcohol use. She reports previous drug use.       Social History   Socioeconomic History   Marital status: Divorced    Spouse name: Not on file   Number of children: 1   Years of education: Not on file    Highest education level: Some college, no degree  Occupational History   Occupation: disabled    Comment: on SSI awaiting decision about disability  Social Designer, fashion/clothing strain: Hard   Food insecurity    Worry: Often true    Inability: Often true   Transportation needs    Medical: Yes    Non-medical: Yes  Tobacco Use   Smoking status: Current Every Day Smoker    Packs/day: 0.75    Years: 45.00    Pack years: 33.75    Types: Cigarettes   Smokeless tobacco: Never Used   Tobacco comment: started smoking at age 8  Substance and Sexual Activity   Alcohol use: Yes    Comment: 1-2 times per month   Drug use: Not Currently    Comment: former crack cocaine user   Sexual activity: Yes    Partners: Male    Birth control/protection: Post-menopausal, Surgical  Lifestyle   Physical activity    Days per week: 4 days    Minutes per session: 20 min   Stress: Very much  Relationships   Social connections    Talks on phone: More than three times a week    Gets together: Once a week    Attends religious service: Never    Active member of club or organization: No    Attends meetings of clubs or organizations: Never    Relationship status: Married  Other Topics Concern   Not on file  Social History Narrative   Not on file  Past Medical History:  Diagnosis Date   Anxiety    Arthritis    Bipolar 1 disorder ([place])    Depression    Osteoarthritis      Patient Active Problem List   Diagnosis Date Noted   Overweight 10/17/2018   Bipolar 1 disorder, mixed, moderate ([place]) 10/07/2018   Hx of borderline personality disorder 10/07/2018   Cocaine use disorder, moderate, in sustained remission ([place]) 10/07/2018   Insomnia due to mental condition 10/07/2018   Panic attacks 10/07/2018   Benign neoplasm of ascending colon    Internal hemorrhoids    Polyp of sigmoid colon    Intestinal lump    Encounter for monitoring chronic NSAID therapy  07/13/2017   Tobacco use disorder 07/13/2017   Essential hypertension 07/13/2017   Bipolar 1 disorder ([place])    Anxiety    Arthritis     Past Surgical History:  Procedure Laterality Date   ANTERIOR CRUCIATE LIGAMENT (ACL) REVISION Right    CARPAL TUNNEL RELEASE     COLONOSCOPY     COLONOSCOPY WITH PROPOFOL N/A 11/17/2017   Procedure: COLONOSCOPY WITH PROPOFOL;  Surgeon: Virgel Manifold, MD;  Location: [facility] ENDOSCOPY;  Service: Endoscopy;  Laterality: N/A;   DILATION AND CURETTAGE OF UTERUS  2013   for heavy bleeding   ENDOMETRIAL ABLATION  2013   TUBAL LIGATION     WRIST ARTHROSCOPY Left 1990   for degenerative changes    Family History        Family Status  Relation Name Status   Mother  Deceased   Father  Deceased   Sister  Alive   Brother  Alive   Sister  Alive   Sister  Alive   Sister  Alive   Brother  Alive   MGF  (Not Specified)   Neg Hx  (Not Specified)        Her family history includes Alcohol abuse in her brother, brother, and sister; Anxiety disorder in her brother, brother, and sister; Bipolar disorder in her sister; Breast cancer (age of onset: 62) in her sister; Colon polyps in her brother and brother; Congestive Heart Failure in her father; Crohn's disease in her mother; Depression in her brother, brother, mother, and sister; Diabetes in her mother; Drug abuse in her brother, brother, and sister; HIV in her brother and brother; Hypertension in her mother; Lung cancer (age of onset: 74) in her mother; Skin cancer in her maternal grandfather. There is no history of Colon cancer, Ovarian cancer, or Cervical cancer.      Allergies  Allergen Reactions   Pregabalin     Other reaction(s): Other (See Comments) Suicidal ideation     Current Outpatient Medications:    amLODipine (NORVASC) 5 MG tablet, Take 1 tablet (5 mg total) by mouth daily., Disp: 90 tablet, Rfl: 3   doxepin (SINEQUAN) 10 MG capsule, Take 1-2 capsules (10-20 mg  total) by mouth at bedtime as needed. For sleep, Disp: 60 capsule, Rfl: 2   hydrOXYzine (ATARAX/VISTARIL) 25 MG tablet, Take 1 tablet (25 mg total) by mouth 3 (three) times daily as needed. For anxiety attacks, Disp: 45 tablet, Rfl: 1   lamoTRIgine (LAMICTAL) 150 MG tablet, TAKE 1 TABLET BY MOUTH EVERY DAY, Disp: 30 tablet, Rfl: 2   meloxicam (MOBIC) 15 MG tablet, Take 1 tablet (15 mg total) by mouth daily., Disp: 30 tablet, Rfl: 5   Patient Care Team: Virginia Crews, MD as PCP - General (Family Medicine)    Objective:  Vitals: BP (!) 154/94 (BP Location: Left Arm, Patient Position: Sitting, Cuff Size: Normal)    Pulse 87    Temp 98.4 F (36.9 C) (Oral)    Wt 169 lb 6.4 oz (76.8 kg)    LMP 12/07/2011    SpO2 99%    BMI 29.08 kg/m    Vitals:   10/17/18 1013 10/17/18 1323  BP: (!) 153/95 (!) 154/94  Pulse: 87   Temp: 98.4 F (36.9 C)   TempSrc: Oral   SpO2: 99%   Weight: 169 lb 6.4 oz (76.8 kg)      Physical Exam Vitals signs reviewed.  Constitutional:      General: She is not in acute distress.    Appearance: Normal appearance. She is well-developed. She is not diaphoretic.  HENT:     Head: Normocephalic and atraumatic.     Right Ear: Tympanic membrane, ear canal and external ear normal.     Left Ear: Tympanic membrane, ear canal and external ear normal.  Eyes:     General: No scleral icterus.    Conjunctiva/sclera: Conjunctivae normal.     Pupils: Pupils are equal, round, and reactive to light.  Neck:     Musculoskeletal: Neck supple.     Thyroid: No thyromegaly.  Cardiovascular:     Rate and Rhythm: Normal rate and regular rhythm.     Heart sounds: Normal heart sounds. No murmur.  Pulmonary:     Effort: Pulmonary effort is normal. No respiratory distress.     Breath sounds: Normal breath sounds. No wheezing or rales.  Abdominal:     General: Bowel sounds are normal. There is no distension.     Palpations: Abdomen is soft.     Tenderness: There is no  abdominal tenderness. There is no guarding or rebound.  Musculoskeletal:        General: No deformity.     Right lower leg: No edema.     Left lower leg: No edema.     Comments: R Knee: Normal to inspection with no erythema or effusion or obvious bony abnormalities.  Palpation normal with no warmth or patellar tenderness or condyle tenderness. Some lateral jjoint line tenderness ROM normal in flexion and extension and lower leg rotation. Ligaments with solid consistent endpoints including ACL, PCL, LCL, MCL. Negative Mcmurray's and provocative meniscal tests. Non painful patellar compression. Patellar and quadriceps tendons unremarkable. Hamstring and quadriceps strength is normal.   Lymphadenopathy:     Cervical: No cervical adenopathy.  Skin:    General: Skin is warm and dry.     Capillary Refill: Capillary refill takes less than 2 seconds.     Findings: No rash.  Neurological:     Mental Status: She is alert and oriented to person, place, and time.  Psychiatric:        Mood and Affect: Mood normal.        Behavior: Behavior normal.        Thought Content: Thought content normal.      Depression Screen PHQ 2/9 Scores 10/17/2018 07/13/2017  PHQ - 2 Score 0 2  PHQ- 9 Score 3 11      Assessment & Plan:     Routine Health Maintenance and Physical Exam  Exercise Activities and Dietary recommendations Goals   None     Immunization History  Administered Date(s) Administered   Influenza,inj,Quad PF,6+ Mos 01/13/2018   Tdap 09/20/2009    Health Maintenance  Topic Date Due   INFLUENZA VACCINE  10/08/2018  TETANUS/TDAP  09/21/2019   MAMMOGRAM  02/10/2020   PAP SMEAR-Modifier  10/14/2022   COLONOSCOPY  11/18/2022   Hepatitis C Screening  Completed   HIV Screening  Completed     Discussed health benefits of physical activity, and encouraged her to engage in regular exercise appropriate for her age and condition.      --------------------------------------------------------------------  Problem List Items Addressed This Visit      Cardiovascular and Mediastinum   Essential hypertension    Uncontrolled Patient believes this is related to excessive caffeine intake this AM No change to meds today Will f/u in 1 month and if still elevated, will need to make some changes      Relevant Orders   Comprehensive metabolic panel   Lipid panel     Other   Bipolar 1 disorder ([place])    Followed by Psych [facility]      Encounter for monitoring chronic NSAID therapy   Relevant Orders   Comprehensive metabolic panel   Tobacco use disorder    Discussed importance of cessation and risks of continuing to smoke Patient does not want to quit smoking      Overweight    Discussed importance of healthy weight management Discussed diet and exercise       Relevant Orders   Comprehensive metabolic panel   Lipid panel    Other Visit Diagnoses    Encounter for annual physical exam    -  Primary   Hyperglycemia       Relevant Orders   Lipid panel   Hemoglobin A1c   Chronic pain of right knee       Relevant Orders   Ambulatory referral to Orthopedic Surgery   Knee swelling       Relevant Orders   Ambulatory referral to Orthopedic Surgery     - chronic and ongoing problems - no obvious abnormality on exam today - referral to Ortho for further eval and management    Return in about 4 weeks (around 11/14/2018) for BP F/u.   The entirety of the information documented in the History of Present Illness, Review of Systems and Physical Exam were personally obtained by me. Portions of this information were initially documented by [facility], CMA and reviewed by me for thoroughness and accuracy.    Emre Stock, Dionne Bucy, MD MPH [facility]

## 2018-10-18 ENCOUNTER — Telehealth: Payer: Self-pay

## 2018-10-18 DIAGNOSIS — E876 Hypokalemia: Secondary | ICD-10-CM

## 2018-10-18 LAB — HEMOGLOBIN A1C
Est. average glucose Bld gHb Est-mCnc: 114 mg/dL
Hgb A1c MFr Bld: 5.6 % (ref 4.8–5.6)

## 2018-10-18 LAB — LIPID PANEL
Chol/HDL Ratio: 3.2 ratio (ref 0.0–4.4)
Cholesterol, Total: 189 mg/dL (ref 100–199)
HDL: 59 mg/dL (ref >39)
LDL Calculated: 101 mg/dL — ABNORMAL HIGH (ref 0–99)
Triglycerides: 144 mg/dL (ref 0–149)
VLDL Cholesterol Cal: 29 mg/dL (ref 5–40)

## 2018-10-18 LAB — COMPREHENSIVE METABOLIC PANEL
ALT: 15 IU/L (ref 0–32)
AST: 19 IU/L (ref 0–40)
Albumin/Globulin Ratio: 1.9 (ref 1.2–2.2)
Albumin: 4.5 g/dL (ref 3.8–4.9)
Alkaline Phosphatase: 112 IU/L (ref 39–117)
BUN/Creatinine Ratio: 19 (ref 9–23)
BUN: 16 mg/dL (ref 6–24)
Bilirubin Total: 0.4 mg/dL (ref 0.0–1.2)
CO2: 25 mmol/L (ref 20–29)
Calcium: 9.8 mg/dL (ref 8.7–10.2)
Chloride: 100 mmol/L (ref 96–106)
Creatinine, Ser: 0.84 mg/dL (ref 0.57–1.00)
GFR calc Af Amer: 89 mL/min/{1.73_m2} (ref >59)
GFR calc non Af Amer: 77 mL/min/{1.73_m2} (ref >59)
Globulin, Total: 2.4 g/dL (ref 1.5–4.5)
Glucose: 91 mg/dL (ref 65–99)
Potassium: 3.3 mmol/L — ABNORMAL LOW (ref 3.5–5.2)
Sodium: 142 mmol/L (ref 134–144)
Total Protein: 6.9 g/dL (ref 6.0–8.5)

## 2018-10-18 NOTE — Telephone Encounter (Signed)
-----   Message from Virginia Crews, MD sent at 10/18/2018  8:54 AM EDT ----- Normal labs, except potassium is slightly low.  Each foods high in potassium (citrus fruit, bananas, etc) and recheck in 2 weeks.

## 2018-10-18 NOTE — Telephone Encounter (Signed)
Patient advised. Lab slip printed at the front desk for pick up.

## 2018-10-18 NOTE — Telephone Encounter (Signed)
LMTCB

## 2018-10-19 ENCOUNTER — Other Ambulatory Visit: Payer: Self-pay | Admitting: Family Medicine

## 2018-10-21 DIAGNOSIS — M1731 Unilateral post-traumatic osteoarthritis, right knee: Secondary | ICD-10-CM | POA: Diagnosis not present

## 2018-10-21 DIAGNOSIS — M1711 Unilateral primary osteoarthritis, right knee: Secondary | ICD-10-CM | POA: Insufficient documentation

## 2018-10-21 DIAGNOSIS — M5412 Radiculopathy, cervical region: Secondary | ICD-10-CM | POA: Diagnosis not present

## 2018-10-28 ENCOUNTER — Other Ambulatory Visit: Payer: Self-pay

## 2018-10-28 ENCOUNTER — Telehealth: Payer: Self-pay | Admitting: Family Medicine

## 2018-10-28 ENCOUNTER — Ambulatory Visit (INDEPENDENT_AMBULATORY_CARE_PROVIDER_SITE_OTHER): Payer: Medicaid Other | Admitting: Family Medicine

## 2018-10-28 ENCOUNTER — Encounter: Payer: Self-pay | Admitting: Family Medicine

## 2018-10-28 VITALS — BP 152/88 | HR 97 | Temp 96.6°F | Resp 18 | Wt 170.0 lb

## 2018-10-28 DIAGNOSIS — S134XXA Sprain of ligaments of cervical spine, initial encounter: Secondary | ICD-10-CM | POA: Diagnosis not present

## 2018-10-28 MED ORDER — PREDNISONE 10 MG PO TABS: ORAL_TABLET | ORAL | 0 refills | Status: AC

## 2018-10-28 NOTE — Patient Instructions (Signed)
.   Please review the attached list of medications and notify my office if there are any errors.   . Please bring all of your medications to every appointment so we can make sure that our medication list is the same as yours.   . We will have flu vaccines available after Labor Day. Please go to your pharmacy or call the office in early September to schedule you flu shot.   

## 2018-10-28 NOTE — Progress Notes (Signed)
Patient: Alexa Newman Female    DOB: 12-25-1960   58 y.o.   MRN: [id number] Visit Date: 10/28/2018  Today's Provider: Lelon Huh, MD   Chief Complaint  Patient presents with  . Shoulder Pain   Subjective:     Shoulder Pain  The pain is present in the left shoulder. This is a new problem. Episode onset: 2 weeks ago. The problem occurs constantly. The problem has been gradually worsening. The quality of the pain is described as sharp. The pain is at a severity of 10/10. The pain is severe. Associated symptoms include numbness (in left shoulder). Pertinent negatives include no fever. She has tried heat and rest (also Icey hot) for the symptoms. The treatment provided mild relief.  Pain started after lifting items at a flea market. Had visit with Dr. Sabra Heck one week ago for knee pain at which time she had knee injection, but also reports he had xray of shoulder done and prescribed methocarbimol, which has not helped. She also take meloxicam daily for back and knee pain, but has not helped shoulder. Has tried tylenol which didn't help at all. Denies any neck or spine pain. Pain isn't too bad when she first gets up in morning, but becomes severe as day goes by   Allergies  Allergen Reactions  . Pregabalin     Other reaction(s): Other (See Comments) Suicidal ideation     Current Outpatient Medications:  .  amLODipine (NORVASC) 5 MG tablet, Take 1 tablet (5 mg total) by mouth daily., Disp: 90 tablet, Rfl: 3 .  doxepin (SINEQUAN) 10 MG capsule, Take 1-2 capsules (10-20 mg total) by mouth at bedtime as needed. For sleep, Disp: 60 capsule, Rfl: 2 .  lamoTRIgine (LAMICTAL) 150 MG tablet, TAKE 1 TABLET BY MOUTH EVERY DAY, Disp: 30 tablet, Rfl: 2 .  meloxicam (MOBIC) 15 MG tablet, TAKE 1 TABLET BY MOUTH EVERY DAY, Disp: 30 tablet, Rfl: 5 .  methocarbamol (ROBAXIN) 500 MG tablet, Take 500 mg by mouth 2 (two) times daily., Disp: , Rfl:  .  hydrOXYzine (ATARAX/VISTARIL) 25 MG tablet, Take  1 tablet (25 mg total) by mouth 3 (three) times daily as needed. For anxiety attacks (Patient not taking: Reported on 10/28/2018), Disp: 45 tablet, Rfl: 1  Review of Systems  Constitutional: Negative for appetite change, chills, fatigue and fever.  Respiratory: Negative for chest tightness and shortness of breath.   Cardiovascular: Negative for chest pain and palpitations.  Gastrointestinal: Negative for abdominal pain, nausea and vomiting.  Musculoskeletal: Positive for myalgias (left shoulder pain).  Neurological: Positive for numbness (in left shoulder). Negative for dizziness and weakness.    Social History   Tobacco Use  . Smoking status: Current Every Day Smoker    Packs/day: 1.00    Years: 45.00    Pack years: 45.00    Types: Cigarettes  . Smokeless tobacco: Never Used  . Tobacco comment: started smoking at age 61  Substance Use Topics  . Alcohol use: Yes    Comment: 1-2 times per month       Objective:   BP (!) 152/88 (BP Location: Right Arm, Cuff Size: Large)   Pulse 97   Temp (!) 96.6 F (35.9 C) (Temporal)   Resp 18   Wt 170 lb (77.1 kg)   LMP 12/07/2011   SpO2 99% Comment: room air  BMI 29.18 kg/m  Vitals:   10/28/18 1552 10/28/18 1553  BP: (!) 158/84 (!) 152/88  Pulse: 97  Resp: 18   Temp: (!) 96.6 F (35.9 C)   TempSrc: Temporal   SpO2: 99%   Weight: 170 lb (77.1 kg)      Physical Exam   General Appearance:    Alert, cooperative, no distress  Eyes:    PERRL, conjunctiva/corneas clear, EOM's intact       Lungs:     Clear to auscultation bilaterally, respirations unlabored  Heart:    Normal heart rate. Normal rhythm. No murmurs, rubs, or gallops.   MS:   All extremities are intact. FROM with slightly pain elicited at limits of ROM. Point tenderness over spine at cervical-thoracic junction. No swelling. No erythema. +5 strength both arms.   Neurologic:   Awake, alert, oriented x 3. No apparent focal neurological           defect.            Assessment & Plan     1. Sprain of ligaments of cervical spine, initial encounter Relative rest. No overhead lifting.  - predniSONE (DELTASONE) 10 MG tablet; 6 tablets for 2 days, then 5 for 2 days, then 4 for 2 days, then 3 for 2 days, then 2 for 2 days, then 1 for 2 days.  Dispense: 42 tablet; Refill: 0  Call if symptoms change or if not rapidly improving.         Lelon Huh, MD  [facility]

## 2018-11-10 ENCOUNTER — Other Ambulatory Visit: Payer: Self-pay

## 2018-11-10 MED ORDER — AMLODIPINE BESYLATE 5 MG PO TABS: 5.0000 mg | ORAL_TABLET | Freq: Every day | ORAL | 0 refills | Status: DC

## 2018-11-10 NOTE — Telephone Encounter (Signed)
BP is likely up from being out of medication.  Will resume same dose and f/u as scheduled

## 2018-11-10 NOTE — Telephone Encounter (Signed)
Patient called requesting a refill. She has been out of medication since 11/01/2018. She say her pharmacy has tried contacting us for refill. Patient says her blood pressure was 167/100 after walking up the steps and smoking a cigarette. She has had a dull headache. I was going to send in a refill, but I wanted to check with you to make sure you didn't want to increase medication or something else. Patient has a follow up appointment scheduled 11/17/2018. Patient denies chest pain, blurred vision, numbness or shortness of breath.

## 2018-11-11 NOTE — Telephone Encounter (Signed)
Patient advised.

## 2018-11-15 ENCOUNTER — Other Ambulatory Visit: Payer: Self-pay | Admitting: Psychiatry

## 2018-11-15 DIAGNOSIS — F3162 Bipolar disorder, current episode mixed, moderate: Secondary | ICD-10-CM

## 2018-11-16 NOTE — Progress Notes (Signed)
Patient: Alexa Newman Female    DOB: 08/10/1960   58 y.o.   MRN: [id number] Visit Date: 11/17/2018  Today's Provider: [name], MD   Chief Complaint  Patient presents with  . Hypertension   Subjective:    I, Tiburcio Pea, CMA, am acting as a Education administrator for [name], MD.    HPI  Hypertension, follow-up:  BP Readings from Last 3 Encounters:  11/17/18 (!) 176/97  10/28/18 (!) 152/88  10/17/18 (!) 154/94    She was last seen for hypertension 1 months ago.  BP at that visit was 154/94. Management changes since that visit include no change. She reports good compliance with treatment. Patient states she has been taking 2 tablets of Amlodipine 5 mg. She is not having side effects.  She is not exercising. She is not adherent to low salt diet.   Outside blood pressures are being checked at home. She is experiencing none.  Patient denies chest pain, chest pressure/discomfort, claudication, dyspnea, exertional chest pressure/discomfort, fatigue, irregular heart beat, lower extremity edema, near-syncope, orthopnea, palpitations, paroxysmal nocturnal dyspnea, syncope and tachypnea.   Cardiovascular risk factors include hypertension.  Use of agents associated with hypertension: none.     Weight trend: stable Wt Readings from Last 3 Encounters:  11/17/18 168 lb 9.6 oz (76.5 kg)  10/28/18 170 lb (77.1 kg)  10/17/18 169 lb 6.4 oz (76.8 kg)   ------------------------------------------------------------------------  Patient reports she is also concerned about a mole on her back that is been present and growing over the last 1 to 2 years.  She states that she had a place frozen off in this area previously.  She states it is occasionally painful when her bra line rubs it.  It is on her mid back to the left side.  She has not noticed any color change.   Allergies  Allergen Reactions  . Pregabalin     Other reaction(s): Other (See Comments) Suicidal ideation      Current Outpatient Medications:  .  amLODipine (NORVASC) 10 MG tablet, Take 1 tablet (10 mg total) by mouth daily., Disp: 90 tablet, Rfl: 1 .  doxepin (SINEQUAN) 10 MG capsule, Take 1-2 capsules (10-20 mg total) by mouth at bedtime as needed. For sleep, Disp: 60 capsule, Rfl: 2 .  lamoTRIgine (LAMICTAL) 150 MG tablet, TAKE 1 TABLET BY MOUTH EVERY DAY, Disp: 30 tablet, Rfl: 2 .  meloxicam (MOBIC) 15 MG tablet, TAKE 1 TABLET BY MOUTH EVERY DAY, Disp: 30 tablet, Rfl: 5 .  methocarbamol (ROBAXIN) 500 MG tablet, Take 500 mg by mouth 2 (two) times daily., Disp: , Rfl:  .  hydrochlorothiazide (HYDRODIURIL) 12.5 MG tablet, Take 1 tablet (12.5 mg total) by mouth daily., Disp: 30 tablet, Rfl: 3  Review of Systems  Constitutional: Negative.   Respiratory: Negative.   Cardiovascular: Negative.   Genitourinary: Negative.   Musculoskeletal: Negative.   Neurological: Negative.     Social History   Tobacco Use  . Smoking status: Current Every Day Smoker    Packs/day: 1.00    Years: 45.00    Pack years: 45.00    Types: Cigarettes  . Smokeless tobacco: Never Used  . Tobacco comment: started smoking at age 20  Substance Use Topics  . Alcohol use: Yes    Comment: 1-2 times per month      Objective:   BP (!) 176/97 (BP Location: Right Arm, Patient Position: Sitting, Cuff Size: Normal)   Pulse 99   Temp  97.7 F (36.5 C) (Oral)   Wt 168 lb 9.6 oz (76.5 kg)   LMP 12/07/2011   SpO2 99%   BMI 28.94 kg/m  Vitals:   11/17/18 0808  BP: (!) 176/97  Pulse: 99  Temp: 97.7 F (36.5 C)  TempSrc: Oral  SpO2: 99%  Weight: 168 lb 9.6 oz (76.5 kg)  Body mass index is 28.94 kg/m.   Physical Exam Vitals signs reviewed.  Constitutional:      General: She is not in acute distress.    Appearance: Normal appearance. She is well-developed. She is not diaphoretic.  HENT:     Head: Normocephalic and atraumatic.  Eyes:     General: No scleral icterus.    Conjunctiva/sclera: Conjunctivae normal.   Neck:     Musculoskeletal: Neck supple.     Thyroid: No thyromegaly.  Cardiovascular:     Rate and Rhythm: Normal rate and regular rhythm.     Pulses: Normal pulses.     Heart sounds: Normal heart sounds. No murmur.  Pulmonary:     Effort: Pulmonary effort is normal. No respiratory distress.     Breath sounds: Normal breath sounds. No wheezing, rhonchi or rales.  Musculoskeletal:     Right lower leg: No edema.     Left lower leg: No edema.  Lymphadenopathy:     Cervical: No cervical adenopathy.  Skin:    General: Skin is warm and dry.     Capillary Refill: Capillary refill takes less than 2 seconds.     Findings: No rash.     Comments: 0.5 cm pedunculated skin colored skin lesion on left mid back, no tenderness to palpation.   Neurological:     Mental Status: She is alert and oriented to person, place, and time. Mental status is at baseline.  Psychiatric:        Mood and Affect: Mood normal.        Behavior: Behavior normal.      No results found for any visits on 11/17/18.     Assessment & Plan   Problem List Items Addressed This Visit      Cardiovascular and Mediastinum   Essential hypertension - Primary    Uncontrolled Continue amlodipine 10 mg daily Add HCTZ 12.5 mg daily Discussed potential side effects Repeat metabolic panel today as she had slight hypokalemia on last visit Follow-up in 1 month and consider dose titration      Relevant Medications   hydrochlorothiazide (HYDRODIURIL) 12.5 MG tablet   amLODipine (NORVASC) 10 MG tablet   Other Relevant Orders   Renal Function Panel    Other Visit Diagnoses    Hypokalemia       Relevant Orders   Renal Function Panel   Skin abnormality     -Patient appears to have a large skin tag on her back that she would like removed, but as she has had" precancerous" lesions, per report, removed in the past, will refer her to dermatology -Given her smoking history and significant sun exposure, it is reasonable for her  to have an annual skin check anyways   Relevant Orders   Ambulatory referral to Dermatology       Return in about 4 weeks (around 12/15/2018) for BP f/u.   The entirety of the information documented in the History of Present Illness, Review of Systems and Physical Exam were personally obtained by me. Portions of this information were initially documented by Tiburcio Pea, CMA and reviewed by me for thoroughness and accuracy.  Samara Stankowski, Dionne Bucy, MD MPH [facility]

## 2018-11-17 ENCOUNTER — Encounter: Payer: Self-pay | Admitting: Family Medicine

## 2018-11-17 ENCOUNTER — Ambulatory Visit: Payer: Medicaid Other | Admitting: Family Medicine

## 2018-11-17 ENCOUNTER — Other Ambulatory Visit: Payer: Self-pay

## 2018-11-17 VITALS — BP 157/89 | HR 99 | Temp 97.7°F | Wt 168.6 lb

## 2018-11-17 DIAGNOSIS — Z23 Encounter for immunization: Secondary | ICD-10-CM | POA: Diagnosis not present

## 2018-11-17 DIAGNOSIS — L989 Disorder of the skin and subcutaneous tissue, unspecified: Secondary | ICD-10-CM | POA: Diagnosis not present

## 2018-11-17 DIAGNOSIS — I10 Essential (primary) hypertension: Secondary | ICD-10-CM | POA: Diagnosis not present

## 2018-11-17 DIAGNOSIS — E876 Hypokalemia: Secondary | ICD-10-CM

## 2018-11-17 MED ORDER — AMLODIPINE BESYLATE 10 MG PO TABS: 10.0000 mg | ORAL_TABLET | Freq: Every day | ORAL | 1 refills | Status: DC

## 2018-11-17 MED ORDER — HYDROCHLOROTHIAZIDE 12.5 MG PO TABS: 12.5000 mg | ORAL_TABLET | Freq: Every day | ORAL | 3 refills | Status: DC

## 2018-11-17 NOTE — Assessment & Plan Note (Signed)
Uncontrolled Continue amlodipine 10 mg daily Add HCTZ 12.5 mg daily Discussed potential side effects Repeat metabolic panel today as she had slight hypokalemia on last visit Follow-up in 1 month and consider dose titration

## 2018-11-17 NOTE — Patient Instructions (Signed)

## 2018-11-18 ENCOUNTER — Other Ambulatory Visit: Payer: Self-pay | Admitting: Family Medicine

## 2018-11-18 LAB — RENAL FUNCTION PANEL
Albumin: 4.3 g/dL (ref 3.8–4.9)
BUN/Creatinine Ratio: 20 (ref 9–23)
BUN: 14 mg/dL (ref 6–24)
CO2: 22 mmol/L (ref 20–29)
Calcium: 9.7 mg/dL (ref 8.7–10.2)
Chloride: 103 mmol/L (ref 96–106)
Creatinine, Ser: 0.71 mg/dL (ref 0.57–1.00)
GFR calc Af Amer: 109 mL/min/{1.73_m2} (ref >59)
GFR calc non Af Amer: 95 mL/min/{1.73_m2} (ref >59)
Glucose: 89 mg/dL (ref 65–99)
Phosphorus: 2.8 mg/dL — ABNORMAL LOW (ref 3.0–4.3)
Potassium: 3.3 mmol/L — ABNORMAL LOW (ref 3.5–5.2)
Sodium: 141 mmol/L (ref 134–144)

## 2018-11-18 MED ORDER — TRIAMTERENE-HCTZ 37.5-25 MG PO TABS: 0.5000 | ORAL_TABLET | Freq: Every day | ORAL | 3 refills | Status: DC

## 2018-12-16 ENCOUNTER — Ambulatory Visit (INDEPENDENT_AMBULATORY_CARE_PROVIDER_SITE_OTHER): Payer: Medicaid Other | Admitting: Family Medicine

## 2018-12-16 ENCOUNTER — Encounter: Payer: Self-pay | Admitting: Family Medicine

## 2018-12-16 ENCOUNTER — Other Ambulatory Visit: Payer: Self-pay

## 2018-12-16 VITALS — BP 161/91 | HR 91 | Temp 96.9°F | Wt 166.0 lb

## 2018-12-16 DIAGNOSIS — I10 Essential (primary) hypertension: Secondary | ICD-10-CM

## 2018-12-16 DIAGNOSIS — R631 Polydipsia: Secondary | ICD-10-CM | POA: Diagnosis not present

## 2018-12-16 DIAGNOSIS — E876 Hypokalemia: Secondary | ICD-10-CM

## 2018-12-16 MED ORDER — TRIAMTERENE-HCTZ 37.5-25 MG PO TABS: 1.0000 | ORAL_TABLET | Freq: Every day | ORAL | 3 refills | Status: DC

## 2018-12-16 NOTE — Patient Instructions (Addendum)
[facility]     [phone]   Hypertension, Adult High blood pressure (hypertension) is when the force of blood pumping through the arteries is too strong. The arteries are the blood vessels that carry blood from the heart throughout the body. Hypertension forces the heart to work harder to pump blood and may cause arteries to become narrow or stiff. Untreated or uncontrolled hypertension can cause a heart attack, heart failure, a stroke, kidney disease, and other problems. A blood pressure reading consists of a higher number over a lower number. Ideally, your blood pressure should be below 120/80. The first ("top") number is called the systolic pressure. It is a measure of the pressure in your arteries as your heart beats. The second ("bottom") number is called the diastolic pressure. It is a measure of the pressure in your arteries as the heart relaxes. What are the causes? The exact cause of this condition is not known. There are some conditions that result in or are related to high blood pressure. What increases the risk? Some risk factors for high blood pressure are under your control. The following factors may make you more likely to develop this condition:  Smoking.  Having type 2 diabetes mellitus, high cholesterol, or both.  Not getting enough exercise or physical activity.  Being overweight.  Having too much fat, sugar, calories, or salt (sodium) in your diet.  Drinking too much alcohol. Some risk factors for high blood pressure may be difficult or impossible to change. Some of these factors include:  Having chronic kidney disease.  Having a family history of high blood pressure.  Age. Risk increases with age.  Race. You may be at higher risk if you are African American.  Gender. Men are at higher risk than women before age 97. After age 55, women are at higher risk than men.  Having obstructive sleep apnea.  Stress. What are the signs or symptoms? High  blood pressure may not cause symptoms. Very high blood pressure (hypertensive crisis) may cause:  Headache.  Anxiety.  Shortness of breath.  Nosebleed.  Nausea and vomiting.  Vision changes.  Severe chest pain.  Seizures. How is this diagnosed? This condition is diagnosed by measuring your blood pressure while you are seated, with your arm resting on a flat surface, your legs uncrossed, and your feet flat on the floor. The cuff of the blood pressure monitor will be placed directly against the skin of your upper arm at the level of your heart. It should be measured at least twice using the same arm. Certain conditions can cause a difference in blood pressure between your right and left arms. Certain factors can cause blood pressure readings to be lower or higher than normal for a short period of time:  When your blood pressure is higher when you are in a health care provider's office than when you are at home, this is called white coat hypertension. Most people with this condition do not need medicines.  When your blood pressure is higher at home than when you are in a health care provider's office, this is called masked hypertension. Most people with this condition may need medicines to control blood pressure. If you have a high blood pressure reading during one visit or you have normal blood pressure with other risk factors, you may be asked to:  Return on a different day to have your blood pressure checked again.  Monitor your blood pressure at home for 1 week or longer. If  you are diagnosed with hypertension, you may have other blood or imaging tests to help your health care provider understand your overall risk for other conditions. How is this treated? This condition is treated by making healthy lifestyle changes, such as eating healthy foods, exercising more, and reducing your alcohol intake. Your health care provider may prescribe medicine if lifestyle changes are not enough to  get your blood pressure under control, and if:  Your systolic blood pressure is above 130.  Your diastolic blood pressure is above 80. Your personal target blood pressure may vary depending on your medical conditions, your age, and other factors. Follow these instructions at home: Eating and drinking   Eat a diet that is high in fiber and potassium, and low in sodium, added sugar, and fat. An example eating plan is called the DASH (Dietary Approaches to Stop Hypertension) diet. To eat this way: ? Eat plenty of fresh fruits and vegetables. Try to fill one half of your plate at each meal with fruits and vegetables. ? Eat whole grains, such as whole-wheat pasta, brown rice, or whole-grain bread. Fill about one fourth of your plate with whole grains. ? Eat or drink low-fat dairy products, such as skim milk or low-fat yogurt. ? Avoid fatty cuts of meat, processed or cured meats, and poultry with skin. Fill about one fourth of your plate with lean proteins, such as fish, chicken without skin, beans, eggs, or tofu. ? Avoid pre-made and processed foods. These tend to be higher in sodium, added sugar, and fat.  Reduce your daily sodium intake. Most people with hypertension should eat less than 1,500 mg of sodium a day.  Do not drink alcohol if: ? Your health care provider tells you not to drink. ? You are pregnant, may be pregnant, or are planning to become pregnant.  If you drink alcohol: ? Limit how much you use to:  0-1 drink a day for women.  0-2 drinks a day for men. ? Be aware of how much alcohol is in your drink. In the [place] one drink equals one 12 oz bottle of beer (355 mL), one 5 oz glass of wine (148 mL), or one 1 oz glass of hard liquor (44 mL). Lifestyle   Work with your health care provider to maintain a healthy body weight or to lose weight. Ask what an ideal weight is for you.  Get at least 30 minutes of exercise most days of the week. Activities may include walking,  swimming, or biking.  Include exercise to strengthen your muscles (resistance exercise), such as Pilates or lifting weights, as part of your weekly exercise routine. Try to do these types of exercises for 30 minutes at least 3 days a week.  Do not use any products that contain nicotine or tobacco, such as cigarettes, e-cigarettes, and chewing tobacco. If you need help quitting, ask your health care provider.  Monitor your blood pressure at home as told by your health care provider.  Keep all follow-up visits as told by your health care provider. This is important. Medicines  Take over-the-counter and prescription medicines only as told by your health care provider. Follow directions carefully. Blood pressure medicines must be taken as prescribed.  Do not skip doses of blood pressure medicine. Doing this puts you at risk for problems and can make the medicine less effective.  Ask your health care provider about side effects or reactions to medicines that you should watch for. Contact a health care provider if you:  Think you are having a reaction to a medicine you are taking.  Have headaches that keep coming back (recurring).  Feel dizzy.  Have swelling in your ankles.  Have trouble with your vision. Get help right away if you:  Develop a severe headache or confusion.  Have unusual weakness or numbness.  Feel faint.  Have severe pain in your chest or abdomen.  Vomit repeatedly.  Have trouble breathing. Summary  Hypertension is when the force of blood pumping through your arteries is too strong. If this condition is not controlled, it may put you at risk for serious complications.  Your personal target blood pressure may vary depending on your medical conditions, your age, and other factors. For most people, a normal blood pressure is less than 120/80.  Hypertension is treated with lifestyle changes, medicines, or a combination of both. Lifestyle changes include losing  weight, eating a healthy, low-sodium diet, exercising more, and limiting alcohol. This information is not intended to replace advice given to you by your health care provider. Make sure you discuss any questions you have with your health care provider. Document Released: 02/23/2005 Document Revised: 11/03/2017 Document Reviewed: 11/03/2017 Elsevier Patient Education  2020 [employer].

## 2018-12-16 NOTE — Assessment & Plan Note (Signed)
Uncontrolled Continue amlodipine 10mg  daily Increase Maxzide to 1 tab daily Recheck RFP Discussed DASH diet F/u in 1 month

## 2018-12-16 NOTE — Progress Notes (Signed)
Patient: Alexa Newman Female    DOB: [birth year]   58 y.o.   MRN: [id number] Visit Date: 12/16/2018  Today's Provider: [name], MD   Chief Complaint  Patient presents with  . Hypertension   Subjective:    I, Tiburcio Pea, CMA, am acting as a Education administrator for [name], MD.    HPI  Hypertension, follow-up:     BP Readings from Last 3 Encounters:  11/17/18 (!) 176/97  10/28/18 (!) 152/88  10/17/18 (!) 154/94    She was last seen for hypertension 1 month ago.  BP at that visit was 176/97. Management changes since that visit include continue Amlodipine 10 mg and add HCTZ 12.5 mg daily.  She reports good compliance with treatment.  She is not having side effects.  She is not exercising. She is not adherent to low salt diet.   Outside blood pressures are being checked at home. She is experiencing none.  Patient denies chest pain, chest pressure/discomfort, claudication, dyspnea, exertional chest pressure/discomfort, fatigue, irregular heart beat, lower extremity edema, near-syncope, orthopnea, palpitations, paroxysmal nocturnal dyspnea, syncope and tachypnea.   Cardiovascular risk factors include hypertension.  Use of agents associated with hypertension: none.                Weight trend: stable    Wt Readings from Last 3 Encounters:  11/17/18 168 lb 9.6 oz (76.5 kg)  10/28/18 170 lb (77.1 kg)  10/17/18 169 lb 6.4 oz (76.8 kg)   ------------------------------------------------------------------------ Patient states that her daughter is concerned that she may have diabetes.  She states she has a sweet tooth and that she drinks a lot of water.   Allergies  Allergen Reactions  . Pregabalin     Other reaction(s): Other (See Comments) Suicidal ideation     Current Outpatient Medications:  .  amLODipine (NORVASC) 10 MG tablet, Take 1 tablet (10 mg total) by mouth daily., Disp: 90 tablet, Rfl: 1 .  doxepin (SINEQUAN) 10 MG capsule, Take 1-2  capsules (10-20 mg total) by mouth at bedtime as needed. For sleep, Disp: 60 capsule, Rfl: 2 .  lamoTRIgine (LAMICTAL) 150 MG tablet, TAKE 1 TABLET BY MOUTH EVERY DAY, Disp: 30 tablet, Rfl: 2 .  meloxicam (MOBIC) 15 MG tablet, TAKE 1 TABLET BY MOUTH EVERY DAY, Disp: 30 tablet, Rfl: 5 .  methocarbamol (ROBAXIN) 500 MG tablet, Take 500 mg by mouth 2 (two) times daily., Disp: , Rfl:  .  triamterene-hydrochlorothiazide (MAXZIDE-25) 37.5-25 MG tablet, Take 0.5 tablets by mouth daily., Disp: 15 tablet, Rfl: 3  Review of Systems  Constitutional: Negative.   Respiratory: Negative.   Cardiovascular: Negative.   Musculoskeletal: Negative.     Social History   Tobacco Use  . Smoking status: Current Every Day Smoker    Packs/day: 1.00    Years: 45.00    Pack years: 45.00    Types: Cigarettes  . Smokeless tobacco: Never Used  . Tobacco comment: started smoking at age 36  Substance Use Topics  . Alcohol use: Yes    Comment: 1-2 times per month      Objective:   BP (!) 161/91 (BP Location: Right Arm, Patient Position: Sitting, Cuff Size: Normal)   Pulse 91   Temp (!) 96.9 F (36.1 C) (Temporal)   Wt 166 lb (75.3 kg)   LMP 12/07/2011   SpO2 98%   BMI 28.49 kg/m  Vitals:   12/16/18 0816  BP: (!) 161/91  Pulse: 91  Temp: (!) 96.9 F (36.1 C)  TempSrc: Temporal  SpO2: 98%  Weight: 166 lb (75.3 kg)  Body mass index is 28.49 kg/m.   Physical Exam Vitals signs reviewed.  Constitutional:      General: She is not in acute distress.    Appearance: Normal appearance. She is well-developed. She is not diaphoretic.  HENT:     Head: Normocephalic and atraumatic.  Eyes:     General: No scleral icterus.    Conjunctiva/sclera: Conjunctivae normal.  Neck:     Musculoskeletal: Neck supple.     Thyroid: No thyromegaly.  Cardiovascular:     Rate and Rhythm: Normal rate and regular rhythm.     Pulses: Normal pulses.     Heart sounds: Normal heart sounds. No murmur.  Pulmonary:      Effort: Pulmonary effort is normal. No respiratory distress.     Breath sounds: Normal breath sounds. No wheezing, rhonchi or rales.  Musculoskeletal:     Right lower leg: No edema.     Left lower leg: No edema.  Lymphadenopathy:     Cervical: No cervical adenopathy.  Skin:    General: Skin is warm and dry.     Capillary Refill: Capillary refill takes less than 2 seconds.     Findings: No rash.  Neurological:     Mental Status: She is alert and oriented to person, place, and time. Mental status is at baseline.  Psychiatric:        Mood and Affect: Mood normal.        Behavior: Behavior normal.      No results found for any visits on 12/16/18.     Assessment & Plan   Problem List Items Addressed This Visit      Cardiovascular and Mediastinum   Essential hypertension - Primary    Uncontrolled Continue amlodipine 10mg  daily Increase Maxzide to 1 tab daily Recheck RFP Discussed DASH diet F/u in 1 month      Relevant Medications   triamterene-hydrochlorothiazide (MAXZIDE-25) 37.5-25 MG tablet   Other Relevant Orders   Renal Function Panel    Other Visit Diagnoses    Polydipsia     -Discussed that this may be related to taking a diuretic -Previous glucose levels have been normal -We will check an A1c to reassure patient   Relevant Orders   Hemoglobin A1c   Hypokalemia       Relevant Orders   Renal Function Panel       Return in about 4 weeks (around 01/13/2019) for BP f/u.   The entirety of the information documented in the History of Present Illness, Review of Systems and Physical Exam were personally obtained by me. Portions of this information were initially documented by Tiburcio Pea, CMA and reviewed by me for thoroughness and accuracy.    Bacigalupo, Dionne Bucy, MD MPH [facility]

## 2018-12-17 LAB — RENAL FUNCTION PANEL
Albumin: 4.5 g/dL (ref 3.8–4.9)
BUN/Creatinine Ratio: 16 (ref 9–23)
BUN: 12 mg/dL (ref 6–24)
CO2: 24 mmol/L (ref 20–29)
Calcium: 9.7 mg/dL (ref 8.7–10.2)
Chloride: 102 mmol/L (ref 96–106)
Creatinine, Ser: 0.74 mg/dL (ref 0.57–1.00)
GFR calc Af Amer: 104 mL/min/{1.73_m2} (ref >59)
GFR calc non Af Amer: 90 mL/min/{1.73_m2} (ref >59)
Glucose: 85 mg/dL (ref 65–99)
Phosphorus: 3.4 mg/dL (ref 3.0–4.3)
Potassium: 3.4 mmol/L — ABNORMAL LOW (ref 3.5–5.2)
Sodium: 141 mmol/L (ref 134–144)

## 2018-12-17 LAB — HEMOGLOBIN A1C
Est. average glucose Bld gHb Est-mCnc: 117 mg/dL
Hgb A1c MFr Bld: 5.7 % — ABNORMAL HIGH (ref 4.8–5.6)

## 2018-12-19 ENCOUNTER — Ambulatory Visit (INDEPENDENT_AMBULATORY_CARE_PROVIDER_SITE_OTHER): Payer: Medicaid Other | Admitting: Psychiatry

## 2018-12-19 ENCOUNTER — Encounter: Payer: Self-pay | Admitting: Psychiatry

## 2018-12-19 ENCOUNTER — Other Ambulatory Visit: Payer: Self-pay

## 2018-12-19 DIAGNOSIS — F3177 Bipolar disorder, in partial remission, most recent episode mixed: Secondary | ICD-10-CM

## 2018-12-19 DIAGNOSIS — F17200 Nicotine dependence, unspecified, uncomplicated: Secondary | ICD-10-CM

## 2018-12-19 DIAGNOSIS — Z8659 Personal history of other mental and behavioral disorders: Secondary | ICD-10-CM | POA: Diagnosis not present

## 2018-12-19 DIAGNOSIS — F1421 Cocaine dependence, in remission: Secondary | ICD-10-CM | POA: Diagnosis not present

## 2018-12-19 DIAGNOSIS — F5105 Insomnia due to other mental disorder: Secondary | ICD-10-CM

## 2018-12-19 DIAGNOSIS — F410 Panic disorder [episodic paroxysmal anxiety] without agoraphobia: Secondary | ICD-10-CM
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
## 2018-12-21 ENCOUNTER — Telehealth: Payer: Self-pay

## 2018-12-21 DIAGNOSIS — E876 Hypokalemia: Secondary | ICD-10-CM

## 2018-12-21 MED ORDER — POTASSIUM CHLORIDE CRYS ER 20 MEQ PO TBCR: 20.0000 meq | EXTENDED_RELEASE_TABLET | Freq: Every day | ORAL | 3 refills | Status: DC

## 2018-12-21 NOTE — Telephone Encounter (Signed)
Patient was advised and agreed to started taking the potassium supplement. Medication send into pharmacy.

## 2018-12-21 NOTE — Telephone Encounter (Signed)
-----   Message from Virginia Crews, MD sent at 12/20/2018  7:09 PM EDT ----- Potassium remains slightly low despite changing BP medication.  We can start potassium supplement with KDur 56mEq daily ([facility] to eRx if patient agrees).  A1c is now in prediabetic range.  Recommend low carb diet and regular exercise to decrease likelihood of progression to diabetes.

## 2019-01-01 ENCOUNTER — Telehealth: Payer: Medicaid Other | Admitting: Family

## 2019-01-01 DIAGNOSIS — M549 Dorsalgia, unspecified: Secondary | ICD-10-CM

## 2019-01-01 MED ORDER — PREDNISONE 10 MG (21) PO TBPK: ORAL_TABLET | ORAL | 0 refills | Status: DC

## 2019-01-01 NOTE — Progress Notes (Signed)
We are sorry that you are not feeling well.  Here is how we plan to help!  Based on what you have shared with me it looks like you mostly have acute back pain.  Acute back pain is defined as musculoskeletal pain that can resolve in 1-3 weeks with conservative treatment.  I recommend you continue your Meloxicam 15 mg and Methocarbamol. I have sent in a prescription of prednisone dose pack.   Some patients experience stomach irritation or in increased heartburn with anti-inflammatory drugs.  Please keep in mind that muscle relaxer's can cause fatigue and should not be taken while at work or driving.  Back pain is very common.  The pain often gets better over time.  The cause of back pain is usually not dangerous.  Most people can learn to manage their back pain on their own.  Home Care  Stay active.  Start with short walks on flat ground if you can.  Try to walk farther each day.  Do not sit, drive or stand in one place for more than 30 minutes.  Do not stay in bed.  Do not avoid exercise or work.  Activity can help your back heal faster.  Be careful when you bend or lift an object.  Bend at your knees, keep the object close to you, and do not twist.  Sleep on a firm mattress.  Lie on your side, and bend your knees.  If you lie on your back, put a pillow under your knees.  Only take medicines as told by your doctor.  Put ice on the injured area.  Put ice in a plastic bag  Place a towel between your skin and the bag  Leave the ice on for 15-20 minutes, 3-4 times a day for the first 2-3 days. 210 After that, you can switch between ice and heat packs.  Ask your doctor about back exercises or massage.  Avoid feeling anxious or stressed.  Find good ways to deal with stress, such as exercise.  Get Help Right Way If:  Your pain does not go away with rest or medicine.  Your pain does not go away in 1 week.  You have new problems.  You do not feel well.  The pain spreads into your  legs.  You cannot control when you poop (bowel movement) or pee (urinate)  You feel sick to your stomach (nauseous) or throw up (vomit)  You have belly (abdominal) pain.  You feel like you may pass out (faint).  If you develop a fever.  Make Sure you:  Understand these instructions.  Will watch your condition  Will get help right away if you are not doing well or get worse.  Your e-visit answers were reviewed by a board certified advanced clinical practitioner to complete your personal care plan.  Depending on the condition, your plan could have included both over the counter or prescription medications.  If there is a problem please reply  once you have received a response from your provider.  Your safety is important to [place].  If you have drug allergies check your prescription carefully.    You can use MyChart to ask questions about today's visit, request a non-urgent call back, or ask for a work or school excuse for 24 hours related to this e-Visit. If it has been greater than 24 hours you will need to follow up with your provider, or enter a new e-Visit to address those concerns.  You will get an  e-mail in the next two days asking about your experience.  I hope that your e-visit has been valuable and will speed your recovery. Thank you for using e-visits.  Approximately 5 minutes was spent documenting and reviewing patient's chart.

## 2019-01-10 DIAGNOSIS — D3617 Benign neoplasm of peripheral nerves and autonomic nervous system of trunk, unspecified: Secondary | ICD-10-CM | POA: Diagnosis not present

## 2019-01-10 DIAGNOSIS — R21 Rash and other nonspecific skin eruption: Secondary | ICD-10-CM | POA: Diagnosis not present

## 2019-01-10 DIAGNOSIS — L720 Epidermal cyst: Secondary | ICD-10-CM | POA: Diagnosis not present

## 2019-01-10 DIAGNOSIS — D485 Neoplasm of uncertain behavior of skin: Secondary | ICD-10-CM | POA: Diagnosis not present

## 2019-01-16 ENCOUNTER — Encounter: Payer: Self-pay | Admitting: Family Medicine

## 2019-01-16 ENCOUNTER — Ambulatory Visit: Payer: Medicaid Other | Admitting: Family Medicine

## 2019-01-16 ENCOUNTER — Other Ambulatory Visit: Payer: Self-pay

## 2019-01-16 VITALS — BP 133/82 | HR 97 | Temp 96.9°F | Wt 163.0 lb

## 2019-01-16 DIAGNOSIS — I10 Essential (primary) hypertension: Secondary | ICD-10-CM | POA: Diagnosis not present

## 2019-01-16 DIAGNOSIS — E876 Hypokalemia: Secondary | ICD-10-CM | POA: Diagnosis not present

## 2019-01-16 NOTE — Patient Instructions (Signed)

## 2019-01-16 NOTE — Assessment & Plan Note (Signed)
Continue KDur Repeat RFP

## 2019-01-16 NOTE — Progress Notes (Signed)
Patient: Alexa Newman Female    DOB: Sep 04, 1960   58 y.o.   MRN: [id number] Visit Date: 01/16/2019  Today's Provider: [name], MD   Chief Complaint  Patient presents with  . Hypertension   Subjective:     HPI   Hypertension, follow-up:  BP Readings from Last 3 Encounters:  01/16/19 133/82  12/16/18 (!) 161/91  11/17/18 (!) 157/89    She was last seen for hypertension 1 months ago.  BP at that visit was 161/91. Management since that visit includes Increased Maxzide to 1 tab a day. She reports excellent compliance with treatment. She is not having side effects.  She is exercising. She is adherent to low salt diet.   Outside blood pressures are improving. She is experiencing none.  Patient denies chest pain, chest pressure/discomfort, dyspnea, fatigue, irregular heart beat and lower extremity edema.   Cardiovascular risk factors include hypertension.  Use of agents associated with hypertension: NSAIDS.     Weight trend: decreasing steadily Wt Readings from Last 3 Encounters:  01/16/19 163 lb (73.9 kg)  12/16/18 166 lb (75.3 kg)  11/17/18 168 lb 9.6 oz (76.5 kg)    Current diet: in general, a "healthy" diet    ------------------------------------------------------------------------     Allergies  Allergen Reactions  . Pregabalin     Other reaction(s): Other (See Comments) Suicidal ideation     Current Outpatient Medications:  .  amLODipine (NORVASC) 10 MG tablet, Take 1 tablet (10 mg total) by mouth daily., Disp: 90 tablet, Rfl: 1 .  doxepin (SINEQUAN) 10 MG capsule, Take 1-2 capsules (10-20 mg total) by mouth at bedtime as needed. For sleep, Disp: 60 capsule, Rfl: 2 .  lamoTRIgine (LAMICTAL) 150 MG tablet, TAKE 1 TABLET BY MOUTH EVERY DAY, Disp: 30 tablet, Rfl: 2 .  meloxicam (MOBIC) 15 MG tablet, TAKE 1 TABLET BY MOUTH EVERY DAY, Disp: 30 tablet, Rfl: 5 .  methocarbamol (ROBAXIN) 500 MG tablet, Take 500 mg by mouth 2 (two) times  daily., Disp: , Rfl:  .  potassium chloride SA (KLOR-CON) 20 MEQ tablet, Take 1 tablet (20 mEq total) by mouth daily., Disp: 30 tablet, Rfl: 3 .  triamterene-hydrochlorothiazide (MAXZIDE-25) 37.5-25 MG tablet, Take 1 tablet by mouth daily., Disp: 30 tablet, Rfl: 3 .  predniSONE (STERAPRED UNI-PAK 21 TAB) 10 MG (21) TBPK tablet, Use as directed (Patient not taking: Reported on 01/16/2019), Disp: 21 tablet, Rfl: 0  Review of Systems  Constitutional: Negative.   Respiratory: Negative.   Cardiovascular: Negative.   Gastrointestinal: Negative.   Musculoskeletal: Positive for arthralgias.  Neurological: Negative for dizziness, light-headedness and headaches.  Psychiatric/Behavioral: Negative.     Social History   Tobacco Use  . Smoking status: Current Every Day Smoker    Packs/day: 1.00    Years: 45.00    Pack years: 45.00    Types: Cigarettes  . Smokeless tobacco: Never Used  . Tobacco comment: started smoking at age 49  Substance Use Topics  . Alcohol use: Yes    Comment: 1-2 times per month      Objective:   BP 133/82 (BP Location: Right Arm, Patient Position: Sitting, Cuff Size: Large)   Pulse 97   Temp (!) 96.9 F (36.1 C) (Temporal)   Wt 163 lb (73.9 kg)   LMP 12/07/2011   BMI 27.98 kg/m  Vitals:   01/16/19 0815  BP: 133/82  Pulse: 97  Temp: (!) 96.9 F (36.1 C)  TempSrc: Temporal  Weight: 163  lb (73.9 kg)  Body mass index is 27.98 kg/m.   Physical Exam Vitals signs reviewed.  Constitutional:      General: She is not in acute distress.    Appearance: Normal appearance. She is well-developed. She is not diaphoretic.  HENT:     Head: Normocephalic and atraumatic.  Eyes:     General: No scleral icterus.    Conjunctiva/sclera: Conjunctivae normal.  Neck:     Musculoskeletal: Neck supple.     Thyroid: No thyromegaly.  Cardiovascular:     Rate and Rhythm: Normal rate and regular rhythm.     Pulses: Normal pulses.     Heart sounds: Normal heart sounds. No  murmur.  Pulmonary:     Effort: Pulmonary effort is normal. No respiratory distress.     Breath sounds: Normal breath sounds. No wheezing, rhonchi or rales.  Musculoskeletal:     Right lower leg: No edema.     Left lower leg: No edema.  Lymphadenopathy:     Cervical: No cervical adenopathy.  Skin:    General: Skin is warm and dry.     Capillary Refill: Capillary refill takes less than 2 seconds.     Findings: No rash.  Neurological:     Mental Status: She is alert and oriented to person, place, and time. Mental status is at baseline.  Psychiatric:        Mood and Affect: Mood normal.        Behavior: Behavior normal.     Assessment & Plan   Problem List Items Addressed This Visit      Cardiovascular and Mediastinum   Essential hypertension - Primary    Well controlled Continue current medications Recheck RFP F/u in 3-6 months       Relevant Orders   Renal Function Panel     Other   Hypokalemia    Continue KDur Repeat RFP      Relevant Orders   Renal Function Panel       Return in about 3 months (around 04/18/2019) for chronic disease f/u, as scheduled.   The entirety of the information documented in the History of Present Illness, Review of Systems and Physical Exam were personally obtained by me. Portions of this information were initially documented by Ashley Royalty, CMA and reviewed by me for thoroughness and accuracy.    Ilhan Debenedetto, Dionne Bucy, MD MPH [facility]

## 2019-01-16 NOTE — Assessment & Plan Note (Signed)
Well controlled Continue current medications Recheck RFP F/u in 3-6 months

## 2019-01-17 LAB — RENAL FUNCTION PANEL
Albumin: 4.3 g/dL (ref 3.8–4.9)
BUN/Creatinine Ratio: 19 (ref 9–23)
BUN: 15 mg/dL (ref 6–24)
CO2: 22 mmol/L (ref 20–29)
Calcium: 9.8 mg/dL (ref 8.7–10.2)
Chloride: 101 mmol/L (ref 96–106)
Creatinine, Ser: 0.77 mg/dL (ref 0.57–1.00)
GFR calc Af Amer: 99 mL/min/{1.73_m2} (ref >59)
GFR calc non Af Amer: 86 mL/min/{1.73_m2} (ref >59)
Glucose: 97 mg/dL (ref 65–99)
Phosphorus: 4.4 mg/dL — ABNORMAL HIGH (ref 3.0–4.3)
Potassium: 3.1 mmol/L — ABNORMAL LOW (ref 3.5–5.2)
Sodium: 140 mmol/L (ref 134–144)

## 2019-02-09 ENCOUNTER — Other Ambulatory Visit: Payer: Self-pay | Admitting: Psychiatry

## 2019-02-09 DIAGNOSIS — F3162 Bipolar disorder, current episode mixed, moderate: Secondary | ICD-10-CM

## 2019-02-27 ENCOUNTER — Encounter: Payer: Self-pay | Admitting: Psychiatry

## 2019-02-27 ENCOUNTER — Ambulatory Visit (INDEPENDENT_AMBULATORY_CARE_PROVIDER_SITE_OTHER): Payer: Medicaid Other | Admitting: Psychiatry

## 2019-02-27 ENCOUNTER — Other Ambulatory Visit: Payer: Self-pay

## 2019-02-27 DIAGNOSIS — F1421 Cocaine dependence, in remission: Secondary | ICD-10-CM

## 2019-02-27 DIAGNOSIS — Z8659 Personal history of other mental and behavioral disorders: Secondary | ICD-10-CM | POA: Diagnosis not present

## 2019-02-27 DIAGNOSIS — F410 Panic disorder [episodic paroxysmal anxiety] without agoraphobia: Secondary | ICD-10-CM

## 2019-02-27 DIAGNOSIS — F3177 Bipolar disorder, in partial remission, most recent episode mixed: Secondary | ICD-10-CM | POA: Diagnosis not present

## 2019-02-27 DIAGNOSIS — F5105 Insomnia due to other mental disorder: Secondary | ICD-10-CM | POA: Diagnosis not present

## 2019-02-27 DIAGNOSIS — F17200 Nicotine dependence, unspecified, uncomplicated: Secondary | ICD-10-CM
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
## 2019-03-01 ENCOUNTER — Ambulatory Visit: Payer: Medicaid Other | Attending: Internal Medicine

## 2019-03-01 DIAGNOSIS — Z20822 Contact with and (suspected) exposure to covid-19: Secondary | ICD-10-CM

## 2019-03-01 DIAGNOSIS — Z20828 Contact with and (suspected) exposure to other viral communicable diseases: Secondary | ICD-10-CM | POA: Diagnosis not present

## 2019-03-02 LAB — NOVEL CORONAVIRUS, NAA: SARS-CoV-2, NAA: NOT DETECTED

## 2019-03-22 ENCOUNTER — Ambulatory Visit: Payer: Medicaid Other | Attending: Internal Medicine

## 2019-03-22 DIAGNOSIS — Z20822 Contact with and (suspected) exposure to covid-19: Secondary | ICD-10-CM

## 2019-03-23 LAB — NOVEL CORONAVIRUS, NAA: SARS-CoV-2, NAA: NOT DETECTED

## 2019-04-14 ENCOUNTER — Other Ambulatory Visit: Payer: Self-pay | Admitting: Family Medicine

## 2019-04-19 ENCOUNTER — Encounter: Payer: Self-pay | Admitting: Family Medicine

## 2019-04-19 ENCOUNTER — Ambulatory Visit (INDEPENDENT_AMBULATORY_CARE_PROVIDER_SITE_OTHER): Payer: Medicaid Other | Admitting: Family Medicine

## 2019-04-19 ENCOUNTER — Other Ambulatory Visit: Payer: Self-pay

## 2019-04-19 VITALS — BP 121/77 | HR 88 | Temp 96.8°F | Wt 165.0 lb

## 2019-04-19 DIAGNOSIS — E663 Overweight: Secondary | ICD-10-CM

## 2019-04-19 DIAGNOSIS — R7303 Prediabetes: Secondary | ICD-10-CM | POA: Diagnosis not present

## 2019-04-19 DIAGNOSIS — F319 Bipolar disorder, unspecified: Secondary | ICD-10-CM

## 2019-04-19 DIAGNOSIS — I10 Essential (primary) hypertension: Secondary | ICD-10-CM

## 2019-04-19 DIAGNOSIS — E876 Hypokalemia: Secondary | ICD-10-CM | POA: Diagnosis not present

## 2019-04-19 DIAGNOSIS — F17200 Nicotine dependence, unspecified, uncomplicated: Secondary | ICD-10-CM

## 2019-04-19 DIAGNOSIS — Z1231 Encounter for screening mammogram for malignant neoplasm of breast: Secondary | ICD-10-CM

## 2019-04-19 DIAGNOSIS — M25531 Pain in right wrist: Secondary | ICD-10-CM | POA: Insufficient documentation

## 2019-04-19 DIAGNOSIS — F1421 Cocaine dependence, in remission: Secondary | ICD-10-CM

## 2019-04-19 MED ORDER — MELOXICAM 15 MG PO TABS: 15.0000 mg | ORAL_TABLET | Freq: Every day | ORAL | 5 refills | Status: DC

## 2019-04-19 MED ORDER — TRIAMTERENE-HCTZ 37.5-25 MG PO TABS: 1.0000 | ORAL_TABLET | Freq: Every day | ORAL | 1 refills | Status: DC

## 2019-04-19 MED ORDER — AMLODIPINE BESYLATE 10 MG PO TABS: 10.0000 mg | ORAL_TABLET | Freq: Every day | ORAL | 1 refills | Status: DC

## 2019-04-19 NOTE — Assessment & Plan Note (Signed)
3-5 min discussion Discussed importance of cessation and risks of continuing to smoke She remains precomtemplative

## 2019-04-19 NOTE — Progress Notes (Signed)
Patient: Alexa Newman Female    DOB: [birth year]   59 y.o.   MRN: [id number] Visit Date: 04/19/2019  Today's Provider: [name], MD   Chief Complaint  Patient presents with  . Hypertension   Subjective:     HPI    Hypertension, follow-up:  BP Readings from Last 3 Encounters:  04/19/19 121/77  01/16/19 133/82  12/16/18 (!) 161/91    She was last seen for hypertension 3 months ago.  BP at that visit was 133/82. Management since that visit includes No changes. She reports excellent compliance with treatment. She is not having side effects.  She is exercising. She is adherent to low salt diet.   Outside blood pressures are running normal at home. She is experiencing fatigue.  Patient denies chest pain, dyspnea, lower extremity edema and paroxysmal nocturnal dyspnea.   Cardiovascular risk factors include hypertension.  Use of agents associated with hypertension: none.     Weight trend: stable Wt Readings from Last 3 Encounters:  04/19/19 165 lb (74.8 kg)  01/16/19 163 lb (73.9 kg)  12/16/18 166 lb (75.3 kg)    Current diet: in general, a "healthy" diet    ------------------------------------------------------------------------    Allergies  Allergen Reactions  . Pregabalin     Other reaction(s): Other (See Comments) Suicidal ideation     Current Outpatient Medications:  .  amLODipine (NORVASC) 10 MG tablet, Take 1 tablet (10 mg total) by mouth daily., Disp: 90 tablet, Rfl: 1 .  doxepin (SINEQUAN) 10 MG capsule, Take 1-2 capsules (10-20 mg total) by mouth at bedtime as needed. For sleep, Disp: 60 capsule, Rfl: 2 .  lamoTRIgine (LAMICTAL) 150 MG tablet, TAKE 1 TABLET BY MOUTH EVERY DAY, Disp: 30 tablet, Rfl: 2 .  meloxicam (MOBIC) 15 MG tablet, TAKE 1 TABLET BY MOUTH EVERY DAY, Disp: 30 tablet, Rfl: 5 .  methocarbamol (ROBAXIN) 500 MG tablet, Take 500 mg by mouth 2 (two) times daily., Disp: , Rfl:  .  potassium chloride SA (KLOR-CON) 20 MEQ  tablet, Take 1 tablet (20 mEq total) by mouth daily., Disp: 30 tablet, Rfl: 3 .  triamterene-hydrochlorothiazide (MAXZIDE-25) 37.5-25 MG tablet, TAKE 1 TABLET BY MOUTH EVERY DAY, Disp: 30 tablet, Rfl: 0  Review of Systems  Constitutional: Negative.   Respiratory: Negative.   Cardiovascular: Negative.   Gastrointestinal: Negative.   Musculoskeletal: Negative.   Neurological: Positive for light-headedness. Negative for dizziness and headaches.    Social History   Tobacco Use  . Smoking status: Current Every Day Smoker    Packs/day: 1.00    Years: 45.00    Pack years: 45.00    Types: Cigarettes  . Smokeless tobacco: Never Used  . Tobacco comment: started smoking at age 55  Substance Use Topics  . Alcohol use: Yes    Comment: 1-2 times per month      Objective:   BP 121/77 (BP Location: Left Arm, Patient Position: Sitting, Cuff Size: Large)   Pulse 88   Temp (!) 96.8 F (36 C) (Temporal)   Wt 165 lb (74.8 kg)   LMP 12/07/2011   BMI 28.32 kg/m  Vitals:   04/19/19 1051  BP: 121/77  Pulse: 88  Temp: (!) 96.8 F (36 C)  TempSrc: Temporal  Weight: 165 lb (74.8 kg)  Body mass index is 28.32 kg/m.   Physical Exam Vitals reviewed.  Constitutional:      General: She is not in acute distress.    Appearance: Normal appearance.  She is well-developed. She is not diaphoretic.  HENT:     Head: Normocephalic and atraumatic.  Eyes:     General: No scleral icterus.    Conjunctiva/sclera: Conjunctivae normal.  Neck:     Thyroid: No thyromegaly.  Cardiovascular:     Rate and Rhythm: Normal rate and regular rhythm.     Pulses: Normal pulses.     Heart sounds: Normal heart sounds. No murmur.  Pulmonary:     Effort: Pulmonary effort is normal. No respiratory distress.     Breath sounds: Normal breath sounds. No wheezing, rhonchi or rales.  Musculoskeletal:     Cervical back: Neck supple.     Right lower leg: No edema.     Left lower leg: No edema.  Lymphadenopathy:      Cervical: No cervical adenopathy.  Skin:    General: Skin is warm and dry.     Capillary Refill: Capillary refill takes less than 2 seconds.     Findings: No rash.  Neurological:     Mental Status: She is alert and oriented to person, place, and time. Mental status is at baseline.  Psychiatric:        Mood and Affect: Mood normal.        Behavior: Behavior normal.      No results found for any visits on 04/19/19.     Assessment & Plan    Problem List Items Addressed This Visit      Cardiovascular and Mediastinum   Essential hypertension - Primary    Well controlled Continue current medications Recheck metabolic panel F/u in 6 months       Relevant Medications   amLODipine (NORVASC) 10 MG tablet   triamterene-hydrochlorothiazide (MAXZIDE-25) 37.5-25 MG tablet   Other Relevant Orders   Comprehensive metabolic panel     Other   Bipolar 1 disorder ([place])    Followed by psych Continue current meds      Tobacco use disorder    3-5 min discussion Discussed importance of cessation and risks of continuing to smoke She remains precomtemplative      Relevant Orders   Ambulatory Referral for Lung Cancer Scre   Cocaine use disorder, moderate, in sustained remission (HCC)    Sustained remission      Overweight    Discussed importance of healthy weight management Discussed diet and exercise       Hypokalemia    Has stopped taking K supplement due to nausea, but has increased dietary K intake Will recheck metabolic panel      Relevant Orders   Comprehensive metabolic panel   Prediabetes    Encouraged low carb diet Recheck A1c      Relevant Orders   Hemoglobin A1c   Right wrist pain    Ongoing problem H/o wrist surgery Wants to see Ortho at [facility]      Relevant Orders   Ambulatory referral to Orthopedic Surgery    Other Visit Diagnoses    Breast cancer screening by mammogram       Relevant Orders   MM 3D SCREEN BREAST BILATERAL       Return in about  6 months (around 10/17/2019) for CPE.   The entirety of the information documented in the History of Present Illness, Review of Systems and Physical Exam were personally obtained by me. Portions of this information were initially documented by Ashley Royalty, CMA and reviewed by me for thoroughness and accuracy.    [place] Duerst, Dionne Bucy, MD MPH [facility]  [facility]

## 2019-04-19 NOTE — Assessment & Plan Note (Signed)
Discussed importance of healthy weight management Discussed diet and exercise  

## 2019-04-19 NOTE — Assessment & Plan Note (Signed)
Encouraged low carb diet Recheck A1c 

## 2019-04-19 NOTE — Assessment & Plan Note (Signed)
Well controlled Continue current medications Recheck metabolic panel F/u in 6 months  

## 2019-04-19 NOTE — Assessment & Plan Note (Signed)
Followed by psych Continue current meds 

## 2019-04-19 NOTE — Assessment & Plan Note (Signed)
Has stopped taking K supplement due to nausea, but has increased dietary K intake Will recheck metabolic panel

## 2019-04-19 NOTE — Patient Instructions (Signed)
Low-FODMAP Eating Plan  FODMAPs (fermentable oligosaccharides, disaccharides, monosaccharides, and polyols) are sugars that are hard for some people to digest. A low-FODMAP eating plan may help some people who have bowel (intestinal) diseases to manage their symptoms. This meal plan can be complicated to follow. Work with a diet and nutrition specialist (dietitian) to make a low-FODMAP eating plan that is right for you. A dietitian can make sure that you get enough nutrition from this diet. What are tips for following this plan? Reading food labels  Check labels for hidden FODMAPs such as: ? High-fructose syrup. ? Honey. ? Agave. ? Natural fruit flavors. ? Onion or garlic powder.  Choose low-FODMAP foods that contain 3-4 grams of fiber per serving.  Check food labels for serving sizes. Eat only one serving at a time to make sure FODMAP levels stay low. Meal planning  Follow a low-FODMAP eating plan for up to 6 weeks, or as told by your health care provider or dietitian.  To follow the eating plan: 1. Eliminate high-FODMAP foods from your diet completely. 2. Gradually reintroduce high-FODMAP foods into your diet one at a time. Most people should wait a few days after introducing one high-FODMAP food before they introduce the next high-FODMAP food. Your dietitian can recommend how quickly you may reintroduce foods. 3. Keep a daily record of what you eat and drink, and make note of any symptoms that you have after eating. 4. Review your daily record with a dietitian regularly. Your dietitian can help you identify which foods you can eat and which foods you should avoid. General tips  Drink enough fluid each day to keep your urine pale yellow.  Avoid processed foods. These often have added sugar and may be high in FODMAPs.  Avoid most dairy products, whole grains, and sweeteners.  Work with a dietitian to make sure you get enough fiber in your diet. Recommended  foods Grains  Gluten-free grains, such as rice, oats, buckwheat, quinoa, corn, polenta, and millet. Gluten-free pasta, bread, or cereal. Rice noodles. Corn tortillas. Vegetables  Eggplant, zucchini, cucumber, peppers, green beans, Brussels sprouts, bean sprouts, lettuce, arugula, kale, Swiss chard, spinach, collard greens, bok choy, summer squash, potato, and tomato. Limited amounts of corn, carrot, and sweet potato. Green parts of scallions. Fruits  Bananas, oranges, lemons, limes, blueberries, raspberries, strawberries, grapes, cantaloupe, honeydew melon, kiwi, papaya, passion fruit, and pineapple. Limited amounts of dried cranberries, banana chips, and shredded coconut. Dairy  Lactose-free milk, yogurt, and kefir. Lactose-free cottage cheese and ice cream. Non-dairy milks, such as almond, coconut, hemp, and rice milk. Yogurts made of non-dairy milks. Limited amounts of goat cheese, brie, mozzarella, parmesan, swiss, and other hard cheeses. Meats and other protein foods  Unseasoned beef, pork, poultry, or fish. Eggs. Bacon. Tofu (firm) and tempeh. Limited amounts of nuts and seeds, such as almonds, walnuts, brazil nuts, pecans, peanuts, pumpkin seeds, chia seeds, and sunflower seeds. Fats and oils  Butter-free spreads. Vegetable oils, such as olive, canola, and sunflower oil. Seasoning and other foods  Artificial sweeteners with names that do not end in "ol" such as aspartame, saccharine, and stevia. Maple syrup, white table sugar, raw sugar, brown sugar, and molasses. Fresh basil, coriander, parsley, rosemary, and thyme. Beverages  Water and mineral water. Sugar-sweetened soft drinks. Small amounts of orange juice or cranberry juice. Black and green tea. Most dry wines. Coffee. This may not be a complete list of low-FODMAP foods. Talk with your dietitian for more information. Foods to avoid Grains  Wheat,   including kamut, durum, and semolina. Barley and bulgur. Couscous. Wheat-based  cereals. Wheat noodles, bread, crackers, and pastries. Vegetables  Chicory root, artichoke, asparagus, cabbage, snow peas, sugar snap peas, mushrooms, and cauliflower. Onions, garlic, leeks, and the white part of scallions. Fruits  Fresh, dried, and juiced forms of apple, pear, watermelon, peach, plum, cherries, apricots, blackberries, boysenberries, figs, nectarines, and mango. Avocado. Dairy  Milk, yogurt, ice cream, and soft cheese. Cream and sour cream. Milk-based sauces. Custard. Meats and other protein foods  Fried or fatty meat. Sausage. Cashews and pistachios. Soybeans, baked beans, black beans, chickpeas, kidney beans, fava beans, navy beans, lentils, and split peas. Seasoning and other foods  Any sugar-free gum or candy. Foods that contain artificial sweeteners such as sorbitol, mannitol, isomalt, or xylitol. Foods that contain honey, high-fructose corn syrup, or agave. Bouillon, vegetable stock, beef stock, and chicken stock. Garlic and onion powder. Condiments made with onion, such as hummus, chutney, pickles, relish, salad dressing, and salsa. Tomato paste. Beverages  Chicory-based drinks. Coffee substitutes. Chamomile tea. Fennel tea. Sweet or fortified wines such as port or sherry. Diet soft drinks made with isomalt, mannitol, maltitol, sorbitol, or xylitol. Apple, pear, and mango juice. Juices with high-fructose corn syrup. This may not be a complete list of high-FODMAP foods. Talk with your dietitian to discuss what dietary choices are best for you.  Summary  A low-FODMAP eating plan is a short-term diet that eliminates FODMAPs from your diet to help ease symptoms of certain bowel diseases.  The eating plan usually lasts up to 6 weeks. After that, high-FODMAP foods are restarted gradually, one at a time, so you can find out which may be causing symptoms.  A low-FODMAP eating plan can be complicated. It is best to work with a dietitian who has experience with this type of  plan. This information is not intended to replace advice given to you by your health care provider. Make sure you discuss any questions you have with your health care provider. Document Revised: 02/05/2017 Document Reviewed: 10/20/2016 Elsevier Patient Education  2020 Elsevier Inc.  

## 2019-04-19 NOTE — Assessment & Plan Note (Signed)
Ongoing problem H/o wrist surgery Wants to see Ortho at [facility]

## 2019-04-19 NOTE — Assessment & Plan Note (Signed)
Sustained remission 

## 2019-04-20 ENCOUNTER — Telehealth: Payer: Self-pay

## 2019-04-20 LAB — COMPREHENSIVE METABOLIC PANEL
ALT: 12 IU/L (ref 0–32)
AST: 14 IU/L (ref 0–40)
Albumin/Globulin Ratio: 2.3 — ABNORMAL HIGH (ref 1.2–2.2)
Albumin: 4.5 g/dL (ref 3.8–4.9)
Alkaline Phosphatase: 115 IU/L (ref 39–117)
BUN/Creatinine Ratio: 28 — ABNORMAL HIGH (ref 9–23)
BUN: 20 mg/dL (ref 6–24)
Bilirubin Total: 0.2 mg/dL (ref 0.0–1.2)
CO2: 22 mmol/L (ref 20–29)
Calcium: 9.6 mg/dL (ref 8.7–10.2)
Chloride: 103 mmol/L (ref 96–106)
Creatinine, Ser: 0.72 mg/dL (ref 0.57–1.00)
GFR calc Af Amer: 107 mL/min/{1.73_m2} (ref >59)
GFR calc non Af Amer: 93 mL/min/{1.73_m2} (ref >59)
Globulin, Total: 2 g/dL (ref 1.5–4.5)
Glucose: 93 mg/dL (ref 65–99)
Potassium: 3.5 mmol/L (ref 3.5–5.2)
Sodium: 142 mmol/L (ref 134–144)
Total Protein: 6.5 g/dL (ref 6.0–8.5)

## 2019-04-20 LAB — HEMOGLOBIN A1C
Est. average glucose Bld gHb Est-mCnc: 123 mg/dL
Hgb A1c MFr Bld: 5.9 % — ABNORMAL HIGH (ref 4.8–5.6)

## 2019-04-20 NOTE — Telephone Encounter (Signed)
Pt advised.   Thanks,   -Shawnna Pancake  

## 2019-04-20 NOTE — Telephone Encounter (Signed)
-----   Message from Virginia Crews, MD sent at 04/20/2019  8:28 AM EST ----- Normal labs, except A1c has increased.  Remember to work on low carb diet and regular exercise to decrease likelihood of progression to diabetes

## 2019-04-21 ENCOUNTER — Telehealth: Payer: Self-pay | Admitting: *Deleted

## 2019-04-21 DIAGNOSIS — Z87891 Personal history of nicotine dependence: Secondary | ICD-10-CM

## 2019-04-21 NOTE — Telephone Encounter (Signed)
Received referral for initial lung cancer screening scan. Contacted patient and obtained smoking history,(current, 45 pack year) as well as answering questions related to screening process. Patient denies signs of lung cancer such as weight loss or hemoptysis. Patient denies comorbidity that would prevent curative treatment if lung cancer were found. Patient is scheduled for shared decision making visit and CT scan on 05/03/19 at 10am.

## 2019-05-01 DIAGNOSIS — M25541 Pain in joints of right hand: Secondary | ICD-10-CM | POA: Diagnosis not present

## 2019-05-03 ENCOUNTER — Ambulatory Visit
Admission: RE | Admit: 2019-05-03 | Discharge: 2019-05-03 | Disposition: A | Discharge status: Home / Self Care | Payer: Medicaid Other | Source: Ambulatory Visit | Attending: Oncology | Admitting: Oncology

## 2019-05-03 ENCOUNTER — Inpatient Hospital Stay: Payer: Medicaid Other | Attending: Oncology | Admitting: Oncology

## 2019-05-03 ENCOUNTER — Other Ambulatory Visit: Payer: Self-pay

## 2019-05-03 DIAGNOSIS — Z87891 Personal history of nicotine dependence: Secondary | ICD-10-CM

## 2019-05-04 ENCOUNTER — Telehealth: Payer: Self-pay | Admitting: *Deleted

## 2019-05-04 NOTE — Telephone Encounter (Signed)
Notified patient of LDCT lung cancer screening program results with recommendation for 12 month follow up imaging. Also notified of incidental findings noted below and is encouraged to discuss further with PCP who will receive a copy of this note and/or the CT report. Patient verbalizes understanding.   IMPRESSION: Lung-RADS 2, benign appearance or behavior. Continue annual screening with low-dose chest CT without contrast in 12 months.  Emphysema (ICD10-J43.9).  

## 2019-05-08 ENCOUNTER — Other Ambulatory Visit: Payer: Self-pay

## 2019-05-08 ENCOUNTER — Encounter: Payer: Self-pay | Admitting: Psychiatry

## 2019-05-08 ENCOUNTER — Ambulatory Visit (INDEPENDENT_AMBULATORY_CARE_PROVIDER_SITE_OTHER): Payer: Medicaid Other | Admitting: Psychiatry

## 2019-05-08 ENCOUNTER — Ambulatory Visit
Admission: RE | Admit: 2019-05-08 | Discharge: 2019-05-08 | Disposition: A | Discharge status: Home / Self Care | Payer: Medicaid Other | Source: Ambulatory Visit | Attending: Family Medicine | Admitting: Family Medicine

## 2019-05-08 DIAGNOSIS — F3178 Bipolar disorder, in full remission, most recent episode mixed: Secondary | ICD-10-CM

## 2019-05-08 DIAGNOSIS — Z8659 Personal history of other mental and behavioral disorders: Secondary | ICD-10-CM | POA: Diagnosis not present

## 2019-05-08 DIAGNOSIS — Z1231 Encounter for screening mammogram for malignant neoplasm of breast: Secondary | ICD-10-CM | POA: Diagnosis not present

## 2019-05-08 DIAGNOSIS — F17200 Nicotine dependence, unspecified, uncomplicated: Secondary | ICD-10-CM

## 2019-05-08 DIAGNOSIS — F5105 Insomnia due to other mental disorder: Secondary | ICD-10-CM | POA: Diagnosis not present

## 2019-05-08 DIAGNOSIS — F410 Panic disorder [episodic paroxysmal anxiety] without agoraphobia: Secondary | ICD-10-CM

## 2019-05-08 DIAGNOSIS — F1421 Cocaine dependence, in remission: Secondary | ICD-10-CM | POA: Diagnosis not present

## 2019-05-08 MED ORDER — LAMOTRIGINE 150 MG PO TABS: 150.0000 mg | ORAL_TABLET | Freq: Every day | ORAL | 3 refills | Status: DC
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
## 2019-05-09 ENCOUNTER — Telehealth: Payer: Self-pay

## 2019-05-09 NOTE — Telephone Encounter (Signed)
-----   Message from Virginia Crews, MD sent at 05/09/2019  8:19 AM EST ----- Normal mammogram. Repeat in 1 yr

## 2019-05-09 NOTE — Telephone Encounter (Signed)
Patient advised as below.  

## 2019-05-18 ENCOUNTER — Encounter: Payer: Self-pay | Admitting: Family Medicine

## 2019-06-21 DIAGNOSIS — Z23 Encounter for immunization: Secondary | ICD-10-CM | POA: Diagnosis not present

## 2019-06-22 ENCOUNTER — Telehealth: Payer: Self-pay | Admitting: Family Medicine

## 2019-06-22 ENCOUNTER — Telehealth: Payer: Medicaid Other | Admitting: Physician Assistant

## 2019-06-22 DIAGNOSIS — M549 Dorsalgia, unspecified: Secondary | ICD-10-CM

## 2019-06-22 MED ORDER — CYCLOBENZAPRINE HCL 10 MG PO TABS: 10.0000 mg | ORAL_TABLET | Freq: Three times a day (TID) | ORAL | 0 refills | Status: DC | PRN

## 2019-06-22 MED ORDER — NAPROXEN 500 MG PO TABS: 500.0000 mg | ORAL_TABLET | Freq: Two times a day (BID) | ORAL | 0 refills | Status: DC

## 2019-06-22 NOTE — Telephone Encounter (Signed)
Patient calling requesting to speak with CMA to discuss medications. She was prescribed two medications today in e-visit and pharmacy advised her to contact PCP as there may be a potential reaction if she takes her normal medications.

## 2019-06-22 NOTE — Progress Notes (Signed)

## 2019-06-22 NOTE — Telephone Encounter (Signed)
Please review.    Thanks,   -Alexa Newman  

## 2019-06-23 ENCOUNTER — Other Ambulatory Visit: Payer: Self-pay

## 2019-06-23 ENCOUNTER — Ambulatory Visit (INDEPENDENT_AMBULATORY_CARE_PROVIDER_SITE_OTHER): Payer: Medicaid Other | Admitting: Family Medicine

## 2019-06-23 ENCOUNTER — Encounter: Payer: Self-pay | Admitting: Family Medicine

## 2019-06-23 VITALS — BP 130/84 | HR 92 | Temp 97.1°F | Resp 16 | Ht 64.0 in | Wt 163.0 lb

## 2019-06-23 DIAGNOSIS — S43421A Sprain of right rotator cuff capsule, initial encounter: Secondary | ICD-10-CM

## 2019-06-23 MED ORDER — PREDNISONE 10 MG (21) PO TBPK: ORAL_TABLET | ORAL | 0 refills | Status: DC

## 2019-06-23 NOTE — Telephone Encounter (Signed)
Looks like she was prescribed Flexeril and naproxen when she already has Robaxin and Mobic on her medication list.  Robaxin and Flexeril are pretty much the same thing and naproxen and Mobic works similarly as well.  She should not be taking either of those combinations together.  She can take 1 of Robaxin or Flexeril and 1 of naproxen or Mobic, not both.

## 2019-06-23 NOTE — Telephone Encounter (Signed)
Patient requesting prednisone, will come in today at 11:20 am.

## 2019-06-23 NOTE — Progress Notes (Signed)
Established patient visit     Patient: Alexa Newman   DOB: 05/01/1960   59 y.o. Female  MRN: [id number] Visit Date: 06/23/2019  Today's healthcare provider: [name], MD  Subjective:    Chief Complaint  Patient presents with  . Shoulder Pain   Shoulder Pain  The pain is present in the right shoulder. This is a new problem. The current episode started in the past 7 days. There has been a history of trauma. The problem occurs constantly. The problem has been gradually worsening. The quality of the pain is described as sharp. The pain is at a severity of 10/10. The pain is severe. Associated symptoms include an inability to bear weight, a limited range of motion, numbness (fingers) and tingling (fingers). Pertinent negatives include no fever, itching or stiffness. The symptoms are aggravated by activity. She has tried cold and heat for the symptoms. The treatment provided mild relief.   Occurred with pulling on something.  No weakness.  Social History   Tobacco Use  . Smoking status: Current Every Day Smoker    Packs/day: 1.00    Years: 45.00    Pack years: 45.00    Types: Cigarettes  . Smokeless tobacco: Never Used  . Tobacco comment: started smoking at age 39  Substance Use Topics  . Alcohol use: Yes    Comment: 1-2 times per month  . Drug use: Not Currently    Comment: former crack cocaine user       Medications: Outpatient Medications Prior to Visit  Medication Sig  . amLODipine (NORVASC) 10 MG tablet Take 1 tablet (10 mg total) by mouth daily.  Marland Kitchen doxepin (SINEQUAN) 10 MG capsule Take 1-2 capsules (10-20 mg total) by mouth at bedtime as needed. For sleep  . lamoTRIgine (LAMICTAL) 150 MG tablet Take 1 tablet (150 mg total) by mouth daily.  . meloxicam (MOBIC) 15 MG tablet Take 1 tablet (15 mg total) by mouth daily.  . methocarbamol (ROBAXIN) 500 MG tablet Take 500 mg by mouth 2 (two) times daily.  Marland Kitchen triamterene-hydrochlorothiazide (MAXZIDE-25) 37.5-25 MG  tablet Take 1 tablet by mouth daily.  . cyclobenzaprine (FLEXERIL) 10 MG tablet Take 1 tablet (10 mg total) by mouth 3 (three) times daily as needed for muscle spasms. (Patient not taking: Reported on 06/23/2019)  . hydrOXYzine (ATARAX/VISTARIL) 25 MG tablet Take by mouth.  . naproxen (NAPROSYN) 500 MG tablet Take 1 tablet (500 mg total) by mouth 2 (two) times daily with a meal. (Patient not taking: Reported on 06/23/2019)   No facility-administered medications prior to visit.    Review of Systems  Constitutional: Negative for fever.  Cardiovascular: Negative.   Musculoskeletal: Positive for myalgias (right shoulder pain). Negative for stiffness.  Skin: Negative for itching.  Neurological: Positive for tingling (fingers) and numbness (fingers).        Objective:    BP 130/84 (BP Location: Left Arm, Patient Position: Sitting, Cuff Size: Normal)   Pulse 92   Temp (!) 97.1 F (36.2 C) (Temporal)   Resp 16   Ht 5\' 4"  (1.626 m)   Wt 163 lb (73.9 kg)   LMP 12/07/2011   BMI 27.98 kg/m    Physical Exam Vitals reviewed.  Constitutional:      General: She is not in acute distress.    Appearance: She is well-developed.  Eyes:     General: No scleral icterus.    Conjunctiva/sclera: Conjunctivae normal.  Cardiovascular:     Rate and Rhythm: Normal  rate and regular rhythm.  Pulmonary:     Effort: Pulmonary effort is normal. No respiratory distress.  Musculoskeletal:     Right shoulder: Tenderness present. Decreased range of motion. Normal strength.     Comments: R Shoulder: Inspection reveals no abnormalities, atrophy or asymmetry. Minimal TTP over coracoid process ROM is full in all planes. Rotator cuff strength normal throughout, but does report pain with resisted flexion and internal rotation No signs of impingement with negative Neer and Hawkin's tests, empty can.  Skin:    General: Skin is warm and dry.     Findings: No rash.  Neurological:     Mental Status: She is alert  and oriented to person, place, and time. Mental status is at baseline.     Sensory: No sensory deficit.     Motor: No weakness.  Psychiatric:        Behavior: Behavior normal.    No results found for any visits on 06/23/19.    Assessment & Plan:    1. Right Rotator cuff sprain Concern for rotator cuff sprain No evidence of tear as strength is intact No imaging indicated at this time, but if not improving, may need to proceed to MRI Start prednisone as below. Follow up as needed Shoulder exercise printed Avoid NSAIDs while taking prednisone, but may resume Mobic afterward She was prescribed Flexeril and naproxen during an ED visit yesterday, but we discussed not taking these as she is already taking Robaxin and meloxicam - predniSONE (STERAPRED UNI-PAK 21 TAB) 10 MG (21) TBPK tablet; Use as instructed  Dispense: 21 tablet; Refill: 0   Return for as scheduled.     I, [name], MD, have reviewed all documentation for this visit. The documentation on 06/23/19 for the exam, diagnosis, procedures, and orders are all accurate and complete.   Garlen Reinig, Dionne Bucy, MD, MPH [facility]

## 2019-06-26 ENCOUNTER — Ambulatory Visit: Payer: Self-pay | Admitting: Family Medicine

## 2019-06-30 ENCOUNTER — Other Ambulatory Visit: Payer: Self-pay | Admitting: Family Medicine

## 2019-06-30 DIAGNOSIS — S43421A Sprain of right rotator cuff capsule, initial encounter: Secondary | ICD-10-CM

## 2019-06-30 NOTE — Telephone Encounter (Signed)
Copied from [place] [id number]. Topic: Quick Communication - Rx Refill/Question >> Jun 30, 2019  2:43 PM Mcneil, Ja-Kwan wrote: Medication: predniSONE (STERAPRED UNI-PAK 21 TAB) 10 MG (21) TBPK tablet  Has the patient contacted their pharmacy? no  Preferred Pharmacy (with phone number or street name): [id number] - [place], [place]  Phone: [phone]  Fax: [phone]  Agent: Please be advised that RX refills may take up to 3 business days. We ask that you follow-up with your pharmacy.

## 2019-06-30 NOTE — Telephone Encounter (Signed)
Requested medication (s) are due for refill today:  yes  Requested medication (s) are on the active medication list: yes  Last refill:  06/23/19  Future visit scheduled: yes  Notes to clinic:  not delegated    Requested Prescriptions  Pending Prescriptions Disp Refills   predniSONE (STERAPRED UNI-PAK 21 TAB) 10 MG (21) TBPK tablet 21 tablet 0    Sig: Use as instructed      Not Delegated - Endocrinology:  Oral Corticosteroids Failed - 06/30/2019  2:47 PM      Failed - This refill cannot be delegated      Passed - Last BP in normal range    BP Readings from Last 1 Encounters:  06/23/19 130/84          Passed - Valid encounter within last 6 months    Recent Outpatient Visits           1 week ago Sprain of right rotator cuff capsule, initial encounter   [facility] [place], Dionne Bucy, MD   2 months ago Essential hypertension   [facility] [place], Dionne Bucy, MD   5 months ago Essential hypertension   [place], Dionne Bucy, MD   6 months ago Essential hypertension   [place], Dionne Bucy, MD   7 months ago Essential hypertension   [facility], Dionne Bucy, MD

## 2019-07-02 ENCOUNTER — Encounter: Payer: Self-pay | Admitting: Family Medicine

## 2019-07-03 NOTE — Telephone Encounter (Signed)
LMTCB

## 2019-07-03 NOTE — Telephone Encounter (Signed)
We do not refill prednisone tapers.  One time treatment.  May need re-evaluation if not doing better.

## 2019-07-12 DIAGNOSIS — Z23 Encounter for immunization: Secondary | ICD-10-CM | POA: Diagnosis not present

## 2019-07-17 ENCOUNTER — Ambulatory Visit: Payer: Medicaid Other | Admitting: Family Medicine

## 2019-07-17 ENCOUNTER — Other Ambulatory Visit: Payer: Self-pay

## 2019-07-17 ENCOUNTER — Encounter: Payer: Self-pay | Admitting: Family Medicine

## 2019-07-17 VITALS — BP 133/81 | HR 98 | Temp 96.9°F | Resp 16 | Ht 64.0 in | Wt 162.8 lb

## 2019-07-17 DIAGNOSIS — M7541 Impingement syndrome of right shoulder: Secondary | ICD-10-CM

## 2019-07-17 MED ORDER — METHYLPREDNISOLONE ACETATE 40 MG/ML IJ SUSP
40.0000 mg | Freq: Once | INTRAMUSCULAR | Status: AC
  Administered 2019-07-17: 40 mg via INTRAMUSCULAR

## 2019-07-17 NOTE — Progress Notes (Signed)
Established patient visit   Patient: Alexa Newman   DOB: [birth year]   59 y.o. Female  MRN: [id number] Visit Date: 07/17/2019  Today's healthcare provider: [name], MD   Chief Complaint  Patient presents with  . Shoulder Pain   Subjective    HPI  Follow up for Right Shoulder Pain  The patient was last seen for this 1 months ago. Changes made at last visit include Prednisone and shoulder exercises  She reports excellent compliance with treatment. 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 Smoking status: Current Every Day Smoker    Packs/day: 1.00    Years: 45.00    Pack years: 45.00    Types: Cigarettes  . Smokeless tobacco: Never Used  . Tobacco comment: started smoking at age 24  Substance Use Topics  . Alcohol use: Yes    Comment: 1-2 times per month  . Drug use: Not Currently    Comment: former crack cocaine user   Social History   Socioeconomic History  . Marital status: Divorced    Spouse name: Not on file  . Number of children: 1  . Years of education: Not on file  . Highest education level: Some college, no degree  Occupational History  . Occupation: disabled    Comment: on SSI awaiting decision about disability  Tobacco Use  .  Smoking status: Current Every Day Smoker    Packs/day: 1.00    Years: 45.00    Pack years: 45.00    Types: Cigarettes  . Smokeless tobacco: Never Used  . Tobacco comment: started smoking at age 52  Substance and Sexual Activity  . Alcohol use: Yes    Comment: 1-2 times per month  . Drug use: Not Currently    Comment: former crack cocaine user  . Sexual activity: Yes    Partners: Male    Birth control/protection: Post-menopausal, Surgical  Other Topics Concern  . Not on file  Social History Narrative  . Not on file   Social Determinants of Health   Financial Resource Strain:   . Difficulty of Paying Living Expenses:   Food Insecurity:   . Worried About Charity fundraiser in the Last Year:   . Arboriculturist in the Last Year:   Transportation Needs:   . Film/video editor (Medical):   Marland Kitchen Lack of Transportation (Non-Medical):   Physical Activity:   . Days of Exercise per Week:   . Minutes of Exercise per Session:   Stress:   . Feeling of Stress :   Social Connections:   . Frequency of Communication with Friends and Family:   . Frequency of Social Gatherings with Friends and Family:   . Attends Religious Services:   . Active Member of  Clubs or Organizations:   . Attends Archivist Meetings:   Marland Kitchen Marital Status:   Intimate Partner Violence:   . Fear of Current or Ex-Partner:   . Emotionally Abused:   Marland Kitchen Physically Abused:   . Sexually Abused:        Medications: Outpatient Medications Prior to Visit  Medication Sig  . amLODipine (NORVASC) 10 MG tablet Take 1 tablet (10 mg total) by mouth daily.  Marland Kitchen doxepin (SINEQUAN) 10 MG capsule Take 1-2 capsules (10-20 mg total) by mouth at bedtime as needed. For sleep  . lamoTRIgine (LAMICTAL) 150 MG tablet Take 1 tablet (150 mg total) by mouth daily.  . meloxicam (MOBIC) 15 MG tablet Take 1 tablet (15 mg total) by mouth daily.  . methocarbamol (ROBAXIN) 500 MG tablet Take 500 mg by mouth 2 (two) times daily.  Marland Kitchen  triamterene-hydrochlorothiazide (MAXZIDE-25) 37.5-25 MG tablet Take 1 tablet by mouth daily.  . [DISCONTINUED] hydrOXYzine (ATARAX/VISTARIL) 25 MG tablet Take by mouth.  . [DISCONTINUED] predniSONE (STERAPRED UNI-PAK 21 TAB) 10 MG (21) TBPK tablet Use as instructed   No facility-administered medications prior to visit.    Review of Systems  Constitutional: Negative.   HENT: Negative.   Eyes: Negative.   Respiratory: Negative.   Cardiovascular: Negative.   Gastrointestinal: Negative.   Endocrine: Negative.   Genitourinary: Negative.   Musculoskeletal: Positive for arthralgias and back pain. Negative for gait problem, joint swelling, myalgias, neck pain and neck stiffness.  Skin: Negative.   Allergic/Immunologic: Negative.   Neurological: Negative.   Hematological: Negative.   Psychiatric/Behavioral: Negative.        Objective    BP 133/81 (BP Location: Left Arm, Patient Position: Sitting, Cuff Size: Normal)   Pulse 98   Temp (!) 96.9 F (36.1 C) (Temporal)   Resp 16   Ht 5\' 4"  (1.626 m)   Wt 162 lb 12.8 oz (73.8 kg)   LMP 12/07/2011   BMI 27.94 kg/m  BP Readings from Last 3 Encounters:  07/17/19 133/81  06/23/19 130/84  04/19/19 121/77   Wt Readings from Last 3 Encounters:  07/17/19 162 lb 12.8 oz (73.8 kg)  06/23/19 163 lb (73.9 kg)  05/03/19 165 lb (74.8 kg)      Physical Exam Constitutional:      Appearance: Normal appearance.  HENT:     Head: Normocephalic and atraumatic.  Cardiovascular:     Rate and Rhythm: Normal rate and regular rhythm.     Pulses: Normal pulses.     Heart sounds: Normal heart sounds.  Pulmonary:     Effort: Pulmonary effort is normal.     Breath sounds: Normal breath sounds.  Musculoskeletal:     Right shoulder: Tenderness (upper trapezious and coricoid areas) present. No swelling, deformity, effusion, laceration, bony tenderness or crepitus. Normal range of motion. Normal strength. Normal pulse.     Left shoulder: No swelling,  deformity, effusion, laceration, tenderness, bony tenderness or crepitus. Normal range of motion. Normal strength. Normal pulse.     Right upper arm: No swelling, edema, deformity, lacerations, tenderness or bony tenderness.     Left upper arm: No swelling, edema, deformity, lacerations, tenderness or bony tenderness.     Cervical back: Normal range of motion and neck supple. No edema, erythema, signs of trauma or rigidity. No pain with movement, spinous process tenderness or muscular tenderness. Normal range of motion.     Comments: Rotator Cuff testing was normal. Neer's test: Positive Hawkin's test: Positive Shoulder stability testing was  normal Superior labrum testing was normal  Lymphadenopathy:     Cervical: No cervical adenopathy.  Skin:    General: Skin is warm and dry.  Neurological:     General: No focal deficit present.     Mental Status: She is alert and oriented to person, place, and time.     Motor: No weakness.     Deep Tendon Reflexes: Reflexes normal.  Psychiatric:        Mood and Affect: Mood normal.        Behavior: Behavior normal.      No results found for any visits on 07/17/19.  Assessment & Plan     1. Shoulder impingement, right Pt with 1 month history of shoulder pain Exam shows ROM and strength grossly intact Symptoms not likely due to rotator cuff tear or sprain Exam is positive for Hawkin's and Neer's test Symptoms most likely due to right shoulder impingement syndrome Pt with associated symptoms of numbness in right hand digits which may be due to hx of carpel tunnel, however, pt has already had carpel tunnel release surgery Shoulder pain and hand numbness raise suspicion of thoracic outlet syndrome, however, less likely given pt does not have associated arm pain, swelling, neurologic deficits or thenar and hypothenar atrophy.   Plan: RIGHT SHOULDER INJECTION: Patient was given informed consent,. Appropriate time out was taken. Area prepped and  draped in usual sterile fashion. 2 cc of depo-medrol 40 mg/ml plus  4 cc of 1% lidocaine without epinephrine was injected into the subacromial space using a(n) posterior approach. The patient tolerated the procedure well. There were no complications. Post procedure instructions were given.  -Will consider referral to orthopaedic surgery if symptoms continue to persist      Return if symptoms worsen or fail to improve.      Chipper Herb, Medical Student [facility] [phone] (phone) [phone] (fax)  [place]

## 2019-08-09 ENCOUNTER — Encounter: Payer: Self-pay | Admitting: Psychiatry

## 2019-08-09 ENCOUNTER — Other Ambulatory Visit: Payer: Self-pay

## 2019-08-09 ENCOUNTER — Telehealth (INDEPENDENT_AMBULATORY_CARE_PROVIDER_SITE_OTHER): Payer: Medicaid Other | Admitting: Psychiatry

## 2019-08-09 DIAGNOSIS — F5105 Insomnia due to other mental disorder: Secondary | ICD-10-CM | POA: Diagnosis not present

## 2019-08-09 DIAGNOSIS — F3178 Bipolar disorder, in full remission, most recent episode mixed: Secondary | ICD-10-CM | POA: Diagnosis not present

## 2019-08-09 DIAGNOSIS — Z8659 Personal history of other mental and behavioral disorders: Secondary | ICD-10-CM | POA: Diagnosis not present

## 2019-08-09 DIAGNOSIS — F17200 Nicotine dependence, unspecified, uncomplicated: Secondary | ICD-10-CM

## 2019-08-09 DIAGNOSIS — F1421 Cocaine dependence, in remission: Secondary | ICD-10-CM

## 2019-08-09 DIAGNOSIS — F410 Panic disorder [episodic paroxysmal anxiety] without agoraphobia: Secondary | ICD-10-CM
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
## 2019-09-18 ENCOUNTER — Other Ambulatory Visit: Payer: Self-pay | Admitting: Psychiatry

## 2019-09-18 DIAGNOSIS — F3178 Bipolar disorder, in full remission, most recent episode mixed: Secondary | ICD-10-CM

## 2019-09-18 NOTE — Telephone Encounter (Signed)
pt called states she needs a refill on her lamictal

## 2019-09-29 ENCOUNTER — Other Ambulatory Visit: Payer: Self-pay | Admitting: Family Medicine

## 2019-09-29 DIAGNOSIS — E876 Hypokalemia: Secondary | ICD-10-CM

## 2019-10-16 ENCOUNTER — Encounter: Payer: Medicaid Other | Admitting: Family Medicine

## 2019-11-01 ENCOUNTER — Other Ambulatory Visit: Payer: Self-pay | Admitting: Family Medicine

## 2019-11-14 ENCOUNTER — Other Ambulatory Visit: Payer: Self-pay

## 2019-11-14 ENCOUNTER — Encounter: Payer: Self-pay | Admitting: Psychiatry

## 2019-11-14 ENCOUNTER — Telehealth (INDEPENDENT_AMBULATORY_CARE_PROVIDER_SITE_OTHER): Payer: Medicaid Other | Admitting: Psychiatry

## 2019-11-14 DIAGNOSIS — Z8659 Personal history of other mental and behavioral disorders: Secondary | ICD-10-CM | POA: Diagnosis not present

## 2019-11-14 DIAGNOSIS — F5105 Insomnia due to other mental disorder: Secondary | ICD-10-CM | POA: Diagnosis not present

## 2019-11-14 DIAGNOSIS — F3178 Bipolar disorder, in full remission, most recent episode mixed: Secondary | ICD-10-CM | POA: Diagnosis not present

## 2019-11-14 DIAGNOSIS — F17200 Nicotine dependence, unspecified, uncomplicated: Secondary | ICD-10-CM

## 2019-11-14 DIAGNOSIS — F1421 Cocaine dependence, in remission: Secondary | ICD-10-CM | POA: Diagnosis not present
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
## 2019-11-27 ENCOUNTER — Ambulatory Visit: Payer: Medicaid Other | Admitting: Family Medicine

## 2019-11-27 ENCOUNTER — Encounter: Payer: Self-pay | Admitting: Family Medicine

## 2019-11-27 VITALS — BP 129/80 | HR 92 | Temp 98.3°F | Ht 64.0 in | Wt 166.0 lb

## 2019-11-27 DIAGNOSIS — Z23 Encounter for immunization: Secondary | ICD-10-CM | POA: Diagnosis not present

## 2019-11-27 DIAGNOSIS — I10 Essential (primary) hypertension: Secondary | ICD-10-CM

## 2019-11-27 DIAGNOSIS — F17200 Nicotine dependence, unspecified, uncomplicated: Secondary | ICD-10-CM | POA: Diagnosis not present

## 2019-11-27 DIAGNOSIS — M25561 Pain in right knee: Secondary | ICD-10-CM | POA: Diagnosis not present

## 2019-11-27 DIAGNOSIS — R5383 Other fatigue: Secondary | ICD-10-CM | POA: Diagnosis not present

## 2019-11-27 DIAGNOSIS — R7303 Prediabetes: Secondary | ICD-10-CM | POA: Diagnosis not present

## 2019-11-27 DIAGNOSIS — Z0000 Encounter for general adult medical examination without abnormal findings: Secondary | ICD-10-CM

## 2019-11-27 DIAGNOSIS — G8929 Other chronic pain: Secondary | ICD-10-CM | POA: Diagnosis not present

## 2019-11-27 DIAGNOSIS — E663 Overweight: Secondary | ICD-10-CM | POA: Diagnosis not present

## 2019-11-27 DIAGNOSIS — Z79899 Other long term (current) drug therapy: Secondary | ICD-10-CM

## 2019-11-27 NOTE — Progress Notes (Signed)
I,[name],acting as a scribe for [name], MD.,have documented all relevant documentation on the behalf of [name], MD,as directed by  [name], MD while in the presence of [name], MD.   Complete physical exam   Patient: Alexa Newman   DOB: March 15, 1960   59 y.o. Female  MRN: [id number] Visit Date: 11/27/2019  Today's healthcare provider: [name], MD   Chief Complaint  Patient presents with  . Annual Exam   Subjective    Alexa Newman is a 59 y.o. female who presents today for a complete physical exam.  She reports consuming a general diet.  She generally feels well. She reports sleeping well. She does have additional problems to discuss today.  Knee Pain  There was no injury mechanism. The pain is present in the right knee. Pertinent negatives include no numbness.      Past Medical History:  Diagnosis Date  . Anxiety   . Arthritis   . Bipolar 1 disorder ([place])   . Depression   . Osteoarthritis    Past Surgical History:  Procedure Laterality Date  . ANTERIOR CRUCIATE LIGAMENT (ACL) REVISION Right   . CARPAL TUNNEL RELEASE    . COLONOSCOPY    . COLONOSCOPY WITH PROPOFOL N/A 11/17/2017   Procedure: COLONOSCOPY WITH PROPOFOL;  Surgeon: Virgel Manifold, MD;  Location: [facility] ENDOSCOPY;  Service: Endoscopy;  Laterality: N/A;  . DILATION AND CURETTAGE OF UTERUS  2013   for heavy bleeding  . ENDOMETRIAL ABLATION  2013  . TUBAL LIGATION    . WRIST ARTHROSCOPY Left 1990   for degenerative changes   Social History   Socioeconomic History  . Marital status: Divorced    Spouse name: Not on file  . Number of children: 1  . Years of education: Not on file  . Highest education level: Some college, no degree  Occupational History  . Occupation: disabled    Comment: on SSI awaiting decision about disability  Tobacco Use  . Smoking status: Current Every Day Smoker    Packs/day: 0.50    Years: 45.00    Pack  years: 22.50    Types: Cigarettes  . Smokeless tobacco: Never Used  . Tobacco comment: started smoking at age 54  Vaping Use  . Vaping Use: Former  Substance and Sexual Activity  . Alcohol use: Yes    Comment: 1-2 times per month  . Drug use: Not Currently    Comment: former crack cocaine user  . Sexual activity: Yes    Partners: Male    Birth control/protection: Post-menopausal, Surgical  Other Topics Concern  . Not on file  Social History Narrative  . Not on file   Social Determinants of Health   Financial Resource Strain:   . Difficulty of Paying Living Expenses: Not on file  Food Insecurity:   . Worried About Charity fundraiser in the Last Year: Not on file  . Ran Out of Food in the Last Year: Not on file  Transportation Needs:   . Lack of Transportation (Medical): Not on file  . Lack of Transportation (Non-Medical): Not on file  Physical Activity:   . Days of Exercise per Week: Not on file  . Minutes of Exercise per Session: Not on file  Stress:   . Feeling of Stress : Not on file  Social Connections:   . Frequency of Communication with Friends and Family: Not on file  . Frequency of Social Gatherings with Friends  and Family: Not on file  . Attends Religious Services: Not on file  . Active Member of Clubs or Organizations: Not on file  . Attends Archivist Meetings: Not on file  . Marital Status: Not on file  Intimate Partner Violence:   . Fear of Current or Ex-Partner: Not on file  . Emotionally Abused: Not on file  . Physically Abused: Not on file  . Sexually Abused: Not on file   Family Status  Relation Name Status  . Mother  Deceased  . Father  Deceased  . Sister  Alive  . Brother  Alive  . Sister  Alive  . Sister  Alive  . Sister  Alive  . Brother  Alive  . MGF  (Not Specified)  . Neg Hx  (Not Specified)   Family History  Problem Relation Age of Onset  . Diabetes Mother   . Depression Mother   . Hypertension Mother   . Crohn's  disease Mother   . Lung cancer Mother 6       former smoker  . Congestive Heart Failure Father   . Breast cancer Sister 46  . Alcohol abuse Sister   . Drug abuse Sister   . Anxiety disorder Sister   . Depression Sister   . Bipolar disorder Sister   . Alcohol abuse Brother   . Drug abuse Brother   . Anxiety disorder Brother   . Depression Brother   . Colon polyps Brother   . HIV Brother   . Alcohol abuse Brother   . Drug abuse Brother   . Anxiety disorder Brother   . Depression Brother   . Colon polyps Brother   . HIV Brother   . Skin cancer Maternal Grandfather   . Colon cancer Neg Hx   . Ovarian cancer Neg Hx   . Cervical cancer Neg Hx    Allergies  Allergen Reactions  . Pregabalin Other (See Comments)    Other reaction(s): Other (See Comments) Suicidal ideation Other reaction(s): Other (See Comments) Suicidal ideation    Patient Care Team: Virginia Crews, MD as PCP - General (Family Medicine)   Medications: Outpatient Medications Prior to Visit  Medication Sig  . amLODipine (NORVASC) 10 MG tablet TAKE 1 TABLET BY MOUTH EVERY DAY  . doxepin (SINEQUAN) 10 MG capsule Take 1-2 capsules (10-20 mg total) by mouth at bedtime as needed. For sleep  . KLOR-CON M20 20 MEQ tablet Take 20 mEq by mouth daily.  Marland Kitchen lamoTRIgine (LAMICTAL) 150 MG tablet TAKE 1 TABLET BY MOUTH EVERY DAY  . meloxicam (MOBIC) 15 MG tablet TAKE 1 TABLET BY MOUTH EVERY DAY  . methocarbamol (ROBAXIN) 500 MG tablet Take 500 mg by mouth 2 (two) times daily.  Marland Kitchen triamterene-hydrochlorothiazide (MAXZIDE-25) 37.5-25 MG tablet TAKE 1 TABLET BY MOUTH EVERY DAY   No facility-administered medications prior to visit.    Review of Systems  Constitutional: Positive for activity change. Negative for appetite change, chills, diaphoresis, fatigue, fever and unexpected weight change.  HENT: Negative.   Eyes: Negative.   Respiratory: Positive for shortness of breath. Negative for apnea, cough, choking, chest  tightness, wheezing and stridor.   Cardiovascular: Negative.   Gastrointestinal: Negative.   Endocrine: Negative.   Genitourinary: Negative.   Musculoskeletal: Positive for arthralgias, back pain, gait problem and joint swelling. Negative for myalgias, neck pain and neck stiffness.  Skin: Negative.   Allergic/Immunologic: Negative.   Neurological: Negative for dizziness, tremors, seizures, syncope, facial asymmetry, speech difficulty, weakness,  light-headedness, numbness and headaches.  Hematological: Negative.   Psychiatric/Behavioral: Positive for decreased concentration and hallucinations.      Objective    BP 129/80 (BP Location: Left Arm, Patient Position: Sitting, Cuff Size: Large)   Pulse 92   Temp 98.3 F (36.8 C) (Oral)   Ht 5\' 4"  (1.626 m)   Wt 166 lb (75.3 kg)   LMP 12/07/2011   SpO2 98%   BMI 28.49 kg/m    Physical Exam Vitals reviewed.  Constitutional:      General: She is not in acute distress.    Appearance: Normal appearance. She is well-developed. She is not diaphoretic.  HENT:     Head: Normocephalic and atraumatic.     Right Ear: Tympanic membrane, ear canal and external ear normal.     Left Ear: Tympanic membrane, ear canal and external ear normal.  Eyes:     General: No scleral icterus.    Conjunctiva/sclera: Conjunctivae normal.     Pupils: Pupils are equal, round, and reactive to light.  Neck:     Thyroid: No thyromegaly.  Cardiovascular:     Rate and Rhythm: Normal rate and regular rhythm.     Pulses: Normal pulses.     Heart sounds: Normal heart sounds. No murmur heard.   Pulmonary:     Effort: Pulmonary effort is normal. No respiratory distress.     Breath sounds: Normal breath sounds. No wheezing or rales.  Abdominal:     General: There is no distension.     Palpations: Abdomen is soft.     Tenderness: There is no abdominal tenderness.  Musculoskeletal:        General: No deformity.     Cervical back: Neck supple.     Right knee:  Swelling present. No erythema, ecchymosis, lacerations or bony tenderness. Normal range of motion. No tenderness. No LCL laxity, MCL laxity, ACL laxity or PCL laxity. Normal patellar mobility.     Right lower leg: No edema.     Left lower leg: No edema.  Lymphadenopathy:     Cervical: No cervical adenopathy.  Skin:    General: Skin is warm and dry.     Findings: No rash.  Neurological:     Mental Status: She is alert and oriented to person, place, and time. Mental status is at baseline.     Sensory: No sensory deficit.     Motor: No weakness.     Gait: Gait normal.  Psychiatric:        Mood and Affect: Mood normal.        Behavior: Behavior normal.        Thought Content: Thought content normal.      Last depression screening scores PHQ 2/9 Scores 11/27/2019 10/17/2018 07/13/2017  PHQ - 2 Score 1 0 2  PHQ- 9 Score 3 3 11    Last fall risk screening Fall Risk  11/27/2019  Falls in the past year? 0  Number falls in past yr: 0  Injury with Fall? 0  Risk for fall due to : No Fall Risks  Follow up Falls evaluation completed   Last Audit-C alcohol use screening Alcohol Use Disorder Test (AUDIT) 11/27/2019  1. How often do you have a drink containing alcohol? 1  2. How many drinks containing alcohol do you have on a typical day when you are drinking? 0  3. How often do you have six or more drinks on one occasion? 0  AUDIT-C Score 1  Alcohol Brief Interventions/Follow-up  AUDIT Score <7 follow-up not indicated   A score of 3 or more in women, and 4 or more in men indicates increased risk for alcohol abuse, EXCEPT if all of the points are from question 1   No results found for any visits on 11/27/19.  Assessment & Plan    Routine Health Maintenance and Physical Exam  Exercise Activities and Dietary recommendations Goals   None     Immunization History  Administered Date(s) Administered  . Influenza,inj,Quad PF,6+ Mos 01/13/2018, 11/17/2018, 11/27/2019  . Moderna SARS-COVID-2  Vaccination 05/31/2019, 06/28/2019  . Tdap 09/20/2009, 11/27/2019    Health Maintenance  Topic Date Due  . MAMMOGRAM  05/07/2021  . PAP SMEAR-Modifier  10/14/2022  . COLONOSCOPY  11/18/2022  . TETANUS/TDAP  11/26/2029  . INFLUENZA VACCINE  Completed  . COVID-19 Vaccine  Completed  . Hepatitis C Screening  Completed  . HIV Screening  Completed    Discussed health benefits of physical activity, and encouraged her to engage in regular exercise appropriate for her age and condition.  Problem List Items Addressed This Visit      Cardiovascular and Mediastinum   Essential hypertension    Well controlled Continue current medications Recheck metabolic panel F/u in 6 months       Relevant Orders   Comprehensive metabolic panel   Lipid panel     Other   Tobacco use disorder    3-5 minute discussion regarding the harms of tobacco use, the benefits of cessation, and methods of cessation Discussed that there are medication options to help with cessation Patient is currently precontemplative  Will reassess at next visit       Overweight    Discussed importance of healthy weight management Discussed diet and exercise       Prediabetes    Recommend low carb diet Recheck A1c      Relevant Orders   Comprehensive metabolic panel   Lipid panel   Hemoglobin A1c   Chronic pain of right knee    Long standing issue related to OA Discussed conservative management - including light exercise Can use NSAIDs prn Has done well with corticosteroids in the past and agrees to joint injection today - see procedure note      Fatigue    Worsening recently wil lcheck labs for possible underlying etiologies      Relevant Orders   TSH   CBC with Differential/Platelet    Other Visit Diagnoses    Annual physical exam    -  Primary   Relevant Orders   Comprehensive metabolic panel   Lipid panel   TSH   CBC with Differential/Platelet   Hemoglobin A1c   Need for influenza  vaccination       Relevant Orders   Flu Vaccine QUAD 6+ mos PF IM (Fluarix Quad PF) (Completed)   Need for Tdap vaccination       Relevant Orders   Tdap vaccine greater than or equal to 7yo IM (Completed)   High risk medication use       Relevant Orders   Comprehensive metabolic panel   Lipid panel   TSH   CBC with Differential/Platelet   Hemoglobin A1c       INJECTION: Patient was given informed consent,. Appropriate time out was taken. Area prepped and draped in usual sterile fashion. 2 cc of depo-medrol 40 mg/ml plus  4 cc of 1% lidocaine without epinephrine was injected into the R knee using a(n) anteromedial approach. The patient tolerated  the procedure well. There were no complications. Post procedure instructions were given.    Return in about 6 months (around 05/26/2020) for chronic disease f/u.     I, [name], MD, have reviewed all documentation for this visit. The documentation on 11/27/19 for the exam, diagnosis, procedures, and orders are all accurate and complete.   [place], Dionne Bucy, MD, MPH [facility]

## 2019-11-27 NOTE — Assessment & Plan Note (Signed)
3-5 minute discussion regarding the harms of tobacco use, the benefits of cessation, and methods of cessation Discussed that there are medication options to help with cessation Patient is currently precontemplative  Will reassess at next visit  

## 2019-11-27 NOTE — Assessment & Plan Note (Signed)
Discussed importance of healthy weight management Discussed diet and exercise  

## 2019-11-27 NOTE — Assessment & Plan Note (Signed)
Well controlled Continue current medications Recheck metabolic panel F/u in 6 months  

## 2019-11-27 NOTE — Patient Instructions (Signed)

## 2019-11-27 NOTE — Assessment & Plan Note (Signed)
Long standing issue related to OA Discussed conservative management - including light exercise Can use NSAIDs prn Has done well with corticosteroids in the past and agrees to joint injection today - see procedure note

## 2019-11-27 NOTE — Assessment & Plan Note (Signed)
Worsening recently wil lcheck labs for possible underlying etiologies

## 2019-11-27 NOTE — Assessment & Plan Note (Signed)
Recommend low carb diet °Recheck A1c  °

## 2019-11-28 ENCOUNTER — Telehealth: Payer: Self-pay

## 2019-11-28 LAB — COMPREHENSIVE METABOLIC PANEL
ALT: 14 IU/L (ref 0–32)
AST: 14 IU/L (ref 0–40)
Albumin/Globulin Ratio: 1.7 (ref 1.2–2.2)
Albumin: 4.3 g/dL (ref 3.8–4.9)
Alkaline Phosphatase: 118 IU/L (ref 44–121)
BUN/Creatinine Ratio: 26 — ABNORMAL HIGH (ref 9–23)
BUN: 19 mg/dL (ref 6–24)
Bilirubin Total: <0.2 mg/dL (ref 0.0–1.2)
CO2: 23 mmol/L (ref 20–29)
Calcium: 9.7 mg/dL (ref 8.7–10.2)
Chloride: 103 mmol/L (ref 96–106)
Creatinine, Ser: 0.74 mg/dL (ref 0.57–1.00)
GFR calc Af Amer: 103 mL/min/{1.73_m2} (ref >59)
GFR calc non Af Amer: 90 mL/min/{1.73_m2} (ref >59)
Globulin, Total: 2.5 g/dL (ref 1.5–4.5)
Glucose: 87 mg/dL (ref 65–99)
Potassium: 3.7 mmol/L (ref 3.5–5.2)
Sodium: 142 mmol/L (ref 134–144)
Total Protein: 6.8 g/dL (ref 6.0–8.5)

## 2019-11-28 LAB — CBC WITH DIFFERENTIAL/PLATELET
Basophils Absolute: 0.1 10*3/uL (ref 0.0–0.2)
Basos: 1 %
EOS (ABSOLUTE): 0.1 10*3/uL (ref 0.0–0.4)
Eos: 1 %
Hematocrit: 39.8 % (ref 34.0–46.6)
Hemoglobin: 12.9 g/dL (ref 11.1–15.9)
Immature Grans (Abs): 0 10*3/uL (ref 0.0–0.1)
Immature Granulocytes: 0 %
Lymphocytes Absolute: 2.5 10*3/uL (ref 0.7–3.1)
Lymphs: 30 %
MCH: 26.7 pg (ref 26.6–33.0)
MCHC: 32.4 g/dL (ref 31.5–35.7)
MCV: 82 fL (ref 79–97)
Monocytes Absolute: 0.5 10*3/uL (ref 0.1–0.9)
Monocytes: 6 %
Neutrophils Absolute: 5.2 10*3/uL (ref 1.4–7.0)
Neutrophils: 62 %
Platelets: 293 10*3/uL (ref 150–450)
RBC: 4.83 x10E6/uL (ref 3.77–5.28)
RDW: 12.3 % (ref 11.7–15.4)
WBC: 8.3 10*3/uL (ref 3.4–10.8)

## 2019-11-28 LAB — LIPID PANEL
Chol/HDL Ratio: 4.1 ratio (ref 0.0–4.4)
Cholesterol, Total: 214 mg/dL — ABNORMAL HIGH (ref 100–199)
HDL: 52 mg/dL (ref >39)
LDL Chol Calc (NIH): 137 mg/dL — ABNORMAL HIGH (ref 0–99)
Triglycerides: 141 mg/dL (ref 0–149)
VLDL Cholesterol Cal: 25 mg/dL (ref 5–40)

## 2019-11-28 LAB — HEMOGLOBIN A1C
Est. average glucose Bld gHb Est-mCnc: 114 mg/dL
Hgb A1c MFr Bld: 5.6 % (ref 4.8–5.6)

## 2019-11-28 LAB — TSH: TSH: 0.983 u[IU]/mL (ref 0.450–4.500)

## 2019-11-28 MED ORDER — ATORVASTATIN CALCIUM 40 MG PO TABS: 40.0000 mg | ORAL_TABLET | Freq: Every day | ORAL | 1 refills | Status: DC

## 2019-11-28 NOTE — Telephone Encounter (Signed)
-----   Message from Virginia Crews, MD sent at 11/28/2019 10:26 AM EDT ----- Normal labs, except high cholesterol.  The 10-year ASCVD (heart disease/stroke) risk score Mikey Bussing DC Jr., et al., 2013) is: 8.9%, which is high.  Recommend statin and recommend diet low in saturated fat and regular exercise - 30 min at least 5 times per week. If agreeable, can start Atorvastatin 40 mg daily, #90 r1

## 2019-11-28 NOTE — Telephone Encounter (Signed)
Patient advised as directed below. 

## 2019-12-29 ENCOUNTER — Other Ambulatory Visit: Payer: Self-pay | Admitting: Family Medicine

## 2019-12-29 NOTE — Telephone Encounter (Signed)
   Notes to clinic:  medication filled by a different provider  Review for refill    Requested Prescriptions  Pending Prescriptions Disp Refills   KLOR-CON M20 20 MEQ tablet [Pharmacy Med Name: KLOR-CON M20 TABLET] 30 tablet 3    Sig: [place] DAY      Endocrinology:  Minerals - Potassium Supplementation Passed - 12/29/2019  8:55 AM      Passed - K in normal range and within 360 days    Potassium  Date Value Ref Range Status  11/27/2019 3.7 3.5 - 5.2 mmol/L Final          Passed - Cr in normal range and within 360 days    Creatinine, Ser  Date Value Ref Range Status  11/27/2019 0.74 0.57 - 1.00 mg/dL Final          Passed - Valid encounter within last 12 months    Recent Outpatient Visits           1 month ago Annual physical exam   [facility] [place], Dionne Bucy, MD   5 months ago Shoulder impingement, right   [facility] [place], Dionne Bucy, MD   6 months ago Sprain of right rotator cuff capsule, initial encounter   [facility] [place], Dionne Bucy, MD   8 months ago Essential hypertension   [facility] [place], Dionne Bucy, MD   11 months ago Essential hypertension   [place], Dionne Bucy, MD       Future Appointments             In 5 months Bacigalupo, Dionne Bucy, MD [facility] [place]

## 2020-01-15 ENCOUNTER — Telehealth: Payer: Self-pay

## 2020-01-15 DIAGNOSIS — F3178 Bipolar disorder, in full remission, most recent episode mixed: Secondary | ICD-10-CM

## 2020-01-15 MED ORDER — LAMOTRIGINE 150 MG PO TABS: 150.0000 mg | ORAL_TABLET | Freq: Every day | ORAL | 3 refills | Status: DC

## 2020-01-15 NOTE — Telephone Encounter (Signed)
Message was left that pt needed refills on the lamictal    lamoTRIgine (LAMICTAL) 150 MG tablet Medication Date: 09/18/2019 Department: [facility] Ordering/Authorizing: Orlene Erm, MD  Order Providers  Prescribing Provider Encounter Provider  Orlene Erm, MD Ursula Alert, MD  Outpatient Medication Detail   Disp Refills Start End   lamoTRIgine (LAMICTAL) 150 MG tablet 30 tablet 3 09/18/2019    Sig: TAKE 1 TABLET BY MOUTH EVERY DAY   Sent to pharmacy as: lamoTRIgine (LAMICTAL) 150 MG tablet   E-Prescribing Status: Receipt confirmed by pharmacy (09/18/2019 2:30 PM EDT)   Associated Diagnoses  Bipolar disorder, in full remission, most recent episode mixed [facility])     Pharmacy  [id number] - [place], [place] - [address]

## 2020-01-15 NOTE — Telephone Encounter (Signed)
I have sent Lamictal to pharmacy. °

## 2020-01-22 ENCOUNTER — Telehealth (INDEPENDENT_AMBULATORY_CARE_PROVIDER_SITE_OTHER): Payer: Medicaid Other | Admitting: Psychiatry

## 2020-01-22 ENCOUNTER — Other Ambulatory Visit: Payer: Self-pay

## 2020-01-22 ENCOUNTER — Encounter: Payer: Self-pay | Admitting: Psychiatry

## 2020-01-22 DIAGNOSIS — F5105 Insomnia due to other mental disorder: Secondary | ICD-10-CM | POA: Diagnosis not present

## 2020-01-22 DIAGNOSIS — F17200 Nicotine dependence, unspecified, uncomplicated: Secondary | ICD-10-CM

## 2020-01-22 DIAGNOSIS — F1421 Cocaine dependence, in remission: Secondary | ICD-10-CM

## 2020-01-22 DIAGNOSIS — F411 Generalized anxiety disorder: Secondary | ICD-10-CM | POA: Diagnosis not present

## 2020-01-22 DIAGNOSIS — F3131 Bipolar disorder, current episode depressed, mild: Secondary | ICD-10-CM | POA: Diagnosis not present

## 2020-01-22 DIAGNOSIS — Z8659 Personal history of other mental and behavioral disorders: Secondary | ICD-10-CM

## 2020-01-22 MED ORDER — DOXEPIN HCL 10 MG PO CAPS: 10.0000 mg | ORAL_CAPSULE | Freq: Every day | ORAL | 1 refills | Status: DC
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
## 2020-02-05 ENCOUNTER — Other Ambulatory Visit: Payer: Self-pay

## 2020-02-05 ENCOUNTER — Ambulatory Visit (INDEPENDENT_AMBULATORY_CARE_PROVIDER_SITE_OTHER): Payer: Medicaid Other | Admitting: Licensed Clinical Social Worker

## 2020-02-05 ENCOUNTER — Encounter: Payer: Self-pay | Admitting: Licensed Clinical Social Worker

## 2020-02-05 DIAGNOSIS — F411 Generalized anxiety disorder: Secondary | ICD-10-CM

## 2020-02-05 DIAGNOSIS — F1421 Cocaine dependence, in remission: Secondary | ICD-10-CM

## 2020-02-05 DIAGNOSIS — F3131 Bipolar disorder, current episode depressed, mild: Secondary | ICD-10-CM
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
## 2020-02-06 ENCOUNTER — Telehealth: Payer: Self-pay

## 2020-02-06 NOTE — Telephone Encounter (Signed)
Copied from [place] [id number]. Topic: Quick Communication - See Telephone Encounter >> Feb 06, 2020  3:03 PM Loma [place] wrote: CRM for notification. See Telephone encounter for: 02/06/20. This pt can not see dr B till Fri. She is having severe pain in back running down her leg, she ask if she could see, Dr Caryn Section, he has an opening in the morning, she mentioned if anyone else Dr Caryn Section had really helped her before. I sch, and then remembered he may not see other providers, front desk said to send an CRM, pt is really upset she cannot see him, she is in pain and seems to have a lot of confidence in him.  I have left the appt but call the pt number at [phone] if this is not doable. She knows it may be changed, but begged to try it.

## 2020-02-06 NOTE — Telephone Encounter (Signed)
Patient advised that Dr. Caryn Section would be able to see her tomorrow for actue back pain issue.

## 2020-02-07 ENCOUNTER — Other Ambulatory Visit: Payer: Self-pay

## 2020-02-07 ENCOUNTER — Encounter: Payer: Self-pay | Admitting: Family Medicine

## 2020-02-07 ENCOUNTER — Ambulatory Visit: Payer: Medicaid Other | Admitting: Family Medicine

## 2020-02-07 VITALS — BP 125/79 | HR 94 | Temp 98.6°F | Resp 16 | Wt 173.0 lb

## 2020-02-07 DIAGNOSIS — E785 Hyperlipidemia, unspecified: Secondary | ICD-10-CM | POA: Diagnosis not present

## 2020-02-07 DIAGNOSIS — M545 Low back pain, unspecified: Secondary | ICD-10-CM

## 2020-02-07 DIAGNOSIS — M25511 Pain in right shoulder: Secondary | ICD-10-CM | POA: Diagnosis not present

## 2020-02-07 DIAGNOSIS — M79604 Pain in right leg: Secondary | ICD-10-CM | POA: Diagnosis not present

## 2020-02-07 MED ORDER — TRAMADOL HCL 50 MG PO TABS: 50.0000 mg | ORAL_TABLET | Freq: Three times a day (TID) | ORAL | 0 refills | Status: AC | PRN

## 2020-02-07 NOTE — Progress Notes (Signed)
Established patient visit   Patient: Alexa Newman   DOB: 1960/05/16   59 y.o. Female  MRN: [id number] Visit Date: 02/07/2020  Today's healthcare provider: Lelon Huh, MD   Chief Complaint  Patient presents with  . Back Pain   Subjective    Back Pain This is a new problem. Episode onset: 2 months ago. The problem occurs constantly. The problem has been gradually worsening since onset. Pain location: upper back near shoulders. Quality: spasms. The pain radiates to the right knee. Associated symptoms include numbness (in feet). Pertinent negatives include no abdominal pain, chest pain, fever or weakness. She has tried NSAIDs and muscle relaxant for the symptoms. The treatment provided no relief.   Patient states started having some pains a few months ago shortly after starting Atorvastatin. She also started having sense of feeling spaced out since starting atorvastatin. Pain in the back of her shoulder is mainly in the back of her right shoulder and is worse when leaning forward. Other pain is focused right lower back midway between spine and hip and seems to radiate through right leg. She does have long history of generalized back pain, but these pains are not typical for her and are much more severe sometimes keeping her up at night. She has been using heat pads which haven't helped.       Medications: Outpatient Medications Prior to Visit  Medication Sig  . amLODipine (NORVASC) 10 MG tablet TAKE 1 TABLET BY MOUTH EVERY DAY  . atorvastatin (LIPITOR) 40 MG tablet Take 1 tablet (40 mg total) by mouth daily.  Marland Kitchen doxepin (SINEQUAN) 10 MG capsule Take 1 capsule (10 mg total) by mouth at bedtime. Mood and sleep  . KLOR-CON M20 20 MEQ tablet TAKE 1 TABLET BY MOUTH EVERY DAY  . lamoTRIgine (LAMICTAL) 150 MG tablet Take 1 tablet (150 mg total) by mouth daily.  . meloxicam (MOBIC) 15 MG tablet TAKE 1 TABLET BY MOUTH EVERY DAY  . methocarbamol (ROBAXIN) 500 MG tablet Take 500 mg by mouth  2 (two) times daily.  Marland Kitchen triamterene-hydrochlorothiazide (MAXZIDE-25) 37.5-25 MG tablet TAKE 1 TABLET BY MOUTH EVERY DAY   No facility-administered medications prior to visit.    Review of Systems  Constitutional: Negative for appetite change, chills, fatigue and fever.  Respiratory: Negative for chest tightness and shortness of breath.   Cardiovascular: Negative for chest pain and palpitations.  Gastrointestinal: Negative for abdominal pain, nausea and vomiting.  Musculoskeletal: Positive for back pain and myalgias.  Neurological: Positive for numbness (in feet). Negative for dizziness and weakness.      Objective    BP 125/79 (BP Location: Left Arm, Patient Position: Sitting, Cuff Size: Normal)   Pulse 94   Temp 98.6 F (37 C) (Oral)   Resp 16   Wt 173 lb (78.5 kg)   LMP 12/07/2011   BMI 29.70 kg/m    Physical Exam   General appearance:  Overweight female, cooperative and in no acute distress Head: Normocephalic, without obvious abnormality, atraumatic Respiratory: Respirations even and unlabored, normal respiratory rate MS: Tender over right posterior shoulder musculature. FROM of shoulder. No swelling or other gross deformities. No spine tenderness . Tender right lower lateral back musculature. No swelling or other gross abnormalities. Unable to reproduce leg pain  No results found for any visits on 02/07/20.  Assessment & Plan     1. Acute pain of right shoulder No known injury.   2. Low back pain radiating to right leg No  known injury.   Recommend she use regular application of ice rather than heat pads. Is already on meloxicam. Can take tramadol at bedtime to help rest better.  Unclear if starting atorvastatin therapy had any role, but she can put statin on hold for a few weeks to see if that has any effect.   3. Hyperlipidemia, unspecified hyperlipidemia type On atorvastatin about 2 months. Unclear of MS related to statin, she also note feeling more spaced out  and forgetful since starting statin. Can put atorvastatin hold for a few weeks and consider changed to statin with less CNS penetration if this improves.   Follow up with Dr. B in 2-3 weeks.      The entirety of the information documented in the History of Present Illness, Review of Systems and Physical Exam were personally obtained by me. Portions of this information were initially documented by the CMA and reviewed by me for thoroughness and accuracy.      Lelon Huh, MD  [facility] [phone] (phone) [phone] (fax)  [place]

## 2020-02-07 NOTE — Patient Instructions (Addendum)
•   Please review the attached list of medications and notify my office if there are any errors.    Only take the tramadol at bedtime to help you rest   Put atorvastatin on hold for a few weeks to see if that helps with muscle pains and brain fog   Apply ice to shoulder and lower back 2-3 times a day    You cant take OTC Tylenol according to directions on label in addition to meloxicam if needed

## 2020-02-08 ENCOUNTER — Other Ambulatory Visit: Payer: Self-pay | Admitting: Family Medicine

## 2020-02-09 ENCOUNTER — Encounter (INDEPENDENT_AMBULATORY_CARE_PROVIDER_SITE_OTHER): Payer: Self-pay

## 2020-02-13 ENCOUNTER — Other Ambulatory Visit: Payer: Self-pay | Admitting: Psychiatry

## 2020-02-13 DIAGNOSIS — F5105 Insomnia due to other mental disorder: Secondary | ICD-10-CM

## 2020-02-13 DIAGNOSIS — F411 Generalized anxiety disorder: Secondary | ICD-10-CM

## 2020-02-19 ENCOUNTER — Encounter: Payer: Self-pay | Admitting: Psychiatry

## 2020-02-19 ENCOUNTER — Telehealth (INDEPENDENT_AMBULATORY_CARE_PROVIDER_SITE_OTHER): Payer: Medicaid Other | Admitting: Psychiatry

## 2020-02-19 ENCOUNTER — Other Ambulatory Visit: Payer: Self-pay

## 2020-02-19 DIAGNOSIS — F3131 Bipolar disorder, current episode depressed, mild: Secondary | ICD-10-CM | POA: Diagnosis not present

## 2020-02-19 DIAGNOSIS — F411 Generalized anxiety disorder: Secondary | ICD-10-CM | POA: Diagnosis not present

## 2020-02-19 DIAGNOSIS — F5105 Insomnia due to other mental disorder: Secondary | ICD-10-CM | POA: Diagnosis not present

## 2020-02-19 DIAGNOSIS — F1421 Cocaine dependence, in remission: Secondary | ICD-10-CM

## 2020-02-19 DIAGNOSIS — F17200 Nicotine dependence, unspecified, uncomplicated: Secondary | ICD-10-CM | POA: Diagnosis not present

## 2020-02-19 DIAGNOSIS — Z8659 Personal history of other mental and behavioral disorders: Secondary | ICD-10-CM
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
## 2020-02-22 NOTE — Progress Notes (Signed)
Established patient visit   Patient: Alexa Newman   DOB: [birth year]   59 y.o. Female  MRN: [id number] Visit Date: 02/23/2020  Today's healthcare provider: [name], MD   Chief Complaint  Patient presents with  . Follow-up   Subjective    HPI   LOW BACK PAIN-Patient here to follow up on her back pain.  She was seen on 02/07/20 by Dr Caryn Section for low back pain that radiated down her right leg.   She reported no known injury.  He recommended she use ice, continue with the Meloxicam she was already taking and also advised she could use Tramadol at night.  He stated he was not certain if this was related to her starting the statin and wanted her to discuss it with her PCP during today's visit.   Patient denies any pain after staying off atorvastatin.   Patient C/O right knee pain and swelling on and off for several years. Patient reports injury and surgery of ACL.   Medications: Outpatient Medications Prior to Visit  Medication Sig  . amLODipine (NORVASC) 10 MG tablet TAKE 1 TABLET BY MOUTH EVERY DAY  . KLOR-CON M20 20 MEQ tablet TAKE 1 TABLET BY MOUTH EVERY DAY  . lamoTRIgine (LAMICTAL) 150 MG tablet Take 1 tablet (150 mg total) by mouth daily.  . meloxicam (MOBIC) 15 MG tablet TAKE 1 TABLET BY MOUTH EVERY DAY  . methocarbamol (ROBAXIN) 500 MG tablet Take 500 mg by mouth 2 (two) times daily.  Marland Kitchen triamterene-hydrochlorothiazide (MAXZIDE-25) 37.5-25 MG tablet TAKE 1 TABLET BY MOUTH EVERY DAY  . [DISCONTINUED] atorvastatin (LIPITOR) 40 MG tablet Take 1 tablet (40 mg total) by mouth daily. (Patient not taking: Reported on 02/23/2020)   No facility-administered medications prior to visit.    Review of Systems  Constitutional: Negative.   Respiratory: Negative.   Cardiovascular: Negative.   Musculoskeletal: Negative for back pain, gait problem and myalgias.       Objective    BP 130/72 (BP Location: Left Arm, Patient Position: Sitting, Cuff Size: Normal)   Pulse  82   Temp 98.2 F (36.8 C) (Oral)   Resp 16   Wt 173 lb 1.6 oz (78.5 kg)   LMP 12/07/2011   SpO2 99%   BMI 29.71 kg/m     Physical Exam Vitals reviewed.  Constitutional:      General: She is not in acute distress.    Appearance: Normal appearance. She is well-developed. She is not diaphoretic.  HENT:     Head: Normocephalic and atraumatic.  Eyes:     General: No scleral icterus.    Conjunctiva/sclera: Conjunctivae normal.  Neck:     Thyroid: No thyromegaly.  Cardiovascular:     Rate and Rhythm: Normal rate and regular rhythm.     Pulses: Normal pulses.     Heart sounds: Normal heart sounds. No murmur heard.   Pulmonary:     Effort: Pulmonary effort is normal. No respiratory distress.     Breath sounds: Normal breath sounds. No wheezing, rhonchi or rales.  Musculoskeletal:     Cervical back: Neck supple.     Right lower leg: No edema.     Left lower leg: No edema.  Lymphadenopathy:     Cervical: No cervical adenopathy.  Skin:    General: Skin is warm and dry.     Findings: No rash.  Neurological:     Mental Status: She is alert and oriented to person, place, and time.  Mental status is at baseline.  Psychiatric:        Mood and Affect: Mood normal.        Behavior: Behavior normal.       No results found for any visits on 02/23/20.  Assessment & Plan     Problem List Items Addressed This Visit      Other   Chronic pain of right knee    Longstanding issue related to OA Continue conservative management with NSAIDs as needed Referral to Ortho for further evaluation and management per patient request      Relevant Orders   Ambulatory referral to Orthopedic Surgery   Hyperlipidemia    Did not tolerate atorvastatin due to myalgias Start coq.10 Pending labs, will start Crestor 5 mg every other day and consider further dose titration of that if she tolerates well Recheck LFTs to ensure no hepatic injury from statin      Relevant Medications    rosuvastatin (CRESTOR) 5 MG tablet   Other Relevant Orders   Hepatic function panel   Myalgia due to statin - Primary    Myalgias have now resolved after stopping atorvastatin If taking a statin in the future, needs to take coq.10, which we will start now Check CK If this is normal, we will switch to low-dose Crestor If this is elevated, will need to trend      Relevant Orders   CK (Creatine Kinase)   Hepatic function panel       Return in about 3 months (around 05/23/2020) for chronic disease f/u, as scheduled.      I, [name], MD, have reviewed all documentation for this visit. The documentation on 02/23/20 for the exam, diagnosis, procedures, and orders are all accurate and complete.   Mayo Owczarzak, Dionne Bucy, MD, MPH [facility]

## 2020-02-23 ENCOUNTER — Encounter: Payer: Self-pay | Admitting: Family Medicine

## 2020-02-23 ENCOUNTER — Ambulatory Visit: Payer: Medicaid Other | Admitting: Family Medicine

## 2020-02-23 ENCOUNTER — Other Ambulatory Visit: Payer: Self-pay

## 2020-02-23 VITALS — BP 130/72 | HR 82 | Temp 98.2°F | Resp 16 | Wt 173.1 lb

## 2020-02-23 DIAGNOSIS — G8929 Other chronic pain: Secondary | ICD-10-CM

## 2020-02-23 DIAGNOSIS — M25561 Pain in right knee: Secondary | ICD-10-CM | POA: Diagnosis not present

## 2020-02-23 DIAGNOSIS — M791 Myalgia, unspecified site: Secondary | ICD-10-CM

## 2020-02-23 DIAGNOSIS — E785 Hyperlipidemia, unspecified: Secondary | ICD-10-CM | POA: Insufficient documentation

## 2020-02-23 DIAGNOSIS — T466X5A Adverse effect of antihyperlipidemic and antiarteriosclerotic drugs, initial encounter: Secondary | ICD-10-CM

## 2020-02-23 DIAGNOSIS — E782 Mixed hyperlipidemia: Secondary | ICD-10-CM

## 2020-02-23 MED ORDER — ROSUVASTATIN CALCIUM 5 MG PO TABS: 5.0000 mg | ORAL_TABLET | ORAL | 3 refills | Status: DC

## 2020-02-23 NOTE — Assessment & Plan Note (Signed)
Longstanding issue related to OA Continue conservative management with NSAIDs as needed Referral to Ortho for further evaluation and management per patient request

## 2020-02-23 NOTE — Assessment & Plan Note (Signed)
Myalgias have now resolved after stopping atorvastatin If taking a statin in the future, needs to take coq.10, which we will start now Check CK If this is normal, we will switch to low-dose Crestor If this is elevated, will need to trend

## 2020-02-23 NOTE — Assessment & Plan Note (Signed)
Did not tolerate atorvastatin due to myalgias Start coq.10 Pending labs, will start Crestor 5 mg every other day and consider further dose titration of that if she tolerates well Recheck LFTs to ensure no hepatic injury from statin

## 2020-02-23 NOTE — Patient Instructions (Signed)

## 2020-03-14 DIAGNOSIS — E782 Mixed hyperlipidemia: Secondary | ICD-10-CM | POA: Diagnosis not present

## 2020-03-14 DIAGNOSIS — M1711 Unilateral primary osteoarthritis, right knee: Secondary | ICD-10-CM | POA: Diagnosis not present

## 2020-03-14 DIAGNOSIS — M791 Myalgia, unspecified site: Secondary | ICD-10-CM | POA: Diagnosis not present

## 2020-03-14 DIAGNOSIS — M25561 Pain in right knee: Secondary | ICD-10-CM | POA: Diagnosis not present

## 2020-03-14 DIAGNOSIS — T466X5A Adverse effect of antihyperlipidemic and antiarteriosclerotic drugs, initial encounter: Secondary | ICD-10-CM | POA: Diagnosis not present

## 2020-03-15 LAB — HEPATIC FUNCTION PANEL
ALT: 16 IU/L (ref 0–32)
AST: 13 IU/L (ref 0–40)
Albumin: 4.5 g/dL (ref 3.8–4.9)
Alkaline Phosphatase: 126 IU/L — ABNORMAL HIGH (ref 44–121)
Bilirubin Total: 0.4 mg/dL (ref 0.0–1.2)
Bilirubin, Direct: 0.1 mg/dL (ref 0.00–0.40)
Total Protein: 6.8 g/dL (ref 6.0–8.5)

## 2020-03-15 LAB — CK: Total CK: 235 U/L — ABNORMAL HIGH (ref 32–182)

## 2020-03-27 DIAGNOSIS — M1711 Unilateral primary osteoarthritis, right knee: Secondary | ICD-10-CM | POA: Diagnosis not present

## 2020-04-01 ENCOUNTER — Other Ambulatory Visit: Payer: Self-pay

## 2020-04-01 ENCOUNTER — Encounter: Payer: Self-pay | Admitting: Psychiatry

## 2020-04-01 ENCOUNTER — Telehealth (INDEPENDENT_AMBULATORY_CARE_PROVIDER_SITE_OTHER): Payer: Medicaid Other | Admitting: Psychiatry

## 2020-04-01 DIAGNOSIS — F17200 Nicotine dependence, unspecified, uncomplicated: Secondary | ICD-10-CM | POA: Diagnosis not present

## 2020-04-01 DIAGNOSIS — F3176 Bipolar disorder, in full remission, most recent episode depressed: Secondary | ICD-10-CM

## 2020-04-01 DIAGNOSIS — F5105 Insomnia due to other mental disorder: Secondary | ICD-10-CM

## 2020-04-01 DIAGNOSIS — Z8659 Personal history of other mental and behavioral disorders: Secondary | ICD-10-CM

## 2020-04-01 DIAGNOSIS — F1421 Cocaine dependence, in remission: Secondary | ICD-10-CM

## 2020-04-01 DIAGNOSIS — F411 Generalized anxiety disorder: Secondary | ICD-10-CM | POA: Diagnosis not present
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
## 2020-04-09 ENCOUNTER — Other Ambulatory Visit: Payer: Self-pay | Admitting: Psychiatry

## 2020-04-09 DIAGNOSIS — F3178 Bipolar disorder, in full remission, most recent episode mixed: Secondary | ICD-10-CM

## 2020-05-01 ENCOUNTER — Other Ambulatory Visit: Payer: Self-pay | Admitting: *Deleted

## 2020-05-01 ENCOUNTER — Telehealth: Payer: Self-pay | Admitting: *Deleted

## 2020-05-01 DIAGNOSIS — F17200 Nicotine dependence, unspecified, uncomplicated: Secondary | ICD-10-CM

## 2020-05-01 DIAGNOSIS — Z87891 Personal history of nicotine dependence: Secondary | ICD-10-CM

## 2020-05-01 DIAGNOSIS — Z122 Encounter for screening for malignant neoplasm of respiratory organs: Secondary | ICD-10-CM

## 2020-05-01 NOTE — Progress Notes (Signed)
Contacted and scheduled for annual lung screening scan. Patient is a current smoker with a 46 pack year history.

## 2020-05-01 NOTE — Telephone Encounter (Signed)
Attempted to contact and schedule lung screening scan. Message left for patient to call back to schedule. 

## 2020-05-06 ENCOUNTER — Other Ambulatory Visit: Payer: Self-pay | Admitting: Family Medicine

## 2020-05-06 DIAGNOSIS — Z1231 Encounter for screening mammogram for malignant neoplasm of breast: Secondary | ICD-10-CM

## 2020-05-10 ENCOUNTER — Ambulatory Visit
Admission: RE | Admit: 2020-05-10 | Discharge: 2020-05-10 | Disposition: A | Discharge status: Home / Self Care | Payer: Medicaid Other | Source: Ambulatory Visit | Attending: Family Medicine | Admitting: Family Medicine

## 2020-05-10 ENCOUNTER — Other Ambulatory Visit: Payer: Self-pay

## 2020-05-10 DIAGNOSIS — Z1231 Encounter for screening mammogram for malignant neoplasm of breast: Secondary | ICD-10-CM | POA: Insufficient documentation

## 2020-05-11 ENCOUNTER — Other Ambulatory Visit: Payer: Self-pay | Admitting: Family Medicine

## 2020-05-11 NOTE — Telephone Encounter (Signed)
Requested Prescriptions  Pending Prescriptions Disp Refills  . meloxicam (MOBIC) 15 MG tablet [Pharmacy Med Name: MELOXICAM 15 MG TABLET] 30 tablet 2    Sig: TAKE 1 TABLET BY MOUTH EVERY DAY     Analgesics:  COX2 Inhibitors Passed - 05/11/2020  8:55 AM      Passed - HGB in normal range and within 360 days    Hemoglobin  Date Value Ref Range Status  11/27/2019 12.9 11.1 - 15.9 g/dL Final         Passed - Cr in normal range and within 360 days    Creatinine, Ser  Date Value Ref Range Status  11/27/2019 0.74 0.57 - 1.00 mg/dL Final         Passed - Patient is not pregnant      Passed - Valid encounter within last 12 months    Recent Outpatient Visits          2 months ago Myalgia due to statin   [facility], Dionne Bucy, MD   3 months ago Acute pain of right shoulder   [facility] Birdie Sons, MD   5 months ago Annual physical exam   [facility], Dionne Bucy, MD   9 months ago Shoulder impingement, right   [facility], Dionne Bucy, MD   10 months ago Sprain of right rotator cuff capsule, initial encounter   [facility] [place], Dionne Bucy, MD      Future Appointments            In 2 weeks Bacigalupo, Dionne Bucy, MD [facility]           . triamterene-hydrochlorothiazide (MAXZIDE-25) 37.5-25 MG tablet [Pharmacy Med Name: TRIAMTERENE-HCTZ 37.5-25 MG TB] 90 tablet 1    Sig: TAKE 1 TABLET BY MOUTH EVERY DAY     Cardiovascular: Diuretic Combos Passed - 05/11/2020  8:55 AM      Passed - K in normal range and within 360 days    Potassium  Date Value Ref Range Status  11/27/2019 3.7 3.5 - 5.2 mmol/L Final         Passed - Na in normal range and within 360 days    Sodium  Date Value Ref Range Status  11/27/2019 142 134 - 144 mmol/L Final         Passed - Cr in normal range and within 360 days    Creatinine, Ser  Date Value Ref Range Status   11/27/2019 0.74 0.57 - 1.00 mg/dL Final         Passed - Ca in normal range and within 360 days    Calcium  Date Value Ref Range Status  11/27/2019 9.7 8.7 - 10.2 mg/dL Final         Passed - Last BP in normal range    BP Readings from Last 1 Encounters:  02/23/20 130/72         Passed - Valid encounter within last 6 months    Recent Outpatient Visits          2 months ago Myalgia due to statin   [facility], Dionne Bucy, MD   3 months ago Acute pain of right shoulder   [facility] Birdie Sons, MD   5 months ago Annual physical exam   [facility], Dionne Bucy, MD   9 months ago Shoulder impingement, right   [place], [place],  MD   10 months ago Sprain of right rotator cuff capsule, initial encounter   [facility], Dionne Bucy, MD      Future Appointments            In 2 weeks Bacigalupo, Dionne Bucy, MD [facility], [place]

## 2020-05-14 ENCOUNTER — Ambulatory Visit: Payer: Medicaid Other

## 2020-05-20 ENCOUNTER — Ambulatory Visit
Admission: RE | Admit: 2020-05-20 | Discharge: 2020-05-20 | Disposition: A | Discharge status: Home / Self Care | Payer: Medicaid Other | Source: Ambulatory Visit | Attending: Nurse Practitioner | Admitting: Nurse Practitioner

## 2020-05-20 ENCOUNTER — Other Ambulatory Visit: Payer: Self-pay

## 2020-05-20 DIAGNOSIS — Z87891 Personal history of nicotine dependence: Secondary | ICD-10-CM | POA: Insufficient documentation

## 2020-05-20 DIAGNOSIS — F17200 Nicotine dependence, unspecified, uncomplicated: Secondary | ICD-10-CM | POA: Insufficient documentation

## 2020-05-20 DIAGNOSIS — Z122 Encounter for screening for malignant neoplasm of respiratory organs: Secondary | ICD-10-CM | POA: Insufficient documentation

## 2020-05-24 NOTE — Patient Instructions (Addendum)
Try CoQ10 100mg  daily   Prediabetes Eating Plan Prediabetes is a condition that causes blood sugar (glucose) levels to be higher than normal. This increases the risk for developing type 2 diabetes (type 2 diabetes mellitus). Working with a health care provider or nutrition specialist (dietitian) to make diet and lifestyle changes can help prevent the onset of diabetes. These changes may help you:  Control your blood glucose levels.  Improve your cholesterol levels.  Manage your blood pressure. What are tips for following this plan? Reading food labels  Read food labels to check the amount of fat, salt (sodium), and sugar in prepackaged foods. Avoid foods that have: ? Saturated fats. ? Trans fats. ? Added sugars.  Avoid foods that have more than 300 milligrams (mg) of sodium per serving. Limit your sodium intake to less than 2,300 mg each day. Shopping  Avoid buying pre-made and processed foods.  Avoid buying drinks with added sugar. Cooking  Cook with olive oil. Do not use butter, lard, or ghee.  Bake, broil, grill, steam, or boil foods. Avoid frying. Meal planning  Work with your dietitian to create an eating plan that is right for you. This may include tracking how many calories you take in each day. Use a food diary, notebook, or mobile application to track what you eat at each meal.  Consider following a Mediterranean diet. This includes: ? Eating several servings of fresh fruits and vegetables each day. ? Eating fish at least twice a week. ? Eating one serving each day of whole grains, beans, nuts, and seeds. ? Using olive oil instead of other fats. ? Limiting alcohol. ? Limiting red meat. ? Using nonfat or low-fat dairy products.  Consider following a plant-based diet. This includes dietary choices that focus on eating mostly vegetables and fruit, grains, beans, nuts, and seeds.  If you have high blood pressure, you may need to limit your sodium intake or follow a  diet such as the DASH (Dietary Approaches to Stop Hypertension) eating plan. The DASH diet aims to lower high blood pressure.   Lifestyle  Set weight loss goals with help from your health care team. It is recommended that most people with prediabetes lose 7% of their body weight.  Exercise for at least 30 minutes 5 or more days a week.  Attend a support group or seek support from a mental health counselor.  Take over-the-counter and prescription medicines only as told by your health care provider. What foods are recommended? Fruits Berries. Bananas. Apples. Oranges. Grapes. Papaya. Mango. Pomegranate. Kiwi. Grapefruit. Cherries. Vegetables Lettuce. Spinach. Peas. Beets. Cauliflower. Cabbage. Broccoli. Carrots. Tomatoes. Squash. Eggplant. Herbs. Peppers. Onions. Cucumbers. Brussels sprouts. Grains Whole grains, such as whole-wheat or whole-grain breads, crackers, cereals, and pasta. Unsweetened oatmeal. Bulgur. Barley. Quinoa. Brown rice. Corn or whole-wheat flour tortillas or taco shells. Meats and other proteins Seafood. Poultry without skin. Lean cuts of pork and beef. Tofu. Eggs. Nuts. Beans. Dairy Low-fat or fat-free dairy products, such as yogurt, cottage cheese, and cheese. Beverages Water. Tea. Coffee. Sugar-free or diet soda. Seltzer water. Low-fat or nonfat milk. Milk alternatives, such as soy or almond milk. Fats and oils Olive oil. Canola oil. Sunflower oil. Grapeseed oil. Avocado. Walnuts. Sweets and desserts Sugar-free or low-fat pudding. Sugar-free or low-fat ice cream and other frozen treats. Seasonings and condiments Herbs. Sodium-free spices. Mustard. Relish. Low-salt, low-sugar ketchup. Low-salt, low-sugar barbecue sauce. Low-fat or fat-free mayonnaise. The items listed above may not be a complete list of recommended foods and  beverages. Contact a dietitian for more information. What foods are not recommended? Fruits Fruits canned with syrup. Vegetables Canned  vegetables. Frozen vegetables with butter or cream sauce. Grains Refined white flour and flour products, such as bread, pasta, snack foods, and cereals. Meats and other proteins Fatty cuts of meat. Poultry with skin. Breaded or fried meat. Processed meats. Dairy Full-fat yogurt, cheese, or milk. Beverages Sweetened drinks, such as iced tea and soda. Fats and oils Butter. Lard. Ghee. Sweets and desserts Baked goods, such as cake, cupcakes, pastries, cookies, and cheesecake. Seasonings and condiments Spice mixes with added salt. Ketchup. Barbecue sauce. Mayonnaise. The items listed above may not be a complete list of foods and beverages that are not recommended. Contact a dietitian for more information. Where to find more information  American Diabetes Association: www.diabetes.org Summary  You may need to make diet and lifestyle changes to help prevent the onset of diabetes. These changes can help you control blood sugar, improve cholesterol levels, and manage blood pressure.  Set weight loss goals with help from your health care team. It is recommended that most people with prediabetes lose 7% of their body weight.  Consider following a Mediterranean diet. This includes eating plenty of fresh fruits and vegetables, whole grains, beans, nuts, seeds, fish, and low-fat dairy, and using olive oil instead of other fats. This information is not intended to replace advice given to you by your health care provider. Make sure you discuss any questions you have with your health care provider. Document Revised: 05/25/2019 Document Reviewed: 05/25/2019 Elsevier Patient Education  [place].

## 2020-05-24 NOTE — Progress Notes (Signed)
Established patient visit   Patient: Alexa Newman   DOB: [birth year]   60 y.o. Female  MRN: [id number] Visit Date: 05/27/2020  Today's healthcare provider: [name], MD   Chief Complaint  Patient presents with  . Hypertension  . Hyperlipidemia  . Prediabetes   Subjective    HPI  Hypertension, follow-up  BP Readings from Last 3 Encounters:  05/27/20 126/78  02/23/20 130/72  02/07/20 125/79   Wt Readings from Last 3 Encounters:  05/27/20 175 lb 12.8 oz (79.7 kg)  05/20/20 170 lb (77.1 kg)  02/23/20 173 lb 1.6 oz (78.5 kg)     She was last seen for hypertension 6 months ago.  BP at that visit was 129/80. Management since that visit includes no changes.  She reports excellent compliance with treatment. She is not having side effects.  She is following a Low Sodium diet. She is not exercising. She does smoke.  Use of agents associated with hypertension: none.   Outside blood pressures are stable. Symptoms: No chest pain No chest pressure  No palpitations No syncope  No dyspnea No orthopnea  No paroxysmal nocturnal dyspnea Yes lower extremity edema   Pertinent labs: Lab Results  Component Value Date   CHOL 214 (H) 11/27/2019   HDL 52 11/27/2019   LDLCALC 137 (H) 11/27/2019   TRIG 141 11/27/2019   CHOLHDL 4.1 11/27/2019   Lab Results  Component Value Date   NA 142 11/27/2019   K 3.7 11/27/2019   CREATININE 0.74 11/27/2019   GFRNONAA 90 11/27/2019   GFRAA 103 11/27/2019   GLUCOSE 87 11/27/2019     The 10-year ASCVD risk score Mikey Bussing DC Jr., et al., 2013) is: 9.1%   --------------------------------------------------------------------------------------------------- Lipid/Cholesterol, Follow-up  Last lipid panel Other pertinent labs  Lab Results  Component Value Date   CHOL 214 (H) 11/27/2019   HDL 52 11/27/2019   LDLCALC 137 (H) 11/27/2019   TRIG 141 11/27/2019   CHOLHDL 4.1 11/27/2019   Lab Results  Component Value Date   ALT 16  03/14/2020   AST 13 03/14/2020   PLT 293 11/27/2019   TSH 0.983 11/27/2019     She was last seen for this 3 months ago.  Management since that visit includes start Crestor 5 mg daily.  She reports excellent compliance with treatment. She is not having side effects.   Symptoms: No chest pain No chest pressure/discomfort  No dyspnea Yes lower extremity edema  No numbness or tingling of extremity No orthopnea  No palpitations No paroxysmal nocturnal dyspnea  No speech difficulty No syncope   Current diet: in general, a "healthy" diet   Current exercise: none  The 10-year ASCVD risk score Mikey Bussing DC Jr., et al., 2013) is: 9.1%  --------------------------------------------------------------------------------------------------- Prediabetes, Follow-up  Lab Results  Component Value Date   HGBA1C 5.8 (A) 05/27/2020   HGBA1C 5.6 11/27/2019   HGBA1C 5.9 (H) 04/19/2019   GLUCOSE 87 11/27/2019   GLUCOSE 93 04/19/2019   GLUCOSE 97 01/16/2019    Last seen for for this 6 months ago.  Management since that visit includes no changes. Current symptoms include none and have been stable.  Prior visit with dietician: no Current diet: in general, a "healthy" diet   Current exercise: none  Pertinent Labs:    Component Value Date/Time   CHOL 214 (H) 11/27/2019 1121   TRIG 141 11/27/2019 1121   CHOLHDL 4.1 11/27/2019 1121   CREATININE 0.74 11/27/2019 1121    Wt Readings  from Last 3 Encounters:  05/27/20 175 lb 12.8 oz (79.7 kg)  05/20/20 170 lb (77.1 kg)  02/23/20 173 lb 1.6 oz (78.5 kg)    -----------------------------------------------------------------------------------------  Patient Active Problem List   Diagnosis Date Noted  . Hyperlipidemia 02/23/2020  . Myalgia due to statin 02/23/2020  . Bipolar 1 disorder, depressed, mild ([place]) 02/19/2020  . GAD (generalized anxiety disorder) 01/22/2020  . Chronic pain of right knee 11/27/2019  . Fatigue 11/27/2019  . Bipolar  disorder, in full remission, most recent episode mixed ([place]) 08/09/2019  . Prediabetes 04/19/2019  . Right wrist pain 04/19/2019  . Hypokalemia 01/16/2019  . Overweight 10/17/2018  . Bipolar 1 disorder, mixed, moderate ([place]) 10/07/2018  . Hx of borderline personality disorder 10/07/2018  . Cocaine use disorder, moderate, in sustained remission ([place]) 10/07/2018  . Insomnia due to mental condition 10/07/2018  . Panic attacks 10/07/2018  . Benign neoplasm of ascending colon   . Internal hemorrhoids   . Polyp of sigmoid colon   . Intestinal lump   . Encounter for monitoring chronic NSAID therapy 07/13/2017  . Tobacco use disorder 07/13/2017  . Essential hypertension 07/13/2017  . Bipolar 1 disorder ([place])   . Anxiety   . Arthritis    Social History   Tobacco Use  . Smoking status: Current Every Day Smoker    Packs/day: 0.50    Years: 45.00    Pack years: 22.50    Types: Cigarettes  . Smokeless tobacco: Never Used  . Tobacco comment: started smoking at age 46  Vaping Use  . Vaping Use: Former  Substance Use Topics  . Alcohol use: Yes    Comment: 1-2 times per month  . Drug use: Not Currently    Comment: former crack cocaine user   Allergies  Allergen Reactions  . Pregabalin Other (See Comments)    Other reaction(s): Other (See Comments) Suicidal ideation Other reaction(s): Other (See Comments) Suicidal ideation       Medications: Outpatient Medications Prior to Visit  Medication Sig  . amLODipine (NORVASC) 10 MG tablet TAKE 1 TABLET BY MOUTH EVERY DAY  . KLOR-CON M20 20 MEQ tablet TAKE 1 TABLET BY MOUTH EVERY DAY  . lamoTRIgine (LAMICTAL) 150 MG tablet TAKE 1 TABLET BY MOUTH EVERY DAY  . meloxicam (MOBIC) 15 MG tablet TAKE 1 TABLET BY MOUTH EVERY DAY  . methocarbamol (ROBAXIN) 500 MG tablet Take 500 mg by mouth 2 (two) times daily.  . rosuvastatin (CRESTOR) 5 MG tablet Take 1 tablet (5 mg total) by mouth every other day.  . triamterene-hydrochlorothiazide  (MAXZIDE-25) 37.5-25 MG tablet TAKE 1 TABLET BY MOUTH EVERY DAY  . [DISCONTINUED] atorvastatin (LIPITOR) 40 MG tablet 40 mg once daily (Patient not taking: Reported on 05/27/2020)   No facility-administered medications prior to visit.    Review of Systems  Constitutional: Negative for appetite change and fatigue.  Respiratory: Negative for chest tightness and shortness of breath.   Cardiovascular: Negative for chest pain and palpitations.    Last CBC Lab Results  Component Value Date   WBC 8.3 11/27/2019   HGB 12.9 11/27/2019   HCT 39.8 11/27/2019   MCV 82 11/27/2019   MCH 26.7 11/27/2019   RDW 12.3 11/27/2019   PLT 293 45/36/4680   Last metabolic panel Lab Results  Component Value Date   GLUCOSE 87 11/27/2019   NA 142 11/27/2019   K 3.7 11/27/2019   CL 103 11/27/2019   CO2 23 11/27/2019   BUN 19 11/27/2019  CREATININE 0.74 11/27/2019   GFRNONAA 90 11/27/2019   GFRAA 103 11/27/2019   CALCIUM 9.7 11/27/2019   PHOS 4.4 (H) 01/16/2019   PROT 6.8 03/14/2020   ALBUMIN 4.5 03/14/2020   LABGLOB 2.5 11/27/2019   AGRATIO 1.7 11/27/2019   BILITOT 0.4 03/14/2020   ALKPHOS 126 (H) 03/14/2020   AST 13 03/14/2020   ALT 16 03/14/2020   Last lipids Lab Results  Component Value Date   CHOL 214 (H) 11/27/2019   HDL 52 11/27/2019   LDLCALC 137 (H) 11/27/2019   TRIG 141 11/27/2019   CHOLHDL 4.1 11/27/2019   Last hemoglobin A1c Lab Results  Component Value Date   HGBA1C 5.8 (A) 05/27/2020        Objective    BP 126/78 (BP Location: Left Arm, Patient Position: Sitting, Cuff Size: Large)   Pulse 80   Temp 98.6 F (37 C) (Oral)   Resp 16   Wt 175 lb 12.8 oz (79.7 kg)   LMP 12/07/2011   SpO2 96%   BMI 31.14 kg/m  BP Readings from Last 3 Encounters:  05/27/20 126/78  02/23/20 130/72  02/07/20 125/79   Wt Readings from Last 3 Encounters:  05/27/20 175 lb 12.8 oz (79.7 kg)  05/20/20 170 lb (77.1 kg)  02/23/20 173 lb 1.6 oz (78.5 kg)      Physical Exam Vitals  reviewed.  Constitutional:      General: She is not in acute distress.    Appearance: Normal appearance. She is well-developed. She is not diaphoretic.  HENT:     Head: Normocephalic and atraumatic.  Eyes:     General: No scleral icterus.    Conjunctiva/sclera: Conjunctivae normal.  Neck:     Thyroid: No thyromegaly.  Cardiovascular:     Rate and Rhythm: Normal rate and regular rhythm.     Pulses: Normal pulses.     Heart sounds: Normal heart sounds. No murmur heard.   Pulmonary:     Effort: Pulmonary effort is normal. No respiratory distress.     Breath sounds: Normal breath sounds. No wheezing, rhonchi or rales.  Musculoskeletal:     Cervical back: Neck supple.     Right lower leg: No edema.     Left lower leg: No edema.  Lymphadenopathy:     Cervical: No cervical adenopathy.  Skin:    General: Skin is warm and dry.     Findings: No rash.  Neurological:     Mental Status: She is alert and oriented to person, place, and time. Mental status is at baseline.  Psychiatric:        Mood and Affect: Mood normal.        Behavior: Behavior normal.       Results for orders placed or performed in visit on 05/27/20  POCT glycosylated hemoglobin (Hb A1C)  Result Value Ref Range   Hemoglobin A1C 5.8 (A) 4.0 - 5.6 %   Est. average glucose Bld gHb Est-mCnc 120     Assessment & Plan     Problem List Items Addressed This Visit      Cardiovascular and Mediastinum   Essential hypertension - Primary    Well controlled Continue current medications Recheck metabolic panel F/u in 6 months       Relevant Orders   Comprehensive metabolic panel     Other   Bipolar 1 disorder ([place])    Followed by psych Continue current meds      Tobacco use disorder    3-5  minute discussion regarding the harms of tobacco use, the benefits of cessation, and methods of cessation Discussed that there are medication options to help with cessation Patient is currently precontemplative  Will  reassess at next visit       Cocaine use disorder, moderate, in sustained remission (HCC)    Sustained remission      Overweight    Discussed importance of healthy weight management Discussed diet and exercise       Prediabetes    Recommend low carb diet Stable A1c      Relevant Orders   POCT glycosylated hemoglobin (Hb A1C) (Completed)   Hyperlipidemia    Did not tolerate atorvastatin due to myalgias Continue coQ10 and Crestor Recheck FLP and CMP      Relevant Orders   Comprehensive metabolic panel   Lipid panel       Return in about 6 months (around 11/27/2020) for CPE.      I, [name], MD, have reviewed all documentation for this visit. The documentation on 05/27/20 for the exam, diagnosis, procedures, and orders are all accurate and complete.   Bacigalupo, Dionne Bucy, MD, MPH [facility]

## 2020-05-27 ENCOUNTER — Other Ambulatory Visit: Payer: Self-pay

## 2020-05-27 ENCOUNTER — Ambulatory Visit: Payer: Medicaid Other | Admitting: Family Medicine

## 2020-05-27 ENCOUNTER — Encounter: Payer: Self-pay | Admitting: Family Medicine

## 2020-05-27 VITALS — BP 126/78 | HR 80 | Temp 98.6°F | Resp 16 | Wt 175.8 lb

## 2020-05-27 DIAGNOSIS — F1421 Cocaine dependence, in remission: Secondary | ICD-10-CM

## 2020-05-27 DIAGNOSIS — E782 Mixed hyperlipidemia: Secondary | ICD-10-CM | POA: Diagnosis not present

## 2020-05-27 DIAGNOSIS — E663 Overweight: Secondary | ICD-10-CM

## 2020-05-27 DIAGNOSIS — Z6831 Body mass index (BMI) 31.0-31.9, adult: Secondary | ICD-10-CM

## 2020-05-27 DIAGNOSIS — F17200 Nicotine dependence, unspecified, uncomplicated: Secondary | ICD-10-CM

## 2020-05-27 DIAGNOSIS — I10 Essential (primary) hypertension: Secondary | ICD-10-CM

## 2020-05-27 DIAGNOSIS — E669 Obesity, unspecified: Secondary | ICD-10-CM | POA: Diagnosis not present

## 2020-05-27 DIAGNOSIS — R7303 Prediabetes: Secondary | ICD-10-CM

## 2020-05-27 DIAGNOSIS — F319 Bipolar disorder, unspecified: Secondary | ICD-10-CM

## 2020-05-27 LAB — POCT GLYCOSYLATED HEMOGLOBIN (HGB A1C)
Est. average glucose Bld gHb Est-mCnc: 120
Hemoglobin A1C: 5.8 % — AB (ref 4.0–5.6)

## 2020-05-27 NOTE — Assessment & Plan Note (Signed)
Well controlled Continue current medications Recheck metabolic panel F/u in 6 months  

## 2020-05-27 NOTE — Assessment & Plan Note (Signed)
Did not tolerate atorvastatin due to myalgias Continue coQ10 and Crestor Recheck FLP and CMP

## 2020-05-27 NOTE — Assessment & Plan Note (Signed)
Recommend low carb diet Stable A1c

## 2020-05-27 NOTE — Assessment & Plan Note (Signed)
Sustained remission

## 2020-05-27 NOTE — Assessment & Plan Note (Signed)
Discussed importance of healthy weight management Discussed diet and exercise  

## 2020-05-27 NOTE — Assessment & Plan Note (Signed)
Followed by psych Continue current meds

## 2020-05-27 NOTE — Assessment & Plan Note (Signed)
3-5 minute discussion regarding the harms of tobacco use, the benefits of cessation, and methods of cessation Discussed that there are medication options to help with cessation Patient is currently precontemplative  Will reassess at next visit  

## 2020-05-28 ENCOUNTER — Encounter: Payer: Self-pay | Admitting: *Deleted

## 2020-05-28 LAB — LIPID PANEL
Chol/HDL Ratio: 2.8 ratio (ref 0.0–4.4)
Cholesterol, Total: 155 mg/dL (ref 100–199)
HDL: 56 mg/dL (ref >39)
LDL Chol Calc (NIH): 77 mg/dL (ref 0–99)
Triglycerides: 126 mg/dL (ref 0–149)
VLDL Cholesterol Cal: 22 mg/dL (ref 5–40)

## 2020-05-28 LAB — COMPREHENSIVE METABOLIC PANEL
ALT: 15 IU/L (ref 0–32)
AST: 17 IU/L (ref 0–40)
Albumin/Globulin Ratio: 2 (ref 1.2–2.2)
Albumin: 4.7 g/dL (ref 3.8–4.9)
Alkaline Phosphatase: 120 IU/L (ref 44–121)
BUN/Creatinine Ratio: 21 (ref 9–23)
BUN: 14 mg/dL (ref 6–24)
Bilirubin Total: 0.3 mg/dL (ref 0.0–1.2)
CO2: 25 mmol/L (ref 20–29)
Calcium: 9.5 mg/dL (ref 8.7–10.2)
Chloride: 101 mmol/L (ref 96–106)
Creatinine, Ser: 0.67 mg/dL (ref 0.57–1.00)
Globulin, Total: 2.4 g/dL (ref 1.5–4.5)
Glucose: 100 mg/dL — ABNORMAL HIGH (ref 65–99)
Potassium: 3.7 mmol/L (ref 3.5–5.2)
Sodium: 142 mmol/L (ref 134–144)
Total Protein: 7.1 g/dL (ref 6.0–8.5)
eGFR: 101 mL/min/{1.73_m2} (ref >59)

## 2020-06-09 ENCOUNTER — Other Ambulatory Visit: Payer: Self-pay | Admitting: Family Medicine

## 2020-06-09 NOTE — Telephone Encounter (Signed)
Requested Prescriptions  Pending Prescriptions Disp Refills  . KLOR-CON M20 20 MEQ tablet [Pharmacy Med Name: KLOR-CON M20 TABLET] 90 tablet 1    Sig: TAKE 1 TABLET BY [place] DAY     Endocrinology:  Minerals - Potassium Supplementation Passed - 06/09/2020  9:42 AM      Passed - K in normal range and within 360 days    Potassium  Date Value Ref Range Status  05/27/2020 3.7 3.5 - 5.2 mmol/L Final         Passed - Cr in normal range and within 360 days    Creatinine, Ser  Date Value Ref Range Status  05/27/2020 0.67 0.57 - 1.00 mg/dL Final         Passed - Valid encounter within last 12 months    Recent Outpatient Visits          1 week ago Essential hypertension   [facility] [place], Dionne Bucy, MD   3 months ago Myalgia due to statin   [facility], Dionne Bucy, MD   4 months ago Acute pain of right shoulder   [facility] Birdie Sons, MD   6 months ago Annual physical exam   [facility], Dionne Bucy, MD   10 months ago Shoulder impingement, right   [facility], Dionne Bucy, MD      Future Appointments            In 5 months Bacigalupo, Dionne Bucy, MD [facility], [place]

## 2020-06-23 ENCOUNTER — Other Ambulatory Visit: Payer: Self-pay | Admitting: Family Medicine

## 2020-06-23 NOTE — Telephone Encounter (Signed)
Requested Prescriptions  Pending Prescriptions Disp Refills  . amLODipine (NORVASC) 10 MG tablet [Pharmacy Med Name: AMLODIPINE BESYLATE 10 MG TAB] 90 tablet 1    Sig: TAKE 1 TABLET BY MOUTH EVERY DAY     Cardiovascular:  Calcium Channel Blockers Passed - 06/23/2020  7:16 AM      Passed - Last BP in normal range    BP Readings from Last 1 Encounters:  05/27/20 126/78         Passed - Valid encounter within last 6 months    Recent Outpatient Visits          3 weeks ago Essential hypertension   [facility] [place], Dionne Bucy, MD   4 months ago Myalgia due to statin   [facility], Dionne Bucy, MD   4 months ago Acute pain of right shoulder   [facility] Birdie Sons, MD   6 months ago Annual physical exam   [facility], Dionne Bucy, MD   11 months ago Shoulder impingement, right   [facility], Dionne Bucy, MD      Future Appointments            In 5 months Bacigalupo, Dionne Bucy, MD [facility], [place]

## 2020-06-25 ENCOUNTER — Telehealth (INDEPENDENT_AMBULATORY_CARE_PROVIDER_SITE_OTHER): Payer: Medicaid Other | Admitting: Psychiatry

## 2020-06-25 ENCOUNTER — Encounter: Payer: Self-pay | Admitting: Psychiatry

## 2020-06-25 ENCOUNTER — Other Ambulatory Visit: Payer: Self-pay

## 2020-06-25 DIAGNOSIS — F17200 Nicotine dependence, unspecified, uncomplicated: Secondary | ICD-10-CM | POA: Diagnosis not present

## 2020-06-25 DIAGNOSIS — F5105 Insomnia due to other mental disorder: Secondary | ICD-10-CM | POA: Diagnosis not present

## 2020-06-25 DIAGNOSIS — F411 Generalized anxiety disorder: Secondary | ICD-10-CM | POA: Diagnosis not present

## 2020-06-25 DIAGNOSIS — F1421 Cocaine dependence, in remission: Secondary | ICD-10-CM

## 2020-06-25 DIAGNOSIS — Z8659 Personal history of other mental and behavioral disorders: Secondary | ICD-10-CM

## 2020-06-25 DIAGNOSIS — F3176 Bipolar disorder, in full remission, most recent episode depressed: Secondary | ICD-10-CM
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
## 2020-06-26 ENCOUNTER — Telehealth: Payer: Medicaid Other | Admitting: Psychiatry

## 2020-07-01 NOTE — Progress Notes (Signed)
Established patient visit   Patient: Alexa Newman   DOB: May 25, 1960   60 y.o. Female  MRN: [id number] Visit Date: 07/02/2020  Today's healthcare provider: Vernie Murders, PA-C   Chief Complaint  Patient presents with  . Hip Pain   I,Porsha C McClurkin,acting as a Education administrator for [employer], PA-C.,have documented all relevant documentation on the behalf of Vernie Murders, PA-C,as directed by  [employer], PA-C while in the presence of [employer], PA-C.  Subjective    Hip Pain  The incident occurred more than 1 week ago. There was no injury mechanism. The pain is present in the left hip. The quality of the pain is described as aching and stabbing. The pain is at a severity of 6/10. The pain is mild. The pain has been constant since onset. Associated symptoms include an inability to bear weight, numbness and tingling. Pertinent negatives include no loss of motion, loss of sensation or muscle weakness. She reports no foreign bodies present. The symptoms are aggravated by movement and weight bearing. She has tried non-weight bearing, NSAIDs and heat for the symptoms. The treatment provided mild relief.     Past Medical History:  Diagnosis Date  . Anxiety   . Arthritis   . Bipolar 1 disorder ([place])   . Depression   . Osteoarthritis    Past Surgical History:  Procedure Laterality Date  . ANTERIOR CRUCIATE LIGAMENT (ACL) REVISION Right   . CARPAL TUNNEL RELEASE    . COLONOSCOPY    . COLONOSCOPY WITH PROPOFOL N/A 11/17/2017   Procedure: COLONOSCOPY WITH PROPOFOL;  Surgeon: Virgel Manifold, MD;  Location: [facility] ENDOSCOPY;  Service: Endoscopy;  Laterality: N/A;  . DILATION AND CURETTAGE OF UTERUS  2013   for heavy bleeding  . ENDOMETRIAL ABLATION  2013  . TUBAL LIGATION    . WRIST ARTHROSCOPY Left 1990   for degenerative changes   Social History   Tobacco Use  . Smoking status: Current Every Day Smoker    Packs/day: 0.50    Years: 45.00    Pack years: 22.50     Types: Cigarettes  . Smokeless tobacco: Never Used  . Tobacco comment: started smoking at age 54  Vaping Use  . Vaping Use: Former  Substance Use Topics  . Alcohol use: Yes    Comment: 1-2 times per month  . Drug use: Not Currently    Comment: former crack cocaine user   Family History  Problem Relation Age of Onset  . Diabetes Mother   . Depression Mother   . Hypertension Mother   . Crohn's disease Mother   . Lung cancer Mother 77       former smoker  . Congestive Heart Failure Father   . Breast cancer Sister 30  . Alcohol abuse Sister   . Drug abuse Sister   . Anxiety disorder Sister   . Depression Sister   . Bipolar disorder Sister   . Alcohol abuse Brother   . Drug abuse Brother   . Anxiety disorder Brother   . Depression Brother   . Colon polyps Brother   . HIV Brother   . Alcohol abuse Brother   . Drug abuse Brother   . Anxiety disorder Brother   . Depression Brother   . Colon polyps Brother   . HIV Brother   . Skin cancer Maternal Grandfather   . Colon cancer Neg Hx   . Ovarian cancer Neg Hx   . Cervical cancer Neg  Hx    Allergies  Allergen Reactions  . Pregabalin Other (See Comments)    Other reaction(s): Other (See Comments) Suicidal ideation Other reaction(s): Other (See Comments) Suicidal ideation       Medications: Outpatient Medications Prior to Visit  Medication Sig  . amLODipine (NORVASC) 10 MG tablet TAKE 1 TABLET BY MOUTH EVERY DAY  . KLOR-CON M20 20 MEQ tablet TAKE 1 TABLET BY MOUTH EVERY DAY  . lamoTRIgine (LAMICTAL) 150 MG tablet TAKE 1 TABLET BY MOUTH EVERY DAY  . meloxicam (MOBIC) 15 MG tablet TAKE 1 TABLET BY MOUTH EVERY DAY  . methocarbamol (ROBAXIN) 500 MG tablet Take 500 mg by mouth 2 (two) times daily.  . rosuvastatin (CRESTOR) 5 MG tablet Take 1 tablet (5 mg total) by mouth every other day.  . triamterene-hydrochlorothiazide (MAXZIDE-25) 37.5-25 MG tablet TAKE 1 TABLET BY MOUTH EVERY DAY   No facility-administered  medications prior to visit.    Review of Systems  Neurological: Positive for tingling and numbness.       Objective    BP 121/85 (BP Location: Left Arm, Patient Position: Sitting, Cuff Size: Normal)   Pulse 94   Temp 98.6 F (37 C) (Oral)   Wt 175 lb 12.8 oz (79.7 kg)   LMP 12/07/2011   SpO2 98%   BMI 31.14 kg/m     Physical Exam Constitutional:      Appearance: Normal appearance.  Musculoskeletal:     Comments: Pain in the left groin with certain movements of the hip. No locking. Occasionally some numbness/.tingling down with history of piriformis syndrome.  Skin:    General: Skin is warm and dry.  Neurological:     General: No focal deficit present.     Mental Status: She is alert and oriented to person, place, and time.  Psychiatric:        Mood and Affect: Mood normal.        Behavior: Behavior normal.     No results found for any visits on 07/02/20.  Assessment & Plan     1. Left hip pain Onset of left anterior hip pain in the past week without known injury. Pain is sharp at times with certain movement. May continue NSAID and stretching exercises. May need PT and/or orthopedic referral. - DG Hip Unilat W OR W/O Pelvis 2-3 Views Left  2. History of arthritis History of arthritis treated with Ibuprofen or Meloxicam.   No follow-ups on file.      I, Patra Gherardi, PA-C, have reviewed all documentation for this visit. The documentation on 07/02/20 for the exam, diagnosis, procedures, and orders are all accurate and complete.    Vernie Murders, PA-C  [employer] [phone] (phone) [phone] (fax)  [place]

## 2020-07-02 ENCOUNTER — Ambulatory Visit: Payer: Medicaid Other | Admitting: Family Medicine

## 2020-07-02 ENCOUNTER — Other Ambulatory Visit: Payer: Self-pay

## 2020-07-02 ENCOUNTER — Encounter: Payer: Self-pay | Admitting: Family Medicine

## 2020-07-02 ENCOUNTER — Ambulatory Visit
Admission: RE | Admit: 2020-07-02 | Discharge: 2020-07-02 | Disposition: A | Discharge status: Home / Self Care | Payer: Medicaid Other | Source: Ambulatory Visit | Attending: Family Medicine | Admitting: Family Medicine

## 2020-07-02 ENCOUNTER — Ambulatory Visit
Admission: RE | Admit: 2020-07-02 | Discharge: 2020-07-02 | Disposition: A | Discharge status: Home / Self Care | Payer: Medicaid Other | Attending: Family Medicine | Admitting: Family Medicine

## 2020-07-02 VITALS — BP 121/85 | HR 93 | Temp 98.6°F | Wt 175.8 lb

## 2020-07-02 DIAGNOSIS — Z8739 Personal history of other diseases of the musculoskeletal system and connective tissue: Secondary | ICD-10-CM | POA: Diagnosis not present

## 2020-07-02 DIAGNOSIS — M25552 Pain in left hip: Secondary | ICD-10-CM | POA: Diagnosis not present

## 2020-07-03 ENCOUNTER — Encounter: Payer: Self-pay | Admitting: Family Medicine

## 2020-08-08 ENCOUNTER — Other Ambulatory Visit: Payer: Self-pay | Admitting: Family Medicine

## 2020-08-30 DIAGNOSIS — M1711 Unilateral primary osteoarthritis, right knee: Secondary | ICD-10-CM | POA: Diagnosis not present

## 2020-09-15 NOTE — Discharge Instructions (Signed)
Instructions after Total Knee Replacement   Alexa Newman, Jr., M.D.     Dept. of Orthopaedics & Sports Medicine  [facility]  [address]  [place], [place]  [address]  Phone: [phone]   Fax: [phone]    DIET: Drink plenty of non-alcoholic fluids. Resume your normal diet. Include foods high in fiber.  ACTIVITY:  You may use crutches or a walker with weight-bearing as tolerated, unless instructed otherwise. You may be weaned off of the walker or crutches by your Physical Therapist.  Do NOT place pillows under the knee. Anything placed under the knee could limit your ability to straighten the knee.   Continue doing gentle exercises. Exercising will reduce the pain and swelling, increase motion, and prevent muscle weakness.   Please continue to use the TED compression stockings for 6 weeks. You may remove the stockings at night, but should reapply them in the morning. Do not drive or operate any equipment until instructed.  WOUND CARE:  Continue to use the PolarCare or ice packs periodically to reduce pain and swelling. You may bathe or shower after the staples are removed at the first office visit following surgery.  MEDICATIONS: You may resume your regular medications. Please take the pain medication as prescribed on the medication. Do not take pain medication on an empty stomach. You have been given a prescription for a blood thinner (Lovenox or Coumadin). Please take the medication as instructed. (NOTE: After completing a 2 week course of Lovenox, take one Enteric-coated aspirin once a day. This along with elevation will help reduce the possibility of phlebitis in your operated leg.) Do not drive or drink alcoholic beverages when taking pain medications.  CALL THE OFFICE FOR: Temperature above 101 degrees Excessive bleeding or drainage on the dressing. Excessive swelling, coldness, or paleness of the toes. Persistent nausea and vomiting.  FOLLOW-UP:  You  should have an appointment to return to the office in 10-14 days after surgery. Arrangements have been made for continuation of Physical Therapy (either home therapy or outpatient therapy).   [facility] Department Directory         [email or web address]       [email or web address]          Cardiology  Appointments: [place] - [phone] [place] - [phone]  Endocrinology  Appointments: [place] - [phone] [place] - [phone]  Gastroenterology  Appointments: [place] - [phone] [place] - [phone]        General Surgery   Appointments: [place] - [phone]  Internal Medicine/Family Medicine  Appointments: [place] - [phone] [place] - [phone] [place] - [phone]  Metabolic and Weigh Loss Surgery  Appointments: [place] - [phone]        Neurology  Appointments: [place] - [phone] [place] - [phone]  Neurosurgery  Appointments: [place] - [phone]  Obstetrics & Gynecology  Appointments: [place] - [phone] [place] - [phone]        Pediatrics  Appointments: [place] - [phone] [place] - [phone]  Physiatry  Appointments: [place] -[phone]  Physical Therapy  Appointments: [place] - [phone] [place] - [phone]        Podiatry  Appointments: [place] - [phone] [place] - [phone]  Pulmonology  Appointments: [place] - [phone]  Rheumatology  Appointments: [place] - [phone]        [place] Location: [facility]  [address] [place], [place]  [address]  [place] Location: [facility] [address] [place], [place]  [address]  [place] Location: [facility] [address] [place], [place]  [address]    

## 2020-09-16 ENCOUNTER — Other Ambulatory Visit: Payer: Self-pay

## 2020-09-16 ENCOUNTER — Telehealth (INDEPENDENT_AMBULATORY_CARE_PROVIDER_SITE_OTHER): Payer: Medicaid Other | Admitting: Psychiatry

## 2020-09-16 ENCOUNTER — Encounter: Payer: Self-pay | Admitting: Psychiatry

## 2020-09-16 DIAGNOSIS — F1421 Cocaine dependence, in remission: Secondary | ICD-10-CM

## 2020-09-16 DIAGNOSIS — F3176 Bipolar disorder, in full remission, most recent episode depressed: Secondary | ICD-10-CM | POA: Diagnosis not present

## 2020-09-16 DIAGNOSIS — F5105 Insomnia due to other mental disorder: Secondary | ICD-10-CM

## 2020-09-16 DIAGNOSIS — Z8659 Personal history of other mental and behavioral disorders: Secondary | ICD-10-CM

## 2020-09-16 DIAGNOSIS — F17200 Nicotine dependence, unspecified, uncomplicated: Secondary | ICD-10-CM | POA: Diagnosis not present

## 2020-09-16 DIAGNOSIS — F411 Generalized anxiety disorder: Secondary | ICD-10-CM

## 2020-09-16 MED ORDER — LAMOTRIGINE 150 MG PO TABS: 150.0000 mg | ORAL_TABLET | Freq: Every day | ORAL | 1 refills | Status: DC
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
## 2020-09-24 ENCOUNTER — Encounter
Admission: RE | Admit: 2020-09-24 | Discharge: 2020-09-24 | Disposition: A | Discharge status: Home / Self Care | Payer: Medicaid Other | Source: Ambulatory Visit | Attending: Orthopedic Surgery | Admitting: Orthopedic Surgery

## 2020-09-24 ENCOUNTER — Other Ambulatory Visit: Payer: Self-pay

## 2020-09-24 DIAGNOSIS — Z01810 Encounter for preprocedural cardiovascular examination: Secondary | ICD-10-CM | POA: Diagnosis not present

## 2020-09-24 DIAGNOSIS — Z01818 Encounter for other preprocedural examination: Secondary | ICD-10-CM | POA: Insufficient documentation

## 2020-09-24 DIAGNOSIS — M1711 Unilateral primary osteoarthritis, right knee: Secondary | ICD-10-CM | POA: Diagnosis not present

## 2020-09-24 HISTORY — DX: Prediabetes: R73.03

## 2020-09-24 HISTORY — DX: Essential (primary) hypertension: I10

## 2020-09-24 HISTORY — DX: Nausea with vomiting, unspecified: R11.2

## 2020-09-24 HISTORY — DX: Family history of other specified conditions: Z84.89

## 2020-09-24 HISTORY — DX: Other specified postprocedural states: Z98.890

## 2020-09-24 LAB — TYPE AND SCREEN
ABO/RH(D): O NEG
Antibody Screen: NEGATIVE

## 2020-09-24 LAB — URINALYSIS, ROUTINE W REFLEX MICROSCOPIC
Bilirubin Urine: NEGATIVE
Glucose, UA: NEGATIVE mg/dL
Ketones, ur: NEGATIVE mg/dL
Leukocytes,Ua: NEGATIVE
Nitrite: POSITIVE — AB
Protein, ur: NEGATIVE mg/dL
Specific Gravity, Urine: 1.016 (ref 1.005–1.030)
pH: 7 (ref 5.0–8.0)

## 2020-09-24 LAB — COMPREHENSIVE METABOLIC PANEL
ALT: 15 U/L (ref 0–44)
AST: 17 U/L (ref 15–41)
Albumin: 4 g/dL (ref 3.5–5.0)
Alkaline Phosphatase: 80 U/L (ref 38–126)
Anion gap: 7 (ref 5–15)
BUN: 18 mg/dL (ref 6–20)
CO2: 27 mmol/L (ref 22–32)
Calcium: 9.5 mg/dL (ref 8.9–10.3)
Chloride: 104 mmol/L (ref 98–111)
Creatinine, Ser: 0.77 mg/dL (ref 0.44–1.00)
GFR, Estimated: >60 mL/min (ref >60)
Glucose, Bld: 106 mg/dL — ABNORMAL HIGH (ref 70–99)
Potassium: 3.3 mmol/L — ABNORMAL LOW (ref 3.5–5.1)
Sodium: 138 mmol/L (ref 135–145)
Total Bilirubin: 0.6 mg/dL (ref 0.3–1.2)
Total Protein: 7.3 g/dL (ref 6.5–8.1)

## 2020-09-24 LAB — CBC
HCT: 40.3 % (ref 36.0–46.0)
Hemoglobin: 13 g/dL (ref 12.0–15.0)
MCH: 26.5 pg (ref 26.0–34.0)
MCHC: 32.3 g/dL (ref 30.0–36.0)
MCV: 82.2 fL (ref 80.0–100.0)
Platelets: 259 10*3/uL (ref 150–400)
RBC: 4.9 MIL/uL (ref 3.87–5.11)
RDW: 13.5 % (ref 11.5–15.5)
WBC: 8.3 10*3/uL (ref 4.0–10.5)
nRBC: 0 % (ref 0.0–0.2)

## 2020-09-24 LAB — SURGICAL PCR SCREEN
MRSA, PCR: NEGATIVE
Staphylococcus aureus: POSITIVE — AB

## 2020-09-24 LAB — SEDIMENTATION RATE: Sed Rate: 11 mm/hr (ref 0–30)

## 2020-09-24 LAB — APTT: aPTT: 30 seconds (ref 24–36)

## 2020-09-24 LAB — PROTIME-INR
INR: 1 (ref 0.8–1.2)
Prothrombin Time: 13.2 seconds (ref 11.4–15.2)

## 2020-09-24 LAB — C-REACTIVE PROTEIN: CRP: 0.6 mg/dL (ref <1.0)

## 2020-09-24 NOTE — Patient Instructions (Addendum)
Your procedure is scheduled on: 10/02/20 - Wednesday Report to the Registration Desk on the 1st floor of the [facility]. To find out your arrival time, please call [phone] between 1PM - 3PM on: 10/01/20 - Tuesday Covid Testing - [facility] on 09/30/20 between 8 am - 1200.  REMEMBER: Instructions that are not followed completely may result in serious medical risk, up to and including death; or upon the discretion of your surgeon and anesthesiologist your surgery may need to be rescheduled.  Do not eat food after midnight the night before surgery.  No gum chewing, lozengers or hard candies.  You may however, drink CLEAR liquids up to 2 hours before you are scheduled to arrive for your surgery. Do not drink anything within 2 hours of your scheduled arrival time.  Clear liquids include: - water  - apple juice without pulp - gatorade (not RED, PURPLE, OR BLUE) - black coffee or tea (Do NOT add milk or creamers to the coffee or tea) Do NOT drink anything that is not on this list.  Type 1 and Type 2 diabetics should only drink water.  In addition, your doctor has ordered for you to drink the provided  Ensure Pre-Surgery Clear Carbohydrate Drink  Drinking this carbohydrate drink up to two hours before surgery helps to reduce insulin resistance and improve patient outcomes. Please complete drinking 2 hours prior to scheduled arrival time.  TAKE THESE MEDICATIONS THE MORNING OF SURGERY WITH A SIP OF WATER:  - amLODipine (NORVASC) 10 MG tablet - lamoTRIgine (LAMICTAL) 150 MG tablet - methocarbamol (ROBAXIN) 500 MG tablet   One week prior to surgery: Stop taking beginning 09/24/20 : meloxicam (MOBIC) 15 MG tablet,   Stop Anti-inflammatories (NSAIDS) such as Advil, Aleve, Ibuprofen, Motrin, Naproxen, Naprosyn and Aspirin based products such as Excedrin, Goodys Powder, BC Powder beginning 09/24/20.  Stop ANY OVER THE COUNTER supplements until after surgery beginning 09/24/20  Ubiquinol 100 MG .  You may take Tylenol as directed  if needed for pain up until the day of surgery.  No Alcohol for 24 hours before or after surgery.  No Smoking including e-cigarettes for 24 hours prior to surgery.  No chewable tobacco products for at least 6 hours prior to surgery.  No nicotine patches on the day of surgery.  Do not use any "recreational" drugs for at least a week prior to your surgery.  Please be advised that the combination of cocaine and anesthesia may have negative outcomes, up to and including death. If you test positive for cocaine, your surgery will be cancelled.  On the morning of surgery brush your teeth with toothpaste and water, you may rinse your mouth with mouthwash if you wish. Do not swallow any toothpaste or mouthwash.  Do not wear jewelry, make-up, hairpins, clips or nail polish.  Do not wear lotions, powders, or perfumes.   Do not shave body from the neck down 48 hours prior to surgery just in case you cut yourself which could leave a site for infection.  Also, freshly shaved skin may become irritated if using the CHG soap.  Contact lenses, hearing aids and dentures may not be worn into surgery.  Do not bring valuables to the hospital. [facility] is not responsible for any missing/lost belongings or valuables.   Use CHG Soap or wipes as directed on instruction sheet.  Notify your doctor if there is any change in your medical condition (cold, fever, infection).  Wear comfortable clothing (specific to your surgery  type) to the hospital.  After surgery, you can help prevent lung complications by doing breathing exercises.  Take deep breaths and cough every 1-2 hours. Your doctor may order a device called an Incentive Spirometer to help you take deep breaths. When coughing or sneezing, hold a pillow firmly against your incision with both hands. This is called "splinting." Doing this helps protect your incision. It also decreases belly  discomfort.  If you are being admitted to the hospital overnight, leave your suitcase in the car. After surgery it may be brought to your room.  If you are being discharged the day of surgery, you will not be allowed to drive home. You will need a responsible adult (18 years or older) to drive you home and stay with you that night.   If you are taking public transportation, you will need to have a responsible adult (18 years or older) with you. Please confirm with your physician that it is acceptable to use public transportation.   Please call the [facility] Dept. at [phone] if you have any questions about these instructions.  Surgery Visitation Policy:  Patients undergoing a surgery or procedure may have one family member or support person with them as long as that person is not COVID-19 positive or experiencing its symptoms.  That person may remain in the waiting area during the procedure.  Inpatient Visitation:    Visiting hours are 7 a.m. to 8 p.m. Inpatients will be allowed two visitors daily. The visitors may change each day during the patient's stay. No visitors under the age of 53. Any visitor under the age of 57 must be accompanied by an adult. The visitor must pass COVID-19 screenings, use hand sanitizer when entering and exiting the patient's room and wear a mask at all times, including in the patient's room. Patients must also wear a mask when staff or their visitor are in the room. Masking is required regardless of vaccination status.

## 2020-09-26 LAB — URINE CULTURE: Culture: >=100000 — AB

## 2020-09-30 ENCOUNTER — Other Ambulatory Visit: Payer: Self-pay

## 2020-09-30 ENCOUNTER — Other Ambulatory Visit
Admission: RE | Admit: 2020-09-30 | Discharge: 2020-09-30 | Disposition: A | Discharge status: Home / Self Care | Payer: Medicaid Other | Source: Ambulatory Visit | Attending: Orthopedic Surgery | Admitting: Orthopedic Surgery

## 2020-09-30 DIAGNOSIS — Z20822 Contact with and (suspected) exposure to covid-19: Secondary | ICD-10-CM | POA: Diagnosis not present

## 2020-09-30 DIAGNOSIS — Z01812 Encounter for preprocedural laboratory examination: Secondary | ICD-10-CM | POA: Diagnosis not present

## 2020-09-30 LAB — SARS CORONAVIRUS 2 (TAT 6-24 HRS): SARS Coronavirus 2: NEGATIVE

## 2020-10-01 NOTE — H&P (Signed)
ORTHOPAEDIC HISTORY & PHYSICAL Alexa Newman, [place] - 09/24/2020 8:45 AM EDT Formatting of this note is different from the original. Alexa Newman MEDICINE Chief Complaint:   Chief Complaint  Patient presents with   Knee Pain  H & P RIGHT KNEE   History of Present Illness:   Alexa Newman is a 60 y.o. female that presents to clinic today for her preoperative history and evaluation. Patient presents unaccompanied. The patient is scheduled to undergo a right total knee arthroplasty on 10/02/20 by Dr. Marry Guan. Her pain began several years ago. The pain is located primarily along the medial aspect of the knee. She describes her pain as worse with weightbearing and going up or down stairs. She reports associated swelling with some giving way of the knee. She denies associated numbness or tingling, denies locking.   The patient's symptoms have progressed to the point that they decrease her quality of life. The patient has previously undergone conservative treatment including NSAIDS and injections to the knee without adequate control of her symptoms.  Denies history of blood clots, significant cardiac history, or history of lumbar surgery.  Patient is not diabetic. Of note, patient has previously undergone a right ACL reconstruction.  Past Medical, Surgical, Family, Social History, Allergies, Medications:   Past Medical History:  Past Medical History:  Diagnosis Date   Anxiety   Arthritis   Bipolar 1 disorder (CMS-HCC)   Cocaine use disorder, moderate, in sustained remission (CMS-HCC)   Depression   Past Surgical History:  Past Surgical History:  Procedure Laterality Date   ANTERIOR CRUCIATE LIGAMENT REPAIR   Arthroscopic surgery right wrist 1991   DILATION AND CURETTAGE, DIAGNOSTIC / THERAPEUTIC 2013   ENDOMETRIAL ABLATION 06/2011  [facility]   NEUROPLASTY &/OR TRANSPOSITION MEDIAN NERVE AT CARPAL TUNNEL Right 2019  [facility]   proximal row carpectomy  Right 2019  [facility]   Repair of right index finger, crashed bones   TUBAL LIGATION 1990   Current Medications:  Current Outpatient Medications  Medication Sig Dispense Refill   amLODIPine (NORVASC) 10 MG tablet 10 mg once daily   coQ10, ubiquinol, 100 mg Cap Take 100 mg by mouth once daily   KLOR-CON M20 20 mEq ER tablet 20 mEq once daily   lamoTRIgine (LAMICTAL) 150 MG tablet Take by mouth Take 1 tablet (150 mg total) by mouth daily.   meloxicam (MOBIC) 15 MG tablet Take 1 tablet (15 mg total) by mouth once daily 30 tablet 1   methocarbamoL (ROBAXIN) 500 MG tablet Take 500 mg by mouth 2 (two) times daily   rosuvastatin (CRESTOR) 5 MG tablet 5 mg every other day   triamterene-hydrochlorothiazide (MAXZIDE-25) 37.5-25 mg tablet Take 1 tablet by mouth once daily   No current facility-administered medications for this visit.   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 Has two or three caffeinated beverages daily. No guns. Exercises regularly. Always wears seatbelts. She is working as a Animal nutritionist. She has some college education, and is attending college now. Learns best by showing. Sexually active with  multiple partners, men and women. Uses condoms.   Family History:  Family History  Problem Relation Age of Onset   High blood pressure (Hypertension) Mother   Diabetes insipidus Mother   Osteoporosis (Thinning of bones) Mother   Depression Mother   Crohn's disease Mother   Lung cancer Mother   Crohn's disease Sister  ?   Breast cancer Sister   High blood pressure (Hypertension) Father   Heart disease Father   Stroke Father   Alcohol abuse Father   Heart failure Father   Review of Systems:   A 10+ ROS was performed, reviewed, and the pertinent orthopaedic findings are documented in the HPI.   Physical Examination:   BP (!) 140/84 (BP Location: Left upper arm, Patient Position: Sitting, BP Cuff Size: Adult)  Ht 162.6 cm ('5\' 4"'$ )  Wt 78.2 kg (172 lb 6.4 oz)  LMP 06/17/2011  BMI 29.59 kg/m   Patient is a well-developed, well-nourished female in no acute distress. Patient has normal mood and affect. Patient is alert and oriented to person, place, and time.   HEENT: Atraumatic, normocephalic. Pupils equal and reactive to light. Extraocular motion intact. Noninjected sclera.  Cardiovascular: Regular rate and rhythm, with no murmurs, rubs, or gallops. Distal pulses palpable.  Respiratory: Lungs clear to auscultation bilaterally.   Right Knee: Soft tissue swelling: mild Effusion: none Erythema: none Crepitance: mild Tenderness: medial, anterior Alignment: relative varus Mediolateral laxity: medial pseudolaxity Posterior sag: negative Patellar tracking: Good tracking without evidence of subluxation or tilt. Patellar grind test is positive. Atrophy: No significant atrophy.  Quadriceps tone was fair to good. Range of motion: 0/10/130 degrees There is a well healed incision consistent with the patient's history of ACL reconstruction.  Sensation intact over the saphenous, lateral sural cutaneous, superficial fibular, and deep fibular nerve distributions.  Tests  Performed/Reviewed:  X-rays  X-ray knee right 3 views  Result Date: 09/24/2020 3 views of the right knee were obtained. Images reveal moderate to severe loss of medial compartment joint space with osteophyte formation. Mild loss of lateral compartment joint space with osteophyte formation also noted. No fractures or dislocations noted. No other osseous abnormality noted. Retained surgical staple in the medial tibial metaphysis.  I personally ordered and interpreted today's x-rays.  Impression:   ICD-10-CM  1. Primary osteoarthritis of right knee M17.11   Plan:   The patient has end-stage degenerative changes of the right knee. It was explained to the patient that the condition is progressive in nature. Having failed conservative treatment, the patient has elected to proceed with a total joint arthroplasty. The patient will undergo a total joint arthroplasty with Dr. Marry Guan. The risks of surgery, including blood clot and infection, were discussed with the patient. Measures to reduce these risks, including the use of anticoagulation, perioperative antibiotics, and early ambulation were discussed. The importance of postoperative physical therapy was discussed with the patient. The patient elects to proceed with surgery. The patient is instructed to stop all blood thinners prior to surgery. The patient is instructed to call the hospital the day before surgery to learn of the proper arrival time.   Contact our office with any  questions or concerns. Follow up as indicated, or sooner should any new problems arise, if conditions worsen, or if they are otherwise concerned.   Alexa Newman, [facility] [place], [place] [address] Phone: [phone]  This note was generated in part with voice recognition software and I apologize for any typographical errors that were not detected and corrected.  Electronically signed by Alexa Fudge, PA at 09/24/2020 6:23 PM EDT

## 2020-10-02 ENCOUNTER — Ambulatory Visit: Payer: Medicaid Other | Admitting: Certified Registered Nurse Anesthetist

## 2020-10-02 ENCOUNTER — Observation Stay
Admission: RE | Admit: 2020-10-02 | Discharge: 2020-10-05 | Disposition: A | Discharge status: Home / Self Care | Payer: Medicaid Other | Attending: Orthopedic Surgery | Admitting: Orthopedic Surgery

## 2020-10-02 ENCOUNTER — Encounter
Admission: RE | Disposition: A | Discharge status: Home / Self Care | Payer: Self-pay | Source: Home / Self Care | Attending: Orthopedic Surgery

## 2020-10-02 ENCOUNTER — Observation Stay: Payer: Medicaid Other

## 2020-10-02 ENCOUNTER — Other Ambulatory Visit: Payer: Self-pay

## 2020-10-02 ENCOUNTER — Encounter: Payer: Self-pay | Admitting: Orthopedic Surgery

## 2020-10-02 DIAGNOSIS — Z79899 Other long term (current) drug therapy: Secondary | ICD-10-CM | POA: Insufficient documentation

## 2020-10-02 DIAGNOSIS — Z471 Aftercare following joint replacement surgery: Secondary | ICD-10-CM | POA: Diagnosis not present

## 2020-10-02 DIAGNOSIS — T8182XA Emphysema (subcutaneous) resulting from a procedure, initial encounter: Secondary | ICD-10-CM | POA: Diagnosis not present

## 2020-10-02 DIAGNOSIS — Z96651 Presence of right artificial knee joint: Secondary | ICD-10-CM

## 2020-10-02 DIAGNOSIS — E876 Hypokalemia: Secondary | ICD-10-CM | POA: Diagnosis not present

## 2020-10-02 DIAGNOSIS — R7303 Prediabetes: Secondary | ICD-10-CM | POA: Insufficient documentation

## 2020-10-02 DIAGNOSIS — I10 Essential (primary) hypertension: Secondary | ICD-10-CM | POA: Insufficient documentation

## 2020-10-02 DIAGNOSIS — T84196A Other mechanical complication of internal fixation device of bone of right lower leg, initial encounter: Secondary | ICD-10-CM | POA: Diagnosis not present

## 2020-10-02 DIAGNOSIS — T8489XA Other specified complication of internal orthopedic prosthetic devices, implants and grafts, initial encounter: Secondary | ICD-10-CM | POA: Diagnosis not present

## 2020-10-02 DIAGNOSIS — Y813 Surgical instruments, materials and general- and plastic-surgery devices (including sutures) associated with adverse incidents: Secondary | ICD-10-CM | POA: Diagnosis not present

## 2020-10-02 DIAGNOSIS — M1711 Unilateral primary osteoarthritis, right knee: Secondary | ICD-10-CM | POA: Diagnosis not present

## 2020-10-02 DIAGNOSIS — F17210 Nicotine dependence, cigarettes, uncomplicated: Secondary | ICD-10-CM | POA: Diagnosis not present

## 2020-10-02 DIAGNOSIS — Z96659 Presence of unspecified artificial knee joint: Secondary | ICD-10-CM

## 2020-10-02 HISTORY — PX: KNEE ARTHROPLASTY: SHX992

## 2020-10-02 LAB — ABO/RH: ABO/RH(D): O NEG

## 2020-10-02 SURGERY — ARTHROPLASTY, KNEE, TOTAL, USING IMAGELESS COMPUTER-ASSISTED NAVIGATION
Anesthesia: Spinal | Site: Knee | Laterality: Right

## 2020-10-02 MED ORDER — OXYCODONE HCL 5 MG PO TABS
5.0000 mg | ORAL_TABLET | ORAL | Status: DC | PRN
  Administered 2020-10-05: 5 mg via ORAL
  Filled 2020-10-02: qty 1

## 2020-10-02 MED ORDER — MENTHOL 3 MG MT LOZG
1.0000 | LOZENGE | OROMUCOSAL | Status: DC | PRN
  Filled 2020-10-02: qty 9

## 2020-10-02 MED ORDER — FAMOTIDINE 20 MG PO TABS: 20.0000 mg | ORAL_TABLET | Freq: Once | ORAL | Status: AC

## 2020-10-02 MED ORDER — ENSURE PRE-SURGERY PO LIQD
296.0000 mL | Freq: Once | ORAL | Status: AC
  Administered 2020-10-02: 296 mL via ORAL
  Filled 2020-10-02: qty 296

## 2020-10-02 MED ORDER — CELECOXIB 200 MG PO CAPS
200.0000 mg | ORAL_CAPSULE | Freq: Two times a day (BID) | ORAL | Status: DC
  Administered 2020-10-02 – 2020-10-05 (×6): 200 mg via ORAL
  Filled 2020-10-02 (×6): qty 1

## 2020-10-02 MED ORDER — CEFAZOLIN SODIUM-DEXTROSE 2-4 GM/100ML-% IV SOLN
INTRAVENOUS | Status: AC
  Filled 2020-10-02: qty 100

## 2020-10-02 MED ORDER — BUPIVACAINE HCL (PF) 0.25 % IJ SOLN
INTRAMUSCULAR | Status: DC | PRN
  Administered 2020-10-02: 30 mL

## 2020-10-02 MED ORDER — METHOCARBAMOL 500 MG PO TABS
500.0000 mg | ORAL_TABLET | Freq: Two times a day (BID) | ORAL | Status: DC
  Administered 2020-10-02 – 2020-10-05 (×6): 500 mg via ORAL
  Filled 2020-10-02 (×6): qty 1

## 2020-10-02 MED ORDER — FAMOTIDINE 20 MG PO TABS
ORAL_TABLET | ORAL | Status: AC
  Administered 2020-10-02: 20 mg via ORAL
  Filled 2020-10-02: qty 1

## 2020-10-02 MED ORDER — PANTOPRAZOLE SODIUM 40 MG PO TBEC
40.0000 mg | DELAYED_RELEASE_TABLET | Freq: Two times a day (BID) | ORAL | Status: DC
  Administered 2020-10-02 – 2020-10-05 (×6): 40 mg via ORAL
  Filled 2020-10-02 (×6): qty 1

## 2020-10-02 MED ORDER — ACETAMINOPHEN 325 MG PO TABS
325.0000 mg | ORAL_TABLET | Freq: Four times a day (QID) | ORAL | Status: DC | PRN
Start: 2020-10-03 — End: 2020-10-05

## 2020-10-02 MED ORDER — ONDANSETRON HCL 4 MG/2ML IJ SOLN
INTRAMUSCULAR | Status: AC
  Filled 2020-10-02: qty 2

## 2020-10-02 MED ORDER — MIDAZOLAM HCL 2 MG/2ML IJ SOLN
INTRAMUSCULAR | Status: AC
  Filled 2020-10-02: qty 2

## 2020-10-02 MED ORDER — TRANEXAMIC ACID-NACL 1000-0.7 MG/100ML-% IV SOLN
1000.0000 mg | INTRAVENOUS | Status: AC
  Administered 2020-10-02: 1000 mg via INTRAVENOUS

## 2020-10-02 MED ORDER — ACETAMINOPHEN 10 MG/ML IV SOLN
INTRAVENOUS | Status: DC | PRN
  Administered 2020-10-02: 1000 mg via INTRAVENOUS

## 2020-10-02 MED ORDER — ONDANSETRON HCL 4 MG PO TABS: 4.0000 mg | ORAL_TABLET | Freq: Four times a day (QID) | ORAL | Status: DC | PRN

## 2020-10-02 MED ORDER — CHLORHEXIDINE GLUCONATE 4 % EX LIQD: 60.0000 mL | Freq: Once | CUTANEOUS | Status: DC

## 2020-10-02 MED ORDER — PROPOFOL 10 MG/ML IV BOLUS
INTRAVENOUS | Status: AC
  Filled 2020-10-02: qty 20

## 2020-10-02 MED ORDER — ORAL CARE MOUTH RINSE: 15.0000 mL | Freq: Once | OROMUCOSAL | Status: AC

## 2020-10-02 MED ORDER — SODIUM CHLORIDE 0.9 % IV SOLN
INTRAVENOUS | Status: DC | PRN
  Administered 2020-10-02: 30 ug/min via INTRAVENOUS

## 2020-10-02 MED ORDER — OXYCODONE HCL 5 MG PO TABS
10.0000 mg | ORAL_TABLET | ORAL | Status: DC | PRN
  Administered 2020-10-02 – 2020-10-05 (×10): 10 mg via ORAL
  Filled 2020-10-02 (×10): qty 2

## 2020-10-02 MED ORDER — DEXAMETHASONE SODIUM PHOSPHATE 10 MG/ML IJ SOLN: 8.0000 mg | Freq: Once | INTRAMUSCULAR | Status: AC

## 2020-10-02 MED ORDER — GABAPENTIN 300 MG PO CAPS
ORAL_CAPSULE | ORAL | Status: AC
  Administered 2020-10-02: 300 mg via ORAL
  Filled 2020-10-02: qty 1

## 2020-10-02 MED ORDER — CELECOXIB 200 MG PO CAPS: 400.0000 mg | ORAL_CAPSULE | Freq: Once | ORAL | Status: AC

## 2020-10-02 MED ORDER — ONDANSETRON HCL 4 MG/2ML IJ SOLN
INTRAMUSCULAR | Status: DC | PRN
  Administered 2020-10-02: 4 mg via INTRAVENOUS

## 2020-10-02 MED ORDER — TRANEXAMIC ACID-NACL 1000-0.7 MG/100ML-% IV SOLN
INTRAVENOUS | Status: AC
  Administered 2020-10-02: 1000 mg via INTRAVENOUS
  Filled 2020-10-02: qty 100

## 2020-10-02 MED ORDER — MIDAZOLAM HCL 5 MG/5ML IJ SOLN
INTRAMUSCULAR | Status: DC | PRN
  Administered 2020-10-02: 2 mg via INTRAVENOUS

## 2020-10-02 MED ORDER — PHENOL 1.4 % MT LIQD
1.0000 | OROMUCOSAL | Status: DC | PRN
  Filled 2020-10-02: qty 177

## 2020-10-02 MED ORDER — FENTANYL CITRATE (PF) 100 MCG/2ML IJ SOLN: 25.0000 ug | INTRAMUSCULAR | Status: DC | PRN

## 2020-10-02 MED ORDER — METOCLOPRAMIDE HCL 10 MG PO TABS
10.0000 mg | ORAL_TABLET | Freq: Three times a day (TID) | ORAL | Status: AC
  Administered 2020-10-02 – 2020-10-04 (×8): 10 mg via ORAL
  Filled 2020-10-02 (×8): qty 1

## 2020-10-02 MED ORDER — FERROUS SULFATE 325 (65 FE) MG PO TABS
325.0000 mg | ORAL_TABLET | Freq: Two times a day (BID) | ORAL | Status: DC
  Administered 2020-10-02 – 2020-10-05 (×6): 325 mg via ORAL
  Filled 2020-10-02 (×6): qty 1

## 2020-10-02 MED ORDER — DEXAMETHASONE SODIUM PHOSPHATE 10 MG/ML IJ SOLN
INTRAMUSCULAR | Status: AC
  Administered 2020-10-02: 8 mg via INTRAVENOUS
  Filled 2020-10-02: qty 1

## 2020-10-02 MED ORDER — TRIAMTERENE-HCTZ 37.5-25 MG PO TABS
1.0000 | ORAL_TABLET | Freq: Every day | ORAL | Status: DC
  Administered 2020-10-02 – 2020-10-05 (×3): 1 via ORAL
  Filled 2020-10-02 (×5): qty 1

## 2020-10-02 MED ORDER — POTASSIUM CHLORIDE CRYS ER 20 MEQ PO TBCR
20.0000 meq | EXTENDED_RELEASE_TABLET | Freq: Every day | ORAL | Status: DC
  Administered 2020-10-02 – 2020-10-05 (×4): 20 meq via ORAL
  Filled 2020-10-02 (×4): qty 1

## 2020-10-02 MED ORDER — LAMOTRIGINE 25 MG PO TABS
150.0000 mg | ORAL_TABLET | Freq: Every day | ORAL | Status: DC
  Administered 2020-10-03 – 2020-10-05 (×3): 150 mg via ORAL
  Filled 2020-10-02: qty 2
  Filled 2020-10-02 (×3): qty 6

## 2020-10-02 MED ORDER — HYDROMORPHONE HCL 1 MG/ML IJ SOLN
0.5000 mg | INTRAMUSCULAR | Status: DC | PRN
  Administered 2020-10-02: 1 mg via INTRAVENOUS
  Filled 2020-10-02: qty 1

## 2020-10-02 MED ORDER — GABAPENTIN 300 MG PO CAPS
300.0000 mg | ORAL_CAPSULE | Freq: Once | ORAL | Status: AC
Start: 2020-10-02 — End: 2020-10-02

## 2020-10-02 MED ORDER — SODIUM CHLORIDE 0.9 % IV SOLN: INTRAVENOUS | Status: DC

## 2020-10-02 MED ORDER — FENTANYL CITRATE (PF) 100 MCG/2ML IJ SOLN
INTRAMUSCULAR | Status: DC | PRN
  Administered 2020-10-02 (×2): 25 ug via INTRAVENOUS
  Administered 2020-10-02: 50 ug via INTRAVENOUS

## 2020-10-02 MED ORDER — CEFAZOLIN SODIUM-DEXTROSE 2-4 GM/100ML-% IV SOLN
2.0000 g | Freq: Four times a day (QID) | INTRAVENOUS | Status: AC
  Administered 2020-10-02 (×2): 2 g via INTRAVENOUS
  Filled 2020-10-02 (×2): qty 100

## 2020-10-02 MED ORDER — APREPITANT 40 MG PO CAPS: 40.0000 mg | ORAL_CAPSULE | Freq: Once | ORAL | Status: AC

## 2020-10-02 MED ORDER — CEFAZOLIN SODIUM-DEXTROSE 2-4 GM/100ML-% IV SOLN
2.0000 g | INTRAVENOUS | Status: AC
Start: 2020-10-02 — End: 2020-10-02
  Administered 2020-10-02: 2 g via INTRAVENOUS

## 2020-10-02 MED ORDER — CHLORHEXIDINE GLUCONATE 0.12 % MT SOLN: 15.0000 mL | Freq: Once | OROMUCOSAL | Status: AC

## 2020-10-02 MED ORDER — PROPOFOL 500 MG/50ML IV EMUL
INTRAVENOUS | Status: DC | PRN
  Administered 2020-10-02: 75 ug/kg/min via INTRAVENOUS

## 2020-10-02 MED ORDER — ALUM & MAG HYDROXIDE-SIMETH 200-200-20 MG/5ML PO SUSP
30.0000 mL | ORAL | Status: DC | PRN
  Administered 2020-10-02: 30 mL via ORAL
  Filled 2020-10-02: qty 30

## 2020-10-02 MED ORDER — ACETAMINOPHEN 10 MG/ML IV SOLN
INTRAVENOUS | Status: AC
  Filled 2020-10-02: qty 100

## 2020-10-02 MED ORDER — DIPHENHYDRAMINE HCL 12.5 MG/5ML PO ELIX
12.5000 mg | ORAL_SOLUTION | ORAL | Status: DC | PRN
  Filled 2020-10-02: qty 10

## 2020-10-02 MED ORDER — TRAMADOL HCL 50 MG PO TABS: 50.0000 mg | ORAL_TABLET | ORAL | Status: DC | PRN

## 2020-10-02 MED ORDER — FENTANYL CITRATE (PF) 100 MCG/2ML IJ SOLN
INTRAMUSCULAR | Status: AC
  Filled 2020-10-02: qty 2

## 2020-10-02 MED ORDER — TRANEXAMIC ACID-NACL 1000-0.7 MG/100ML-% IV SOLN: 1000.0000 mg | Freq: Once | INTRAVENOUS | Status: AC

## 2020-10-02 MED ORDER — MAGNESIUM HYDROXIDE 400 MG/5ML PO SUSP
30.0000 mL | Freq: Every day | ORAL | Status: DC
  Administered 2020-10-03 – 2020-10-05 (×3): 30 mL via ORAL
  Filled 2020-10-02 (×3): qty 30

## 2020-10-02 MED ORDER — ACETAMINOPHEN 10 MG/ML IV SOLN
1000.0000 mg | Freq: Four times a day (QID) | INTRAVENOUS | Status: AC
  Administered 2020-10-02 – 2020-10-03 (×4): 1000 mg via INTRAVENOUS
  Filled 2020-10-02 (×4): qty 100

## 2020-10-02 MED ORDER — BUPIVACAINE HCL (PF) 0.5 % IJ SOLN
INTRAMUSCULAR | Status: DC | PRN
  Administered 2020-10-02: 3 mL via INTRATHECAL

## 2020-10-02 MED ORDER — CHLORHEXIDINE GLUCONATE 0.12 % MT SOLN
OROMUCOSAL | Status: AC
  Administered 2020-10-02: 15 mL via OROMUCOSAL
  Filled 2020-10-02: qty 15

## 2020-10-02 MED ORDER — ONDANSETRON HCL 4 MG/2ML IJ SOLN: 4.0000 mg | Freq: Four times a day (QID) | INTRAMUSCULAR | Status: DC | PRN

## 2020-10-02 MED ORDER — ENOXAPARIN SODIUM 30 MG/0.3ML IJ SOSY
30.0000 mg | PREFILLED_SYRINGE | Freq: Two times a day (BID) | INTRAMUSCULAR | Status: DC
  Administered 2020-10-03 – 2020-10-05 (×5): 30 mg via SUBCUTANEOUS
  Filled 2020-10-02 (×5): qty 0.3

## 2020-10-02 MED ORDER — FLEET ENEMA 7-19 GM/118ML RE ENEM: 1.0000 | ENEMA | Freq: Once | RECTAL | Status: DC | PRN

## 2020-10-02 MED ORDER — ROSUVASTATIN CALCIUM 5 MG PO TABS
5.0000 mg | ORAL_TABLET | ORAL | Status: DC
  Administered 2020-10-03 – 2020-10-05 (×2): 5 mg via ORAL
  Filled 2020-10-02 (×4): qty 1

## 2020-10-02 MED ORDER — TRANEXAMIC ACID-NACL 1000-0.7 MG/100ML-% IV SOLN
INTRAVENOUS | Status: AC
  Filled 2020-10-02: qty 100

## 2020-10-02 MED ORDER — BISACODYL 10 MG RE SUPP
10.0000 mg | Freq: Every day | RECTAL | Status: DC | PRN
  Filled 2020-10-02: qty 1

## 2020-10-02 MED ORDER — APREPITANT 40 MG PO CAPS
ORAL_CAPSULE | ORAL | Status: AC
  Administered 2020-10-02: 40 mg via ORAL
  Filled 2020-10-02: qty 1

## 2020-10-02 MED ORDER — SENNOSIDES-DOCUSATE SODIUM 8.6-50 MG PO TABS
1.0000 | ORAL_TABLET | Freq: Two times a day (BID) | ORAL | Status: DC
  Administered 2020-10-02 – 2020-10-05 (×6): 1 via ORAL
  Filled 2020-10-02 (×7): qty 1

## 2020-10-02 MED ORDER — CELECOXIB 200 MG PO CAPS
ORAL_CAPSULE | ORAL | Status: AC
  Administered 2020-10-02: 400 mg via ORAL
  Filled 2020-10-02: qty 2

## 2020-10-02 MED ORDER — 0.9 % SODIUM CHLORIDE (POUR BTL) OPTIME
TOPICAL | Status: DC | PRN
  Administered 2020-10-02: 1000 mL

## 2020-10-02 MED ORDER — LACTATED RINGERS IV SOLN: INTRAVENOUS | Status: DC

## 2020-10-02 MED ORDER — SURGIPHOR WOUND IRRIGATION SYSTEM - OPTIME
TOPICAL | Status: DC | PRN
  Administered 2020-10-02: 1 via TOPICAL

## 2020-10-02 MED ORDER — SODIUM CHLORIDE 0.9 % IV SOLN
INTRAVENOUS | Status: DC | PRN
  Administered 2020-10-02: 60 mL

## 2020-10-02 MED ORDER — AMLODIPINE BESYLATE 10 MG PO TABS
10.0000 mg | ORAL_TABLET | Freq: Every day | ORAL | Status: DC
  Administered 2020-10-04 – 2020-10-05 (×2): 10 mg via ORAL
  Filled 2020-10-02 (×4): qty 1

## 2020-10-02 SURGICAL SUPPLY — 73 items
ATTUNE PS FEM RT SZ 4 CEM KNEE (Femur) ×1 IMPLANT
ATTUNE PSRP INSR SZ4 5 KNEE (Insert) ×1 IMPLANT
BASEPLATE TIBIAL ROTATING SZ 4 (Knees) ×1 IMPLANT
BATTERY INSTRU NAVIGATION (MISCELLANEOUS) ×8 IMPLANT
BLADE SAW 70X12.5 (BLADE) ×2 IMPLANT
BLADE SAW 90X13X1.19 OSCILLAT (BLADE) ×2 IMPLANT
BLADE SAW 90X25X1.19 OSCILLAT (BLADE) ×2 IMPLANT
BONE CEMENT GENTAMICIN (Cement) ×4 IMPLANT
CEMENT BONE GENTAMICIN 40 (Cement) IMPLANT
COOLER POLAR GLACIER W/PUMP (MISCELLANEOUS) ×2 IMPLANT
CUFF TOURN SGL QUICK 24 (TOURNIQUET CUFF)
CUFF TOURN SGL QUICK 34 (TOURNIQUET CUFF)
CUFF TRNQT CYL 24X4X16.5-23 (TOURNIQUET CUFF) IMPLANT
CUFF TRNQT CYL 34X4.125X (TOURNIQUET CUFF) IMPLANT
DRAPE 3/4 80X56 (DRAPES) ×2 IMPLANT
DRSG DERMACEA 8X12 NADH (GAUZE/BANDAGES/DRESSINGS) ×2 IMPLANT
DRSG MEPILEX SACRM 8.7X9.8 (GAUZE/BANDAGES/DRESSINGS) ×2 IMPLANT
DRSG OPSITE POSTOP 4X14 (GAUZE/BANDAGES/DRESSINGS) ×2 IMPLANT
DRSG TEGADERM 4X4.75 (GAUZE/BANDAGES/DRESSINGS) ×2 IMPLANT
DURAPREP 26ML APPLICATOR (WOUND CARE) ×4 IMPLANT
ELECT CAUTERY BLADE 6.4 (BLADE) ×2 IMPLANT
ELECT REM PT RETURN 9FT ADLT (ELECTROSURGICAL) ×2
ELECTRODE REM PT RTRN 9FT ADLT (ELECTROSURGICAL) ×1 IMPLANT
EX-PIN ORTHOLOCK NAV 4X150 (PIN) ×4 IMPLANT
GAUZE 4X4 16PLY ~~LOC~~+RFID DBL (SPONGE) ×2 IMPLANT
GLOVE SURG ENC MOIS LTX SZ7.5 (GLOVE) ×4 IMPLANT
GLOVE SURG ENC TEXT LTX SZ7.5 (GLOVE) ×4 IMPLANT
GLOVE SURG UNDER LTX SZ8 (GLOVE) ×2 IMPLANT
GLOVE SURG UNDER POLY LF SZ7.5 (GLOVE) ×2 IMPLANT
GOWN STRL REUS W/ TWL LRG LVL3 (GOWN DISPOSABLE) ×2 IMPLANT
GOWN STRL REUS W/ TWL XL LVL3 (GOWN DISPOSABLE) ×1 IMPLANT
GOWN STRL REUS W/TWL LRG LVL3 (GOWN DISPOSABLE) ×2
GOWN STRL REUS W/TWL XL LVL3 (GOWN DISPOSABLE) ×1
HEMOVAC 400CC 10FR (MISCELLANEOUS) ×2 IMPLANT
HOLDER FOLEY CATH W/STRAP (MISCELLANEOUS) ×2 IMPLANT
HOOD PEEL AWAY FLYTE STAYCOOL (MISCELLANEOUS) ×4 IMPLANT
IRRIGATION SURGIPHOR STRL (IV SOLUTION) ×2 IMPLANT
IV NS IRRIG 3000ML ARTHROMATIC (IV SOLUTION) ×2 IMPLANT
KIT TURNOVER KIT A (KITS) ×2 IMPLANT
KNIFE SCULPS 14X20 (INSTRUMENTS) ×2 IMPLANT
LABEL OR SOLS (LABEL) ×2 IMPLANT
MANIFOLD NEPTUNE II (INSTRUMENTS) ×4 IMPLANT
NDL SAFETY ECLIPSE 18X1.5 (NEEDLE) ×1 IMPLANT
NDL SPNL 20GX3.5 QUINCKE YW (NEEDLE) ×2 IMPLANT
NEEDLE HYPO 18GX1.5 SHARP (NEEDLE) ×1
NEEDLE SPNL 20GX3.5 QUINCKE YW (NEEDLE) ×4 IMPLANT
NS IRRIG 500ML POUR BTL (IV SOLUTION) ×2 IMPLANT
PACK TOTAL KNEE (MISCELLANEOUS) ×2 IMPLANT
PAD WRAPON POLAR KNEE (MISCELLANEOUS) ×1 IMPLANT
PATELLA MEDIAL ATTUN 35MM KNEE (Knees) ×1 IMPLANT
PENCIL SMOKE EVACUATOR COATED (MISCELLANEOUS) ×2 IMPLANT
PIN DRILL FIX HALF THREAD (BIT) ×4 IMPLANT
PIN FIXATION 1/8DIA X 3INL (PIN) ×3 IMPLANT
PULSAVAC PLUS IRRIG FAN TIP (DISPOSABLE) ×2
SOL PREP PVP 2OZ (MISCELLANEOUS) ×2
SOLUTION PREP PVP 2OZ (MISCELLANEOUS) ×1 IMPLANT
SPONGE DRAIN TRACH 4X4 STRL 2S (GAUZE/BANDAGES/DRESSINGS) ×2 IMPLANT
SPONGE T-LAP 18X18 ~~LOC~~+RFID (SPONGE) ×6 IMPLANT
STAPLER SKIN PROX 35W (STAPLE) ×2 IMPLANT
STOCKINETTE IMPERV 14X48 (MISCELLANEOUS) IMPLANT
STRAP TIBIA SHORT (MISCELLANEOUS) ×2 IMPLANT
SUCTION FRAZIER HANDLE 10FR (MISCELLANEOUS) ×1
SUCTION TUBE FRAZIER 10FR DISP (MISCELLANEOUS) ×1 IMPLANT
SUT VIC AB 0 CT1 36 (SUTURE) ×4 IMPLANT
SUT VIC AB 1 CT1 36 (SUTURE) ×4 IMPLANT
SUT VIC AB 2-0 CT2 27 (SUTURE) ×2 IMPLANT
SYR 20ML LL LF (SYRINGE) ×2 IMPLANT
SYR 30ML LL (SYRINGE) ×4 IMPLANT
TIP FAN IRRIG PULSAVAC PLUS (DISPOSABLE) ×1 IMPLANT
TOWEL OR 17X26 4PK STRL BLUE (TOWEL DISPOSABLE) ×2 IMPLANT
TOWER CARTRIDGE SMART MIX (DISPOSABLE) ×2 IMPLANT
TRAY FOLEY MTR SLVR 16FR STAT (SET/KITS/TRAYS/PACK) ×2 IMPLANT
WRAPON POLAR PAD KNEE (MISCELLANEOUS) ×2

## 2020-10-02 NOTE — Anesthesia Preprocedure Evaluation (Signed)
Anesthesia Evaluation  Patient identified by MRN, date of birth, ID band Patient awake    Reviewed: Allergy & Precautions, H&P , NPO status , Patient's Chart, lab work & pertinent test results, reviewed documented beta blocker date and time   History of Anesthesia Complications (+) PONV, Family history of anesthesia reaction and history of anesthetic complications  Airway Mallampati: III   Neck ROM: full    Dental  (+) Poor Dentition   Pulmonary neg pulmonary ROS, Current Smoker and Patient abstained from smoking.,    Pulmonary exam normal        Cardiovascular Exercise Tolerance: Good hypertension, On Medications negative cardio ROS Normal cardiovascular exam Rhythm:regular Rate:Normal     Neuro/Psych PSYCHIATRIC DISORDERS Anxiety Depression Bipolar Disorder negative neurological ROS     GI/Hepatic negative GI ROS, Neg liver ROS,   Endo/Other  negative endocrine ROS  Renal/GU negative Renal ROS  negative genitourinary   Musculoskeletal   Abdominal   Peds  Hematology negative hematology ROS (+)   Anesthesia Other Findings Past Medical History: No date: Anxiety No date: Arthritis No date: Bipolar 1 disorder (HCC) No date: Depression No date: Family history of adverse reaction to anesthesia     Comment:  half sister claims that she would stop breathing during               surgery x 2 No date: Hypertension No date: Osteoarthritis No date: PONV (postoperative nausea and vomiting) No date: Pre-diabetes Past Surgical History: No date: ANTERIOR CRUCIATE LIGAMENT (ACL) REVISION; Right No date: CARPAL TUNNEL RELEASE No date: COLONOSCOPY 11/17/2017: COLONOSCOPY WITH PROPOFOL; N/A     Comment:  Procedure: COLONOSCOPY WITH PROPOFOL;  Surgeon:               Virgel Manifold, MD;  Location: [facility] ENDOSCOPY;                Service: Endoscopy;  Laterality: N/A; 2013: DILATION AND CURETTAGE OF UTERUS     Comment:   for heavy bleeding 2013: ENDOMETRIAL ABLATION No date: right wrist surgery No date: TUBAL LIGATION 1990: WRIST ARTHROSCOPY; Left     Comment:  for degenerative changes BMI    Body Mass Index: 30.73 kg/m     Reproductive/Obstetrics negative OB ROS                             Anesthesia Physical Anesthesia Plan  ASA: 3  Anesthesia Plan: Spinal   Post-op Pain Management:    Induction:   PONV Risk Score and Plan:   Airway Management Planned:   Additional Equipment:   Intra-op Plan:   Post-operative Plan:   Informed Consent: I have reviewed the patients History and Physical, chart, labs and discussed the procedure including the risks, benefits and alternatives for the proposed anesthesia with the patient or authorized representative who has indicated his/her understanding and acceptance.     Dental Advisory Given  Plan Discussed with: CRNA  Anesthesia Plan Comments:         Anesthesia Quick Evaluation

## 2020-10-02 NOTE — Transfer of Care (Signed)
Immediate Anesthesia Transfer of Care Note  Patient: Alexa Newman  Procedure(s) Performed: COMPUTER ASSISTED TOTAL KNEE ARTHROPLASTY (Right: Knee)  Patient Location: PACU  Anesthesia Type:Spinal  Level of Consciousness: awake, alert  and oriented  Airway & Oxygen Therapy: Patient Spontanous Breathing and Patient connected to face mask oxygen  Post-op Assessment: Report given to RN and Post -op Vital signs reviewed and stable  Post vital signs: Reviewed and stable  Last Vitals:  Vitals Value Taken Time  BP 99/69 10/02/20 1536  Temp    Pulse 86 10/02/20 1538  Resp 21 10/02/20 1538  SpO2 97 % 10/02/20 1538  Vitals shown include unvalidated device data.  Last Pain:  Vitals:   10/02/20 0946  TempSrc: Oral  PainSc: 0-No pain      Patients Stated Pain Goal: 0 ([id number])  Complications: No notable events documented.

## 2020-10-02 NOTE — H&P (Signed)
The patient has been re-examined, and the chart reviewed, and there have been no interval changes to the documented history and physical.    The risks, benefits, and alternatives have been discussed at length. The patient expressed understanding of the risks benefits and agreed with plans for surgical intervention.  Harbert Fitterer P. Mats Jeanlouis, Jr. M.D.    

## 2020-10-02 NOTE — Anesthesia Procedure Notes (Signed)
Spinal  Patient location during procedure: OR Start time: 10/02/2020 11:20 AM End time: 10/02/2020 11:38 AM Reason for block: surgical anesthesia Staffing Performed: anesthesiologist  Anesthesiologist: Molli Barrows, MD Resident/CRNA: Jerrye Noble, CRNA Preanesthetic Checklist Completed: patient identified, IV checked, site marked, risks and benefits discussed, surgical consent, monitors and equipment checked and pre-op evaluation Spinal Block Patient position: sitting Prep: ChloraPrep Patient monitoring: heart rate, continuous pulse ox and blood pressure Approach: midline Location: L3-4 Injection technique: single-shot Needle Needle type: Whitacre  Needle gauge: 24 G Needle length: 10 cm Assessment Events: CSF return Additional Notes CRNA attempted X 1. MDA attempted X 1. IV functioning, monitors applied to pt. Expiration date of kit checked and confirmed to be in date. Sterile prep and drape, hand hygiene and sterile gloved used. Pt was positioned and spine was prepped in sterile fashion. Skin was anesthetized with lidocaine. Free flow of clear CSF obtained prior to injecting local anesthetic into CSF x 1 attempt. Spinal needle aspirated freely following injection. Needle was carefully withdrawn, and pt tolerated procedure well. Loss of motor and sensory on exam post injection.

## 2020-10-02 NOTE — Op Note (Signed)
OPERATIVE NOTE  DATE OF SURGERY:  10/02/2020  PATIENT NAME:  Alexa Newman   DOB: [birth year]  MRN: [id number]  PRE-OPERATIVE DIAGNOSIS: Degenerative arthrosis of the right knee, primary Retained hardware status post right ACL reconstruction  POST-OPERATIVE DIAGNOSIS:  Same  PROCEDURE:  Right total knee arthroplasty using computer-assisted navigation Removal of hardware (bone staple) from the right knee  SURGEON:  Marciano Sequin. M.D.  ASSISTANT: Cassell Smiles, PA-C (present and scrubbed throughout the case, critical for assistance with exposure, retraction, instrumentation, and closure)  ANESTHESIA: spinal  ESTIMATED BLOOD LOSS: 50 mL  FLUIDS REPLACED: 1400 mL of crystalloid  TOURNIQUET TIME: 115 minutes  DRAINS: 2 medium Hemovac drains  SOFT TISSUE RELEASES: Anterior cruciate ligament, posterior cruciate ligament, deep medial collateral ligament, patellofemoral ligament  IMPLANTS UTILIZED: DePuy Attune size 4 posterior stabilized femoral component (cemented), size 4 rotating platform tibial component (cemented), 35 mm medialized dome patella (cemented), and a 5 mm stabilized rotating platform polyethylene insert.  INDICATIONS FOR SURGERY: Alexa Newman is a 60 y.o. year old female with a long history of progressive knee pain. X-rays demonstrated severe degenerative changes in tricompartmental fashion. The patient had not seen any significant improvement despite conservative nonsurgical intervention. After discussion of the risks and benefits of surgical intervention, the patient expressed understanding of the risks benefits and agree with plans for total knee arthroplasty.   The risks, benefits, and alternatives were discussed at length including but not limited to the risks of infection, bleeding, nerve injury, stiffness, blood clots, the need for revision surgery, cardiopulmonary complications, among others, and they were willing to proceed.  PROCEDURE IN DETAIL: The  patient was brought into the operating room and, after adequate spinal anesthesia was achieved, a tourniquet was placed on the patient's upper thigh. The patient's knee and leg were cleaned and prepped with alcohol and DuraPrep and draped in the usual sterile fashion. A "timeout" was performed as per usual protocol. The lower extremity was exsanguinated using an Esmarch, and the tourniquet was inflated to 300 mmHg. An anterior longitudinal incision was made followed by a standard mid vastus approach. The deep fibers of the medial collateral ligament were elevated in a subperiosteal fashion off of the medial flare of the tibia so as to maintain a continuous soft tissue sleeve. The patella was subluxed laterally and the patellofemoral ligament was incised. Inspection of the knee demonstrated severe degenerative changes with full-thickness loss of articular cartilage. Osteophytes were debrided using a rongeur. Anterior and posterior cruciate ligaments were excised.  The bone staple was localized lysed along the metaphyseal area of the tibia.  An extraction device was applied and the staple was carefully backed out.  Two 4.0 mm Schanz pins were inserted in the femur and into the tibia for attachment of the array of trackers used for computer-assisted navigation. Hip center was identified using a circumduction technique. Distal landmarks were mapped using the computer. The distal femur and proximal tibia were mapped using the computer. The distal femoral cutting guide was positioned using computer-assisted navigation so as to achieve a 5 distal valgus cut. The femur was sized and it was felt that a size 4 femoral component was appropriate. A size 4 femoral cutting guide was positioned and the anterior cut was performed and verified using the computer. This was followed by completion of the posterior and chamfer cuts. Femoral cutting guide for the central box was then positioned in the center box cut was  performed.  Attention was then directed to  the proximal tibia. Medial and lateral menisci were excised. The extramedullary tibial cutting guide was positioned using computer-assisted navigation so as to achieve a 0 varus-valgus alignment and 3 posterior slope. The cut was performed and verified using the computer. The proximal tibia was sized and it was felt that a size 4 tibial tray was appropriate. Tibial and femoral trials were inserted followed by insertion of a 5 mm polyethylene insert.  The knee was felt to be tight both in flexion and in extension.  The trial components were removed and the extra medullary tibial cutting guide was repositioned so as to resect an additional 2 mm of bone.  Cut was performed verified using computer.  Trial components were reinserted followed by insertion of a 5 mm polyethylene trial.  This allowed for excellent mediolateral soft tissue balancing both in flexion and in full extension. Finally, the patella was cut and prepared so as to accommodate a 35 mm medialized dome patella. A patella trial was placed and the knee was placed through a range of motion with excellent patellar tracking appreciated. The femoral trial was removed after debridement of posterior osteophytes. The central post-hole for the tibial component was reamed followed by insertion of a keel punch. Tibial trials were then removed.  Bone graft was obtained from the resected portions of the tibia and packed along the previous tibial tunnel from the ACL reconstruction.  Cut surfaces of bone were irrigated with copious amounts of normal saline using pulsatile lavage and then suctioned dry. Polymethylmethacrylate cement with gentamicin was prepared in the usual fashion using a vacuum mixer. Cement was applied to the cut surface of the proximal tibia as well as along the undersurface of a size 4 rotating platform tibial component. Tibial component was positioned and impacted into place. Excess cement was removed  using [employer] fast streamer. Cement was then applied to the cut surfaces of the femur as well as along the posterior flanges of the size 4 femoral component. The femoral component was positioned and impacted into place. Excess cement was removed using [employer] fast streamer. A 5 mm polyethylene trial was inserted and the knee was brought into full extension with steady axial compression applied. Finally, cement was applied to the backside of a 35 mm medialized dome patella and the patellar component was positioned and patellar clamp applied. Excess cement was removed using [employer] fast streamer. After adequate curing of the cement, the tourniquet was deflated after a total tourniquet time of 115 minutes. Hemostasis was achieved using electrocautery. The knee was irrigated with copious amounts of normal saline using pulsatile lavage followed by 500 ml of Surgiphor and then suctioned dry. 20 mL of 1.3% Exparel and 60 mL of 0.25% Marcaine in 40 mL of normal saline was injected along the posterior capsule, medial and lateral gutters, and along the arthrotomy site. A 5 mm stabilized rotating platform polyethylene insert was inserted and the knee was placed through a range of motion with excellent mediolateral soft tissue balancing appreciated and excellent patellar tracking noted. 2 medium drains were placed in the wound bed and brought out through separate stab incisions. The medial parapatellar portion of the incision was reapproximated using interrupted sutures of #1 Vicryl. Subcutaneous tissue was approximated in layers using first #0 Vicryl followed #2-0 Vicryl. The skin was approximated with skin staples. A sterile dressing was applied.  The patient tolerated the procedure well and was transported to the recovery room in stable condition.    Sherrill Buikema P. Holley Bouche., M.D.

## 2020-10-03 ENCOUNTER — Encounter: Payer: Self-pay | Admitting: Orthopedic Surgery

## 2020-10-03 DIAGNOSIS — Z96659 Presence of unspecified artificial knee joint: Secondary | ICD-10-CM | POA: Diagnosis not present

## 2020-10-03 DIAGNOSIS — M1711 Unilateral primary osteoarthritis, right knee: Secondary | ICD-10-CM | POA: Diagnosis not present

## 2020-10-03 MED ORDER — NICOTINE 21 MG/24HR TD PT24
21.0000 mg | MEDICATED_PATCH | Freq: Every day | TRANSDERMAL | Status: DC
  Administered 2020-10-03 – 2020-10-05 (×3): 21 mg via TRANSDERMAL
  Filled 2020-10-03 (×3): qty 1

## 2020-10-03 NOTE — Plan of Care (Signed)

## 2020-10-03 NOTE — Evaluation (Signed)
Occupational Therapy Evaluation Patient Details Name: Alexa Newman MRN: [id number] DOB: [birth year] Today's Date: 10/03/2020    History of Present Illness 60 y/o female s/p R TKA on 10/02/20. 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   Clinical Impression   Pt seen for OT evaluation this date, POD#1 from above surgery. Pt was independent in all ADLs prior to surgery, however occasionally using a SPC due to R knee pain. Pt is eager to return to PLOF with less pain and improved safety and independence. Pt currently requires SUPV for LB dressing while in seated position.  Pt instructed in polar care mgt, falls prevention strategies, home/routines modifications, DME/AE for LB bathing and dressing tasks, and compression stocking mgt. Pt verbalizes understanding and provides teach-back. Therapist and pt in agreement that no further OT services are needed at this point. Pt does need a RW prior to DC.     Follow Up Recommendations  No OT follow up    Equipment Recommendations  Other (comment) (RW)    Recommendations for Other Services       Precautions / Restrictions Precautions Precautions: Knee Precaution Booklet Issued: Yes (comment) Restrictions Weight Bearing Restrictions: Yes RLE Weight Bearing: Weight bearing as tolerated Other Position/Activity Restrictions: Bone foam for knee extension      Mobility Bed Mobility Overal bed mobility: Needs Assistance Bed Mobility: Supine to Sit;Sit to Supine     Supine to sit: Supervision Sit to supine: Supervision   General bed mobility comments: extra time, effort, w/ use of bed rails    Transfers Overall transfer level: Needs assistance Equipment used: Rolling walker (2 wheeled) Transfers: Sit to/from Stand Sit to Stand: Supervision         General transfer comment: good safety awareness    Balance Overall balance assessment: Needs  assistance Sitting-balance support: No upper extremity supported Sitting balance-Leahy Scale: Normal     Standing balance support: Bilateral upper extremity supported Standing balance-Leahy Scale: Good Standing balance comment: Steady standing balance w/ reaching outside BOS                           ADL either performed or assessed with clinical judgement   ADL Overall ADL's : Needs assistance/impaired                     Lower Body Dressing: Supervision/safety Lower Body Dressing Details (indicate cue type and reason): able to reach feet, don socks Toilet Transfer: Supervision/safety;RW   Toileting- Clothing Manipulation and Hygiene: Independent               Vision Patient Visual Report: No change from baseline       Perception     Praxis      Pertinent Vitals/Pain Pain Assessment: 0-10 Pain Score: 3  Pain Intervention(s): Limited activity within patient's tolerance;Repositioned;Premedicated before session;Ice applied     Hand Dominance Right   Extremity/Trunk Assessment Upper Extremity Assessment Upper Extremity Assessment: Overall WFL for tasks assessed   Lower Extremity Assessment Lower Extremity Assessment: RLE deficits/detail RLE Deficits / Details: s/p R TKA. LLE WFL. RLE Sensation: WNL   Cervical / Trunk Assessment Cervical / Trunk Assessment: Normal   Communication Communication Communication: No difficulties   Cognition Arousal/Alertness: Awake/alert Behavior During Therapy: WFL for tasks assessed/performed Overall Cognitive Status: Within Functional Limits for tasks assessed  General Comments: A&Ox4   General Comments       Exercises Total Joint Exercises Ankle Circles/Pumps: AROM;20 reps;Seated Quad Sets: AROM;10 reps;Seated Gluteal Sets: AROM;10 reps;Seated Other Exercises Other Exercises: Pt eductaed on PT role, POC, weight bearing status, post-surgical goals and  exercises. Education on safety with ambulation, rec to call RN/staff prior to standing. Pt toileted with CGA to perform toilet transfer, independent with hygiene. Other Exercises: Educ re AE for LB dressing/bathing, polar care mgmt, TED hose mgmt, home modifications, falls prevention   Shoulder Instructions      Home Living Family/patient expects to be discharged to:: Private residence Living Arrangements: Alone Available Help at Discharge: Family Type of Home: House Home Access: Stairs to enter Technical brewer of Steps: 2 Entrance Stairs-Rails: Can reach both;Right;Left Home Layout: One level     Bathroom Shower/Tub: Teacher, early years/pre: Handicapped height Bathroom Accessibility: Yes   Home Equipment: [place] - single point   Additional Comments: Will be living at her daughter's house after d/c until ready to move back to her house independently      Prior Functioning/Environment Level of Independence: Independent                 OT Problem List: Impaired balance (sitting and/or standing);Decreased knowledge of use of DME or AE;Decreased strength;Decreased range of motion;Decreased activity tolerance      OT Treatment/Interventions:      OT Goals(Current goals can be found in the care plan section) Acute Rehab OT Goals Patient Stated Goal: to be free of knee pain OT Goal Formulation: With patient Time For Goal Achievement: 10/17/20 Potential to Achieve Goals: Good  OT Frequency:     Barriers to D/C:            Co-evaluation              AM-PAC OT "6 Clicks" Daily Activity     Outcome Measure Help from another person eating meals?: None Help from another person taking care of personal grooming?: None Help from another person toileting, which includes using toliet, bedpan, or urinal?: A Little Help from another person bathing (including washing, rinsing, drying)?: A Little Help from another person to put on and taking off regular  upper body clothing?: None Help from another person to put on and taking off regular lower body clothing?: A Little 6 Click Score: 21   End of Session Equipment Utilized During Treatment: Rolling walker  Activity Tolerance: Patient tolerated treatment well Patient left: in chair;with call bell/phone within reach  OT Visit Diagnosis: Unsteadiness on feet (R26.81);Muscle weakness (generalized) (M62.81)                Time: [id number] OT Time Calculation (min): 20 min Charges:  OT General Charges $OT Visit: 1 Visit OT Evaluation $OT Eval Low Complexity: 1 Low OT Treatments $Self Care/Home Management : 8-22 mins Josiah Lobo, PhD, MS, OTR/L 10/03/20, 12:55 PM

## 2020-10-03 NOTE — Progress Notes (Signed)
  Subjective: 1 Day Post-Op Procedure(s) (LRB): COMPUTER ASSISTED TOTAL KNEE ARTHROPLASTY (Right) Patient reports pain as well-controlled.   Patient is well, but has had some minor complaints of increased irritability due to not having a cigarette. Typically smokes a pack a day Plan is to go Home after hospital stay. Negative for chest pain and shortness of breath Fever: no Gastrointestinal: negative for nausea and vomiting.  Patient has not had a bowel movement.  Objective: Vital signs in last 24 hours: Temp:  [97.1 F (36.2 C)-98.6 F (37 C)] 97.8 F (36.6 C) (07/28 0824) Pulse Rate:  [67-94] 77 (07/28 0824) Resp:  [15-21] 15 (07/28 0824) BP: (89-122)/(51-89) 109/78 (07/28 0824) SpO2:  [91 %-99 %] 96 % (07/28 0824) Weight:  [78.7 kg-80.5 kg] 80.5 kg (07/27 1847)  Intake/Output from previous day:  Intake/Output Summary (Last 24 hours) at 10/03/2020 0825 Last data filed at 10/03/2020 0230 Gross per 24 hour  Intake 1640 ml  Output 850 ml  Net 790 ml    Intake/Output this shift: No intake/output data recorded.  Labs: No results for input(s): HGB in the last 72 hours. No results for input(s): WBC, RBC, HCT, PLT in the last 72 hours. No results for input(s): NA, K, CL, CO2, BUN, CREATININE, GLUCOSE, CALCIUM in the last 72 hours. No results for input(s): LABPT, INR in the last 72 hours.   EXAM General - Patient is Alert, Appropriate, and Oriented Extremity - Neurovascular intact Dorsiflexion/Plantar flexion intact Compartment soft Dressing/Incision -Postoperative dressing remains in place., Polar Care in place and working. , Hemovac in place.  Motor Function - intact, moving foot and toes well on exam. Able to perform independent SLR.  Cardiovascular- Regular rate and rhythm, no murmurs/rubs/gallops Respiratory- Lungs clear to auscultation bilaterally Gastrointestinal- soft, nontender, and active bowel sounds   Assessment/Plan: 1 Day Post-Op Procedure(s)  (LRB): COMPUTER ASSISTED TOTAL KNEE ARTHROPLASTY (Right) Active Problems:   Total knee replacement status  Estimated body mass index is 27.8 kg/m as calculated from the following:   Height as of this encounter: '5\' 7"'$  (1.702 m).   Weight as of this encounter: 80.5 kg. Advance diet Up with therapy       DVT Prophylaxis - Lovenox, Ted hose, and foot pumps Weight-Bearing as tolerated to right leg  Cassell Smiles, PA-C [facility] Orthopaedic Surgery 10/03/2020, 8:25 AM

## 2020-10-03 NOTE — Anesthesia Postprocedure Evaluation (Signed)
Anesthesia Post Note  Patient: Alexa Newman  Procedure(s) Performed: COMPUTER ASSISTED TOTAL KNEE ARTHROPLASTY (Right: Knee)  Patient location during evaluation: Nursing Unit Anesthesia Type: Spinal Level of consciousness: awake, awake and alert, oriented and patient cooperative Pain management: pain level controlled Vital Signs Assessment: post-procedure vital signs reviewed and stable Respiratory status: spontaneous breathing, nonlabored ventilation and respiratory function stable Cardiovascular status: blood pressure returned to baseline Postop Assessment: no headache, no backache, adequate PO intake, no apparent nausea or vomiting and patient able to bend at knees Anesthetic complications: no   No notable events documented.   Last Vitals:  Vitals:   10/02/20 2047 10/03/20 0550  BP: 121/70 102/65  Pulse: 94 67  Resp: 20 18  Temp: 36.7 C (!) 36.4 C  SpO2: 95% 93%    Last Pain:  Vitals:   10/03/20 0550  TempSrc: Oral  PainSc:                  [name]

## 2020-10-03 NOTE — Progress Notes (Signed)
Met with the patient in the room to discuss DC plan and needs She lives alone but will be going to her daughters at DC She has a cane at home but will need a RW and a 3 in 1, [employer] will bring it into the room She has transportation and can afford her medication She is set up with [facility], they have already called her and plan to go to see her tomorrow, no additional needs

## 2020-10-03 NOTE — Progress Notes (Signed)
Physical Therapy Treatment Patient Details Name: Alexa Newman MRN: [id number] DOB: 28-Mar-1960 Today's Date: 10/03/2020    History of Present Illness 60 y/o female s/p R TKA on 10/02/20. 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    PT Comments    Pt supine in bed and agreeable to therapy. All exercises in post-op TKA packet were performed, 15 reps each. During heel slides, pt did report pain that improved with increased reps. Pt demo ability to navigate 4 steps using bilateral railings, as will be available at home. Stairs were performed with CGA and minimal VC on sequencing - pt was able to recite instructions back to PT. Ambulation distance remains limited due to pain. Occasional knee buckle when WB through RLE due to pain - CGA required. She asked about WB status; PT re-educated on the definition of WBAT. All questions answered prior to leaving. Would benefit from skilled PT to address above deficits and promote optimal return to PLOF.   Follow Up Recommendations  Home health PT     Equipment Recommendations  Rolling walker with 5" wheels    Recommendations for Other Services       Precautions / Restrictions Precautions Precautions: Knee Restrictions Weight Bearing Restrictions: Yes RLE Weight Bearing: Weight bearing as tolerated Other Position/Activity Restrictions: Bone foam for knee extension    Mobility  Bed Mobility Overal bed mobility: Modified Independent Bed Mobility: Supine to Sit;Sit to Supine     Supine to sit: Supervision Sit to supine: Supervision   General bed mobility comments: increased time with bed rails    Transfers Overall transfer level: Needs assistance Equipment used: Rolling walker (2 wheeled) Transfers: Sit to/from Stand Sit to Stand: Supervision         General transfer comment: good safety awareness  Ambulation/Gait Ambulation/Gait assistance:  Min guard Gait Distance (Feet): 15 Feet Assistive device: Rolling walker (2 wheeled) Gait Pattern/deviations: Step-through pattern;Decreased step length - left;Decreased stance time - right;Decreased weight shift to right;Narrow base of support Gait velocity: decreased   General Gait Details: 57f x 2 reps. CGA required on one occasion to steady due to R knee buckle 2/2 pain with WB   Stairs Stairs: Yes Stairs assistance: Min guard Stair Management: Two rails;Step to pattern;Forwards (with bilateral rails) Number of Stairs: 4 General stair comments: Pt followed instructions well. No concerns or LOB.   Wheelchair Mobility    Modified Rankin (Stroke Patients Only)       Balance Overall balance assessment: Needs assistance Sitting-balance support: No upper extremity supported Sitting balance-Leahy Scale: Normal     Standing balance support: Bilateral upper extremity supported Standing balance-Leahy Scale: Good Standing balance comment: Steady standing balance w/ reaching outside BOS                            Cognition Arousal/Alertness: Awake/alert Behavior During Therapy: WFL for tasks assessed/performed Overall Cognitive Status: Within Functional Limits for tasks assessed                                 General Comments: A&Ox4      Exercises Total Joint Exercises Ankle Circles/Pumps: AROM;20 reps;Seated;Right [employer] AROM;15 reps;Supine;Strengthening;Right Heel Slides: 15 reps;AAROM;Right;Supine;Strengthening Hip ABduction/ADduction: AROM;Right;15 reps;Supine;Strengthening Straight Leg Raises: AROM;Supine;15 reps;Strengthening;Right Long Arc Quad: AROM;Strengthening;Right;15 reps;Seated Other Exercises Other Exercises: Educ re AE for LB dressing/bathing, polar care  mgmt, TED hose mgmt, home modifications, falls prevention    General Comments        Pertinent Vitals/Pain Pain Assessment: 0-10 Pain Score: 3  Pain Location: R  knee Pain Descriptors / Indicators: Aching (described as "not bad") Pain Intervention(s): Limited activity within patient's tolerance;Repositioned;Premedicated before session;Ice applied    Home Living Family/patient expects to be discharged to:: Private residence Living Arrangements: Alone Available Help at Discharge: Family Type of Home: House Home Access: Stairs to enter Entrance Stairs-Rails: Can reach both;Right;Left Home Layout: One level Home Equipment: [place] - single point Additional Comments: Will be living at her daughter's house after d/c until ready to move back to her house independently    Prior Function Level of Independence: Independent          PT Goals (current goals can now be found in the care plan section) Acute Rehab PT Goals Patient Stated Goal: to return to full mobility PT Goal Formulation: With patient Time For Goal Achievement: 10/17/20 Potential to Achieve Goals: Good    Frequency    BID      PT Plan      Co-evaluation              AM-PAC PT "6 Clicks" Mobility   Outcome Measure  Help needed turning from your back to your side while in a flat bed without using bedrails?: None Help needed moving from lying on your back to sitting on the side of a flat bed without using bedrails?: None Help needed moving to and from a bed to a chair (including a wheelchair)?: A Little Help needed standing up from a chair using your arms (e.g., wheelchair or bedside chair)?: A Little Help needed to walk in hospital room?: A Little Help needed climbing 3-5 steps with a railing? : A Little 6 Click Score: 20    End of Session Equipment Utilized During Treatment: Gait belt Activity Tolerance: Patient tolerated treatment well;Patient limited by pain Patient left: with call bell/phone within reach;Other (comment);in bed (no SCD, rec pt perform ankle pumps regularly without them) Nurse Communication: Mobility status PT Visit Diagnosis: Unsteadiness on feet  (R26.81);Other abnormalities of gait and mobility (R26.89);Muscle weakness (generalized) (M62.81)     Time: [id number] PT Time Calculation (min) (ACUTE ONLY): 35 min  Charges:  $Therapeutic Exercise: 8-22 mins $Therapeutic Activity: 8-22 mins                     Patrina Levering PT, DPT 10/03/20 4:07 PM [id number]    [facility] 10/03/2020, 4:02 PM

## 2020-10-03 NOTE — Evaluation (Addendum)
Physical Therapy Evaluation Patient Details Name: Alexa Newman MRN: [id number] DOB: 1960-11-05 Today's Date: 10/03/2020   History of Present Illness  60 y/o female s/p R TKA on 10/02/20. 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  Clinical Impression  Pt received supine in bed with RN providing medications. Pt alert and agreeable to therapy. States she has performed a stand-pivot transfer to [facility] but otherwise has not been out of bed since surgery. Bed mobility was performed safely with SUP. RW was used for all OOB mobility including STS, ambulatory toilet transfer and ambulation within hospital room. Ambulation distance was limited by pain in RLE. Pt currently demo decreased strength and ROM in RLE - not formally tested due to recent surgery. She also demo decreased out of bed endurance with mobility, limited ambulation distance and mild gait deviations. Recommending HHPT upon d/c from hospital. Pt will d/c to daughter's house as it is more accessible and so that family can assist with care. Pt would benefit from skilled PT to address above deficits and promote optimal return to PLOF.    Follow Up Recommendations Home health PT    Equipment Recommendations  Rolling walker with 5" wheels    Recommendations for Other Services       Precautions / Restrictions Precautions Precautions: Knee Precaution Booklet Issued: Yes (comment) Restrictions Weight Bearing Restrictions: Yes RLE Weight Bearing: Weight bearing as tolerated LLE Weight Bearing: Weight bearing as tolerated Other Position/Activity Restrictions: Bone foam for knee extension      Mobility  Bed Mobility Overal bed mobility: Needs Assistance Bed Mobility: Supine to Sit;Sit to Supine     Supine to sit: Supervision Sit to supine: Supervision   General bed mobility comments: used bed rails, HOB elevated    Transfers Overall transfer  level: Needs assistance Equipment used: Rolling walker (2 wheeled) Transfers: Sit to/from Stand Sit to Stand: Min guard         General transfer comment: CGA for safety, no steadying required.  Ambulation/Gait Ambulation/Gait assistance: Min guard Gait Distance (Feet): 25 Feet Assistive device: Rolling walker (2 wheeled) Gait Pattern/deviations: Step-through pattern;Decreased step length - left;Decreased stance time - right;Decreased weight shift to right;Narrow base of support Gait velocity: decreased   General Gait Details: 88f x 2 reps. Steadying required on 2 occasions; decreased stance time and WB through RLE due to pain  Stairs            Wheelchair Mobility    Modified Rankin (Stroke Patients Only)       Balance Overall balance assessment: Needs assistance;Mild deficits observed, not formally tested Sitting-balance support: No upper extremity supported Sitting balance-Leahy Scale: Normal     Standing balance support: Bilateral upper extremity supported Standing balance-Leahy Scale: Fair Standing balance comment: Good during static standing, fair during mobility - CGA provided to steady                             Pertinent Vitals/Pain Pain Assessment: No/denies pain (Denies at rest. Pain did increase with WB and during mobility.)    Home Living Family/patient expects to be discharged to:: Private residence Living Arrangements: Alone Available Help at Discharge: Family Type of Home: House Home Access: Stairs to enter Entrance Stairs-Rails: Can reach both;Right;Left Entrance Stairs-Number of Steps: 2 Home Layout: One level Home Equipment: [place]ane - single point Additional Comments: Will be living at her daughter's house after d/c until  ready to move back to her house independently    Prior Function Level of Independence: Independent               Hand Dominance   Dominant Hand: Right    Extremity/Trunk Assessment   Upper  Extremity Assessment Upper Extremity Assessment: Overall WFL for tasks assessed    Lower Extremity Assessment Lower Extremity Assessment: RLE deficits/detail RLE Deficits / Details: s/p R TKA. LLE WFL. RLE Sensation: WNL    Cervical / Trunk Assessment Cervical / Trunk Assessment: Normal  Communication   Communication: No difficulties  Cognition Arousal/Alertness: Awake/alert Behavior During Therapy: WFL for tasks assessed/performed Overall Cognitive Status: Within Functional Limits for tasks assessed                                 General Comments: A&Ox4      General Comments      Exercises Total Joint Exercises Ankle Circles/Pumps: AROM;20 reps;Seated Quad Sets: AROM;10 reps;Seated Gluteal Sets: AROM;10 reps;Seated Other Exercises Other Exercises: Pt eductaed on PT role, POC, weight bearing status, post-surgical goals and exercises. Education on safety with ambulation, rec to call RN/staff prior to standing. Pt toileted with CGA to perform toilet transfer, independent with hygiene.   Assessment/Plan    PT Assessment Patient needs continued PT services  PT Problem List Decreased strength;Decreased mobility;Decreased range of motion;Decreased activity tolerance;Decreased balance       PT Treatment Interventions Therapeutic activities;DME instruction;Modalities;Gait training;Therapeutic exercise;Patient/family education;Stair training;Balance training;Functional mobility training;Neuromuscular re-education    PT Goals (Current goals can be found in the Care Plan section)  Acute Rehab PT Goals Patient Stated Goal: to return to full mobility PT Goal Formulation: With patient Time For Goal Achievement: 10/17/20 Potential to Achieve Goals: Good    Frequency BID   Barriers to discharge   N/A    Co-evaluation               AM-PAC PT "6 Clicks" Mobility  Outcome Measure Help needed turning from your back to your side while in a flat bed without  using bedrails?: None Help needed moving from lying on your back to sitting on the side of a flat bed without using bedrails?: None Help needed moving to and from a bed to a chair (including a wheelchair)?: A Little Help needed standing up from a chair using your arms (e.g., wheelchair or bedside chair)?: A Little Help needed to walk in hospital room?: A Little Help needed climbing 3-5 steps with a railing? : A Little 6 Click Score: 20    End of Session Equipment Utilized During Treatment: Gait belt Activity Tolerance: Patient tolerated treatment well;Patient limited by pain Patient left: in chair;with call bell/phone within reach;Other (comment) (no SCD, rec pt perform ankle pumps regularly while in chair) Nurse Communication: Mobility status PT Visit Diagnosis: Unsteadiness on feet (R26.81);Other abnormalities of gait and mobility (R26.89);Muscle weakness (generalized) (M62.81)    Time: [id number] PT Time Calculation (min) (ACUTE ONLY): 30 min   Charges:   PT Evaluation $PT Eval Low Complexity: 1 Low PT Treatments $Therapeutic Activity: 8-22 mins        Patrina Levering PT, DPT 10/03/20 10:11 AM [id number]   Ramon[facility] Lab 10/03/2020, 10:11 AM

## 2020-10-04 DIAGNOSIS — M1711 Unilateral primary osteoarthritis, right knee: Secondary | ICD-10-CM | POA: Diagnosis not present

## 2020-10-04 MED ORDER — OXYCODONE HCL 5 MG PO TABS: 5.0000 mg | ORAL_TABLET | ORAL | 0 refills | Status: DC | PRN

## 2020-10-04 MED ORDER — CELECOXIB 200 MG PO CAPS: 200.0000 mg | ORAL_CAPSULE | Freq: Two times a day (BID) | ORAL | 0 refills | Status: DC

## 2020-10-04 MED ORDER — ENOXAPARIN SODIUM 40 MG/0.4ML IJ SOSY: 40.0000 mg | PREFILLED_SYRINGE | INTRAMUSCULAR | 0 refills | Status: DC

## 2020-10-04 NOTE — Progress Notes (Signed)
  Subjective: 2 Days Post-Op Procedure(s) (LRB): COMPUTER ASSISTED TOTAL KNEE ARTHROPLASTY (Right) Patient reports pain as well-controlled.   Patient is well, and has had no acute complaints or problems Plan is to go Home after hospital stay. Negative for chest pain and shortness of breath Fever: no Gastrointestinal: negative for nausea and vomiting.  Patient has not had a bowel movement.  Objective: Vital signs in last 24 hours: Temp:  [97.6 F (36.4 C)-98.5 F (36.9 C)] 97.6 F (36.4 C) (07/29 0540) Pulse Rate:  [71-80] 71 (07/29 0540) Resp:  [14-16] 14 (07/29 0540) BP: (102-121)/(64-78) 112/73 (07/29 0540) SpO2:  [94 %-97 %] 94 % (07/29 0540)  Intake/Output from previous day:  Intake/Output Summary (Last 24 hours) at 10/04/2020 0814 Last data filed at 10/04/2020 0552 Gross per 24 hour  Intake 360 ml  Output 60 ml  Net 300 ml    Intake/Output this shift: No intake/output data recorded.  Labs: No results for input(s): HGB in the last 72 hours. No results for input(s): WBC, RBC, HCT, PLT in the last 72 hours. No results for input(s): NA, K, CL, CO2, BUN, CREATININE, GLUCOSE, CALCIUM in the last 72 hours. No results for input(s): LABPT, INR in the last 72 hours.   EXAM General - Patient is Alert, Appropriate, and Oriented Extremity - Neurovascular intact Dorsiflexion/Plantar flexion intact Compartment soft Dressing/Incision -Postoperative dressing remains in place., Polar Care in place and working. , Hemovac in place. , Following removal of post-op dressing, mild sanguinous drainage noted  Motor Function - intact, moving foot and toes well on exam.   Cardiovascular- Regular rate and rhythm, no murmurs/rubs/gallops Respiratory- Lungs clear to auscultation bilaterally Gastrointestinal- soft, nontender, and active bowel sounds   Assessment/Plan: 2 Days Post-Op Procedure(s) (LRB): COMPUTER ASSISTED TOTAL KNEE ARTHROPLASTY (Right) Active Problems:   Total knee  replacement status  Estimated body mass index is 27.8 kg/m as calculated from the following:   Height as of this encounter: '5\' 7"'$  (1.702 m).   Weight as of this encounter: 80.5 kg. Advance diet Up with therapy  Discharge home pending completion of lap around nurse's station   Post-op dressing removed. , Hemovac removed., Mini compression dressing applied. , and Fresh honeycomb dressing applied.   DVT Prophylaxis - Lovenox, Ted hose, and foot pumps Weight-Bearing as tolerated to right leg  Cassell Smiles, PA-C [facility] Orthopaedic Surgery 10/04/2020, 8:14 AM

## 2020-10-04 NOTE — Progress Notes (Signed)
Physical Therapy Treatment Patient Details Name: Alexa Newman MRN: [id number] DOB: 1960/05/14 Today's Date: 10/04/2020    History of Present Illness 60 y/o female s/p R TKA on 10/02/20. 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    PT Comments    Pt received supine in bed, alert and agreeable to therapy. PT and pt formulated goal to ambulate a further distance in this morning's therapy session. Fire alarm sounded at beginning of session. Therex and goni measurements were taken until alarm was cleared. R knee ROM measured at 6 degrees of extension and 84 degrees of flexion. Pt increased ambulation distance from 64f to 658f 2 bouts performed with short seated break between the two due to an increase in pain. Pt did receive pain meds prior to therapy. Pt also demo no LOB or knee buckling during today's ambulation. Close SUP was provided for safety. Pt did state she has been performing exercises in packet. Will attempt to see pt again this afternoon. Would benefit from skilled PT to address above deficits and promote optimal return to PLOF.    Follow Up Recommendations  Home health PT     Equipment Recommendations  Rolling walker with 5" wheels    Recommendations for Other Services       Precautions / Restrictions Restrictions Weight Bearing Restrictions: Yes RLE Weight Bearing: Weight bearing as tolerated LLE Weight Bearing: Weight bearing as tolerated Other Position/Activity Restrictions: Bone foam for knee extension    Mobility  Bed Mobility Overal bed mobility: Modified Independent             General bed mobility comments: increased time with bed rails    Transfers Overall transfer level: Needs assistance Equipment used: Rolling walker (2 wheeled) Transfers: Sit to/from Stand Sit to Stand: Supervision         General transfer comment: good safety  awareness  Ambulation/Gait Ambulation/Gait assistance: Supervision   Assistive device: Rolling walker (2 wheeled) Gait Pattern/deviations: Step-through pattern;Decreased step length - left;Decreased stance time - right;Decreased weight shift to right;Narrow base of support Gait velocity: decreased   General Gait Details: 6069f 2 reps, 1 minute seated rest break between bouts. SUP provided for safety. Distance limited by increased pain.   Stairs     Stair Management:  (with bilateral rails)       Wheelchair Mobility    Modified Rankin (Stroke Patients Only)       Balance Overall balance assessment: Needs assistance Sitting-balance support: No upper extremity supported Sitting balance-Leahy Scale: Normal     Standing balance support: Bilateral upper extremity supported Standing balance-Leahy Scale: Good Standing balance comment: Steady standing balance w/ RW                            Cognition Arousal/Alertness: Awake/alert Behavior During Therapy: WFL for tasks assessed/performed Overall Cognitive Status: Within Functional Limits for tasks assessed                                        Exercises Total Joint Exercises Gluteal Sets: 10 reps;Seated;Strengthening Long Arc Quad: Strengthening;Right;Seated;20 reps Goniometric ROM: R knee: extension 6 degrees, flexion 84 degrees Other Exercises Other Exercises: ROM measured: R knee extension 6 degrees, R knee flexion 84 degrees. Therex performed prior to measurements. Education provided on ROM norms and goals  for future therapy.    General Comments        Pertinent Vitals/Pain Pain Assessment: 0-10 Pain Score: 2  Pain Location: R knee Pain Descriptors / Indicators: Aching Pain Intervention(s): Limited activity within patient's tolerance;Monitored during session;Ice applied    Home Living                      Prior Function            PT Goals (current goals can now  be found in the care plan section) Acute Rehab PT Goals Patient Stated Goal: to return to full mobility PT Goal Formulation: With patient Time For Goal Achievement: 10/17/20 Potential to Achieve Goals: Good    Frequency    BID      PT Plan      Co-evaluation              AM-PAC PT "6 Clicks" Mobility   Outcome Measure  Help needed turning from your back to your side while in a flat bed without using bedrails?: None Help needed moving from lying on your back to sitting on the side of a flat bed without using bedrails?: None Help needed moving to and from a bed to a chair (including a wheelchair)?: A Little Help needed standing up from a chair using your arms (e.g., wheelchair or bedside chair)?: A Little Help needed to walk in hospital room?: A Little Help needed climbing 3-5 steps with a railing? : A Little 6 Click Score: 20    End of Session Equipment Utilized During Treatment: Gait belt Activity Tolerance: Patient tolerated treatment well;Patient limited by pain Patient left: with call bell/phone within reach;in bed;with SCD's reapplied   PT Visit Diagnosis: Unsteadiness on feet (R26.81);Other abnormalities of gait and mobility (R26.89);Muscle weakness (generalized) (M62.81)     Time: [id number] PT Time Calculation (min) (ACUTE ONLY): 25 min  Charges:  $Gait Training: 8-22 mins $Therapeutic Exercise: 8-22 mins                     Patrina Levering PT, DPT 10/04/20 12:45 PM [id number]    Ramonita Lab 10/04/2020, 12:38 PM

## 2020-10-04 NOTE — Discharge Summary (Addendum)
Physician Discharge Summary  Patient ID: Alexa Newman MRN: [id number] DOB/AGE: December 29, 1960 60 y.o.  Admit date: 10/02/2020 Discharge date: 10/05/2020  Admission Diagnoses:  Total knee replacement status [Z96.659]  Surgeries:Procedure(s): Dereck Leep, Jr. M.D.   ASSISTANT: Cassell Smiles, PA-C (present and scrubbed throughout the case, critical for assistance with exposure, retraction, instrumentation, and closure)   ANESTHESIA: spinal   ESTIMATED BLOOD LOSS: 50 mL   FLUIDS REPLACED: 1400 mL of crystalloid   TOURNIQUET TIME: 115 minutes   DRAINS: 2 medium Hemovac drains   SOFT TISSUE RELEASES: Anterior cruciate ligament, posterior cruciate ligament, deep medial collateral ligament, patellofemoral ligament   IMPLANTS UTILIZED: DePuy Attune size 4 posterior stabilized femoral component (cemented), size 4 rotating platform tibial component (cemented), 35 mm medialized dome patella (cemented), and a 5 mm stabilized rotating platform polyethylene insert.  Discharge Diagnoses: Patient Active Problem List   Diagnosis Date Noted   Total knee replacement status 10/02/2020   Bipolar 1 disorder, depressed, full remission ([place]) 06/25/2020   Hyperlipidemia 02/23/2020   Myalgia due to statin 02/23/2020   Bipolar 1 disorder, depressed, mild ([place]) 02/19/2020   GAD (generalized anxiety disorder) 01/22/2020   Chronic pain of right knee 11/27/2019   Fatigue 11/27/2019   Bipolar disorder, in full remission, most recent episode mixed ([place]) 08/09/2019   Prediabetes 04/19/2019   Right wrist pain 04/19/2019   Hypokalemia 01/16/2019   Primary osteoarthritis of right knee 10/21/2018   Overweight 10/17/2018   Bipolar 1 disorder, mixed, moderate ([place]) 10/07/2018   Hx of borderline personality disorder 10/07/2018   Cocaine use disorder, moderate, in sustained remission ([place]) 10/07/2018   Insomnia due to mental condition 10/07/2018   Panic attacks 10/07/2018   Benign neoplasm of ascending colon     Internal hemorrhoids    Polyp of sigmoid colon    Intestinal lump    Encounter for monitoring chronic NSAID therapy 07/13/2017   Tobacco use disorder 07/13/2017   Essential hypertension 07/13/2017   Bipolar 1 disorder ([place])    Anxiety    Arthritis     Past Medical History:  Diagnosis Date   Anxiety    Arthritis    Bipolar 1 disorder ([place])    Depression    Family history of adverse reaction to anesthesia    half sister claims that she would stop breathing during surgery x 2   Hypertension    Osteoarthritis    PONV (postoperative nausea and vomiting)    Pre-diabetes      Transfusion:    Consultants (if any):   Discharged Condition: Improved  Hospital Course: Alexa Newman is an 60 y.o. female who was admitted 10/02/2020 with a diagnosis of right knee osteoarthritis and went to the operating room on 10/02/2020 and underwent right total knee arthroplasty. The patient received perioperative antibiotics for prophylaxis (see below). The patient tolerated the procedure well and was transported to PACU in stable condition. After meeting PACU criteria, the patient was subsequently transferred to the Orthopaedics/Rehabilitation unit.   The patient received DVT prophylaxis in the form of early mobilization, Lovenox, Foot Pumps, and TED hose. A sacral pad had been placed and heels were elevated off of the bed with rolled towels in order to protect skin integrity. Foley catheter was discontinued on postoperative day #0. Wound drains were discontinued on postoperative day #2. The surgical incision was healing well without signs of infection.  Physical therapy was initiated postoperatively for transfers, gait training, and strengthening. Occupational therapy was initiated for activities of daily  living and evaluation for assisted devices. Rehabilitation goals were reviewed in detail with the patient. The patient made steady progress with physical therapy and physical therapy recommended discharge  to Home.   The patient achieved the preliminary goals of this hospitalization and was felt to be medically and orthopaedically appropriate for discharge.  She was given perioperative antibiotics:  Anti-infectives (From admission, onward)    Start     Dose/Rate Route Frequency Ordered Stop   10/02/20 1730  ceFAZolin (ANCEF) IVPB 2g/100 mL premix        2 g 200 mL/hr over 30 Minutes Intravenous Every 6 hours 10/02/20 1604 10/02/20 2347   10/02/20 0927  ceFAZolin (ANCEF) 2-4 GM/100ML-% IVPB       Note to Pharmacy: [place] [facility], [facility]: cabinet override      10/02/20 0927 10/02/20 1149   10/02/20 0830  ceFAZolin (ANCEF) IVPB 2g/100 mL premix        2 g 200 mL/hr over 30 Minutes Intravenous On call to O.R. 10/02/20 [id number] 10/02/20 1154     .  Recent vital signs:  Vitals:   10/05/20 0443 10/05/20 0811  BP: (!) 108/59 117/69  Pulse: 89 84  Resp: 16 15  Temp: 99 F (37.2 C) 98.3 F (36.8 C)  SpO2: 91% 91%    Recent laboratory studies:  No results for input(s): WBC, HGB, HCT, PLT, K, CL, CO2, BUN, CREATININE, GLUCOSE, CALCIUM, LABPT, INR in the last 72 hours.  Diagnostic Studies: DG Knee Right Port  Result Date: 10/02/2020 CLINICAL DATA:  Status post right knee replacement EXAM: PORTABLE RIGHT KNEE - 1-2 VIEW COMPARISON:  07/13/2017 FINDINGS: Right total knee prosthesis is well seated without periprosthetic fracture or lucency. Surgical drain and subcutaneous emphysema consistent with immediate postop status. IMPRESSION: Uncomplicated right total knee prosthesis with immediate postop changes. Electronically Signed   By: Miachel Roux M.D.   On: 10/02/2020 16:16    Discharge Medications:   Allergies as of 10/05/2020       Reactions   Pregabalin Other (See Comments)   Other reaction(s): Other (See Comments) Suicidal ideation        Medication List     STOP taking these medications    meloxicam 15 MG tablet Commonly known as: MOBIC       TAKE these medications     celecoxib 200 MG capsule Commonly known as: CELEBREX Take 1 capsule (200 mg total) by mouth 2 (two) times daily.   enoxaparin 40 MG/0.4ML injection Commonly known as: LOVENOX Inject 0.4 mLs (40 mg total) into the skin daily for 14 days.   lamoTRIgine 150 MG tablet Commonly known as: LAMICTAL Take 1 tablet (150 mg total) by mouth daily.   methocarbamol 500 MG tablet Commonly known as: ROBAXIN Take 500 mg by mouth 2 (two) times daily.   oxyCODONE 5 MG immediate release tablet Commonly known as: Oxy IR/ROXICODONE Take 1 tablet (5 mg total) by mouth every 4 (four) hours as needed for moderate pain (pain score 4-6).   rosuvastatin 5 MG tablet Commonly known as: Crestor Take 1 tablet (5 mg total) by mouth every other day.   Ubiquinol 100 MG Caps Take 100 mg by mouth daily.       ASK your doctor about these medications    amLODipine 10 MG tablet Commonly known as: NORVASC TAKE 1 TABLET BY MOUTH EVERY DAY   Klor-Con M20 20 MEQ tablet Generic drug: potassium chloride SA TAKE 1 TABLET BY MOUTH EVERY DAY   triamterene-hydrochlorothiazide 37.5-25  MG tablet Commonly known as: MAXZIDE-25 TAKE 1 TABLET BY MOUTH EVERY DAY               Durable Medical Equipment  (From admission, onward)           Start     Ordered   10/02/20 1644  DME Walker rolling  Once       Question:  Patient needs a walker to treat with the following condition  Answer:  Total knee replacement status   10/02/20 1643   10/02/20 1644  DME Bedside commode  Once       Question:  Patient needs a bedside commode to treat with the following condition  Answer:  Total knee replacement status   10/02/20 1643            Disposition: Home with home health PT     Follow-up Information     Fausto Skillern, PA-C Follow up on 10/17/2020.   Specialty: Orthopedic Surgery Why: at 1:15pm Contact information: [place] [address] [phone]         Dereck Leep, MD Follow up on 11/19/2020.   Specialty: Orthopedic Surgery Why: at 3:00pm Contact information: [place] [address] [place], PA-C 10/05/2020, 11:23 AM

## 2020-10-04 NOTE — Progress Notes (Signed)
Physical Therapy Treatment Patient Details Name: Alexa Newman MRN: [id number] DOB: [birth year] Today's Date: 10/04/2020    History of Present Illness 60 y/o female s/p R TKA on 10/02/20. 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    PT Comments    Pt received supine in bed, alert and agreeable to therapy. She reports that she is very tired and suspects it could be from the pain meds. She also states that she had a small BM but did not tell the nurse; PT notified LPN. Pt increased ambulation distance this afternoon an additional 41f to total 741f 2 bouts of 7513fere performed. Pt picked 3 exercises from packet to perform at end of session. Active assist was provided for hip abduction and heel slides to support the weight of the leg. Overall, pt is progressing well. Pain continues to increase when WB and during flexion. Would benefit from skilled PT to address above deficits and promote optimal return to PLOF.   Follow Up Recommendations  Home health PT     Equipment Recommendations  Rolling walker with 5" wheels    Recommendations for Other Services       Precautions / Restrictions Restrictions Weight Bearing Restrictions: Yes RLE Weight Bearing: Weight bearing as tolerated Other Position/Activity Restrictions: Bone foam for knee extension    Mobility  Bed Mobility Overal bed mobility: Modified Independent             General bed mobility comments: increased time with bed rails    Transfers Overall transfer level: Needs assistance Equipment used: Rolling walker (2 wheeled) Transfers: Sit to/from Stand Sit to Stand: Supervision         General transfer comment: good safety awareness  Ambulation/Gait Ambulation/Gait assistance: Supervision Gait Distance (Feet): 75 Feet Assistive device: Rolling walker (2 wheeled) Gait Pattern/deviations: Step-through pattern;Decreased step  length - left;Decreased stance time - right;Decreased weight shift to right;Narrow base of support Gait velocity: decreased   General Gait Details: 92f36f2 reps, seated rest break between bouts. Pt reported dizziness during break after looking upwards. SUP provided for safety.   Stairs     Stair Management:  (with bilateral rails)       Wheelchair Mobility    Modified Rankin (Stroke Patients Only)       Balance Overall balance assessment: Needs assistance Sitting-balance support: No upper extremity supported Sitting balance-Leahy Scale: Normal     Standing balance support: Bilateral upper extremity supported Standing balance-Leahy Scale: Good Standing balance comment: Steady standing balance w/ RW                            Cognition Arousal/Alertness: Awake/alert Behavior During Therapy: WFL for tasks assessed/performed Overall Cognitive Status: Within Functional Limits for tasks assessed                                        Exercises Total Joint Exercises Ankle Circles/Pumps: AROM;Both;20 reps Gluteal Sets: 10 reps;Seated;Strengthening Heel Slides: AAROM;Right;20 reps;Supine Hip ABduction/ADduction: AAROM;Right;20 reps;Supine Straight Leg Raises: Strengthening;Right;20 reps;Supine Long Arc Quad: Strengthening;Right;Seated;20 reps Goniometric ROM: R knee: extension 6 degrees, flexion 84 degrees Other Exercises Other Exercises: ROM measured: R knee extension 6 degrees, R knee flexion 84 degrees. Therex performed prior to measurements. Education provided on ROM norms and goals for future therapy.  General Comments        Pertinent Vitals/Pain Pain Assessment: 0-10 Pain Score: 3  Pain Location: R knee Pain Descriptors / Indicators: Aching Pain Intervention(s): Ice applied;Monitored during session;Limited activity within patient's tolerance;Premedicated before session    Home Living                      Prior  Function            PT Goals (current goals can now be found in the care plan section) Acute Rehab PT Goals Patient Stated Goal: to return to full mobility PT Goal Formulation: With patient Time For Goal Achievement: 10/17/20 Potential to Achieve Goals: Good    Frequency    BID      PT Plan      Co-evaluation              AM-PAC PT "6 Clicks" Mobility   Outcome Measure  Help needed turning from your back to your side while in a flat bed without using bedrails?: None Help needed moving from lying on your back to sitting on the side of a flat bed without using bedrails?: None Help needed moving to and from a bed to a chair (including a wheelchair)?: A Little Help needed standing up from a chair using your arms (e.g., wheelchair or bedside chair)?: A Little Help needed to walk in hospital room?: A Little Help needed climbing 3-5 steps with a railing? : A Little 6 Click Score: 20    End of Session Equipment Utilized During Treatment: Gait belt Activity Tolerance: Patient tolerated treatment well;Patient limited by pain Patient left: with call bell/phone within reach;in bed;with SCD's reapplied Nurse Communication: Other (comment) (pt report of BM) PT Visit Diagnosis: Unsteadiness on feet (R26.81);Other abnormalities of gait and mobility (R26.89);Muscle weakness (generalized) (M62.81)     Time: [id number] PT Time Calculation (min) (ACUTE ONLY): 25 min  Charges:  $Gait Training: 8-22 mins $Therapeutic Exercise: 8-22 mins                     Alexa Newman PT, DPT 10/04/20 3:29 PM [id number]    Alexa Newman 10/04/2020, 3:18 PM

## 2020-10-05 DIAGNOSIS — M1711 Unilateral primary osteoarthritis, right knee: Secondary | ICD-10-CM | POA: Diagnosis not present

## 2020-10-05 NOTE — Progress Notes (Signed)
[name] to be D/C'd Home per MD order.  Discussed with the patient and all questions fully answered.  IV catheter discontinued intact. Site without signs and symptoms of complications. Dressing and pressure applied.  An After Visit Summary was printed and given to the patient. Patient received prescriptions and bedside commode, rolling walker.  D/c education completed with patient/family including follow up instructions, medication list, d/c activities limitations if indicated, with other d/c instructions as indicated by MD - patient able to verbalize understanding, all questions fully answered.   Patient instructed to return to ED, call 911, or call MD for any changes in condition.   Patient escorted via [place], and D/C home via private auto.  Manuella Ghazi 10/05/2020 11:19 AM

## 2020-10-05 NOTE — Progress Notes (Signed)
  Subjective: 3 Days Post-Op Procedure(s) (LRB): COMPUTER ASSISTED TOTAL KNEE ARTHROPLASTY (Right) Patient reports pain as moderate.   Patient is well, and has had no acute complaints or problems Plan is to go Home after hospital stay. Negative for chest pain and shortness of breath Fever: no Gastrointestinal: negative for nausea and vomiting.  Patient has had a bowel movement.  Objective: Vital signs in last 24 hours: Temp:  [98.2 F (36.8 C)-99 F (37.2 C)] 98.3 F (36.8 C) (07/30 0811) Pulse Rate:  [78-89] 84 (07/30 0811) Resp:  [15-17] 15 (07/30 0811) BP: (108-119)/(59-73) 117/69 (07/30 0811) SpO2:  [91 %-92 %] 91 % (07/30 0811)  Intake/Output from previous day:  Intake/Output Summary (Last 24 hours) at 10/05/2020 1119 Last data filed at 10/05/2020 0442 Gross per 24 hour  Intake --  Output 1 ml  Net -1 ml    Intake/Output this shift: No intake/output data recorded.  Labs: No results for input(s): HGB in the last 72 hours. No results for input(s): WBC, RBC, HCT, PLT in the last 72 hours. No results for input(s): NA, K, CL, CO2, BUN, CREATININE, GLUCOSE, CALCIUM in the last 72 hours. No results for input(s): LABPT, INR in the last 72 hours.   EXAM General - Patient is Alert, Appropriate, and Oriented Extremity - Neurovascular intact Dorsiflexion/Plantar flexion intact Compartment soft Dressing/Incision -clean, dry, no drainage Motor Function - intact, moving foot and toes well on exam. Able to perform independent SLR.  Cardiovascular- Regular rate and rhythm, no murmurs/rubs/gallops Respiratory- Lungs clear to auscultation bilaterally Gastrointestinal- soft, nontender, and active bowel sounds   Assessment/Plan: 3 Days Post-Op Procedure(s) (LRB): COMPUTER ASSISTED TOTAL KNEE ARTHROPLASTY (Right) Active Problems:   Total knee replacement status  Estimated body mass index is 27.8 kg/m as calculated from the following:   Height as of this encounter: '5\' 7"'$   (1.702 m).   Weight as of this encounter: 80.5 kg. Advance diet Up with therapy Discharge home with home health today pending completion of lap around nursing station.  Given 2 honeycomb dressings.     DVT Prophylaxis - Lovenox, Ted hose, and foot pumps Weight-Bearing as tolerated to right leg  Cassell Smiles, PA-C [facility] Orthopaedic Surgery 10/05/2020, 11:19 AM

## 2020-10-05 NOTE — Progress Notes (Signed)
Physical Therapy Treatment Patient Details Name: Alexa Newman MRN: [id number] DOB: 1960-06-13 Today's Date: 10/05/2020    History of Present Illness 60 y/o female s/p R TKA on 10/02/20. 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    PT Comments    Pt was seen for a shorter visit driven by her need to be helped to BR but too painful for a longer session.  Her nurse was contacted and has polar care in place.  Pt is looking forward to going home, and has worked hard to increase her independence and using safety awareness to be ready.  Follow up later this morning to see again and review her home program.   Follow Up Recommendations  Home health PT     Equipment Recommendations  Rolling walker with 5" wheels    Recommendations for Other Services       Precautions / Restrictions Precautions Precautions: Knee Precaution Booklet Issued: Yes (comment) Restrictions RLE Weight Bearing: Weight bearing as tolerated    Mobility  Bed Mobility Overal bed mobility: Modified Independent;Needs Assistance Bed Mobility: Supine to Sit;Sit to Supine     Supine to sit: Min guard Sit to supine: Modified independent (Device/Increase time)        Transfers Overall transfer level: Needs assistance Equipment used: Rolling walker (2 wheeled) Transfers: Sit to/from Stand Sit to Stand: Supervision;Min guard         General transfer comment: min guard only for lower height in BR  Ambulation/Gait Ambulation/Gait assistance: Supervision Gait Distance (Feet): 30 Feet Assistive device: Rolling walker (2 wheeled) Gait Pattern/deviations: Step-through pattern;Decreased stance time - right     General Gait Details: pt walked a shorter trip since she was waiting for meds, but wanted to get OOB to BR   Stairs             Wheelchair Mobility    Modified Rankin (Stroke Patients Only)        Balance Overall balance assessment: Needs assistance Sitting-balance support: No upper extremity supported Sitting balance-Leahy Scale: Normal     Standing balance support: Bilateral upper extremity supported Standing balance-Leahy Scale: Fair                              Cognition Arousal/Alertness: Awake/alert Behavior During Therapy: WFL for tasks assessed/performed Overall Cognitive Status: Within Functional Limits for tasks assessed                                        Exercises      General Comments General comments (skin integrity, edema, etc.): Pt was assisted to get OOB with RLE minimally and then needed no help to get back, very capable and motivated to get home      Pertinent Vitals/Pain Pain Assessment: Faces Faces Pain Scale: Hurts whole lot Pain Location: R knee Pain Descriptors / Indicators: Operative site guarding;Grimacing Pain Intervention(s): Monitored during session;Repositioned;Patient requesting pain meds-RN notified    Home Living                      Prior Function            PT Goals (current goals can now be found in the care plan section) Acute Rehab PT Goals Patient Stated Goal: to return home today Progress  towards PT goals: Progressing toward goals    Frequency    BID      PT Plan Current plan remains appropriate    Co-evaluation              AM-PAC PT "6 Clicks" Mobility   Outcome Measure  Help needed turning from your back to your side while in a flat bed without using bedrails?: None Help needed moving from lying on your back to sitting on the side of a flat bed without using bedrails?: A Little Help needed moving to and from a bed to a chair (including a wheelchair)?: A Little Help needed standing up from a chair using your arms (e.g., wheelchair or bedside chair)?: A Little Help needed to walk in hospital room?: A Little Help needed climbing 3-5 steps with a railing? : A  Little 6 Click Score: 19    End of Session Equipment Utilized During Treatment: Gait belt Activity Tolerance: Patient tolerated treatment well;Patient limited by pain Patient left: with call bell/phone within reach;in bed;with SCD's reapplied Nurse Communication: Other (comment) PT Visit Diagnosis: Unsteadiness on feet (R26.81);Other abnormalities of gait and mobility (R26.89);Muscle weakness (generalized) (M62.81)     Time: [id number] PT Time Calculation (min) (ACUTE ONLY): 13 min  Charges:  $Gait Training: 8-22 mins              Ramond Dial 10/05/2020, 9:23 AM  Mee Hives, PT MS Acute Rehab Dept. Number: [place] [facility] [place]

## 2020-10-07 ENCOUNTER — Telehealth: Payer: Self-pay

## 2020-10-07 NOTE — Telephone Encounter (Signed)
Transition Care Management Follow-up Telephone Call Date of discharge and from where: 10/05/2020-[facility] How have you been since you were released from the hospital? Patient stated she is doing fine.  Any questions or concerns? No  Items Reviewed: Did the pt receive and understand the discharge instructions provided? Yes  Medications obtained and verified? Yes  Other? No  Any new allergies since your discharge? No  Dietary orders reviewed? N/A Do you have support at home? Yes   Home Care and Equipment/Supplies: Were home health services ordered? not applicable If so, what is the name of the agency? N/A  Has the agency set up a time to come to the patient's home? not applicable Were any new equipment or medical supplies ordered?  No What is the name of the medical supply agency? N/A Were you able to get the supplies/equipment? not applicable Do you have any questions related to the use of the equipment or supplies? No  Functional Questionnaire: (I = Independent and D = Dependent) ADLs: I  Bathing/Dressing- I  Meal Prep- I  Eating- I  Maintaining continence- I  Transferring/Ambulation- I  Managing Meds- I  Follow up appointments reviewed:  PCP Hospital f/u appt confirmed? Yes  Scheduled to see Dr. Brita Romp on 12/02/2020 @ 2:40 pm. [facility] f/u appt confirmed? Yes  Scheduled to see Tamala Julian, PA on 10/17/2020 @ 1:15 PM. Are transportation arrangements needed? No  If their condition worsens, is the pt aware to call PCP or go to the Emergency Dept.? Yes Was the patient provided with contact information for the PCP's office or ED? Yes Was to pt encouraged to call back with questions or concerns? Yes

## 2020-10-17 DIAGNOSIS — M25461 Effusion, right knee: Secondary | ICD-10-CM | POA: Diagnosis not present

## 2020-10-17 DIAGNOSIS — M25561 Pain in right knee: Secondary | ICD-10-CM | POA: Diagnosis not present

## 2020-10-21 DIAGNOSIS — M25461 Effusion, right knee: Secondary | ICD-10-CM | POA: Diagnosis not present

## 2020-10-21 DIAGNOSIS — M25561 Pain in right knee: Secondary | ICD-10-CM | POA: Diagnosis not present

## 2020-10-28 DIAGNOSIS — M25561 Pain in right knee: Secondary | ICD-10-CM | POA: Diagnosis not present

## 2020-10-28 DIAGNOSIS — M25461 Effusion, right knee: Secondary | ICD-10-CM | POA: Diagnosis not present

## 2020-10-31 DIAGNOSIS — M25461 Effusion, right knee: Secondary | ICD-10-CM | POA: Diagnosis not present

## 2020-10-31 DIAGNOSIS — M25561 Pain in right knee: Secondary | ICD-10-CM | POA: Diagnosis not present

## 2020-11-12 ENCOUNTER — Other Ambulatory Visit: Payer: Self-pay | Admitting: Family Medicine

## 2020-11-13 NOTE — Telephone Encounter (Signed)
Valid encounter . Future visit in 2 weeks

## 2020-11-26 DIAGNOSIS — M25461 Effusion, right knee: Secondary | ICD-10-CM | POA: Diagnosis not present

## 2020-11-26 DIAGNOSIS — M25561 Pain in right knee: Secondary | ICD-10-CM | POA: Diagnosis not present

## 2020-12-02 ENCOUNTER — Other Ambulatory Visit: Payer: Self-pay

## 2020-12-02 ENCOUNTER — Ambulatory Visit (INDEPENDENT_AMBULATORY_CARE_PROVIDER_SITE_OTHER): Payer: Medicaid Other | Admitting: Family Medicine

## 2020-12-02 ENCOUNTER — Ambulatory Visit
Admission: RE | Admit: 2020-12-02 | Discharge: 2020-12-02 | Disposition: A | Discharge status: Home / Self Care | Payer: Medicaid Other | Attending: Family Medicine | Admitting: Family Medicine

## 2020-12-02 ENCOUNTER — Encounter: Payer: Self-pay | Admitting: Family Medicine

## 2020-12-02 ENCOUNTER — Ambulatory Visit
Admission: RE | Admit: 2020-12-02 | Discharge: 2020-12-02 | Disposition: A | Discharge status: Home / Self Care | Payer: Medicaid Other | Source: Ambulatory Visit | Attending: Family Medicine | Admitting: Family Medicine

## 2020-12-02 VITALS — BP 136/83 | HR 102 | Temp 98.8°F | Ht 64.0 in | Wt 175.6 lb

## 2020-12-02 DIAGNOSIS — Z0000 Encounter for general adult medical examination without abnormal findings: Secondary | ICD-10-CM

## 2020-12-02 DIAGNOSIS — F319 Bipolar disorder, unspecified: Secondary | ICD-10-CM

## 2020-12-02 DIAGNOSIS — M25552 Pain in left hip: Secondary | ICD-10-CM

## 2020-12-02 DIAGNOSIS — R7303 Prediabetes: Secondary | ICD-10-CM

## 2020-12-02 DIAGNOSIS — E669 Obesity, unspecified: Secondary | ICD-10-CM

## 2020-12-02 DIAGNOSIS — E782 Mixed hyperlipidemia: Secondary | ICD-10-CM | POA: Diagnosis not present

## 2020-12-02 DIAGNOSIS — R5382 Chronic fatigue, unspecified: Secondary | ICD-10-CM | POA: Diagnosis not present

## 2020-12-02 DIAGNOSIS — Z79899 Other long term (current) drug therapy: Secondary | ICD-10-CM

## 2020-12-02 DIAGNOSIS — I10 Essential (primary) hypertension: Secondary | ICD-10-CM | POA: Diagnosis not present

## 2020-12-02 DIAGNOSIS — Z23 Encounter for immunization: Secondary | ICD-10-CM | POA: Diagnosis not present

## 2020-12-02 DIAGNOSIS — Z6830 Body mass index (BMI) 30.0-30.9, adult: Secondary | ICD-10-CM | POA: Diagnosis not present

## 2020-12-02 DIAGNOSIS — F411 Generalized anxiety disorder: Secondary | ICD-10-CM

## 2020-12-02 LAB — POCT GLYCOSYLATED HEMOGLOBIN (HGB A1C): Hemoglobin A1C: 5.2 % (ref 4.0–5.6)

## 2020-12-02 NOTE — Assessment & Plan Note (Signed)
Worsening recently Will check labs for possible underlying etiologies

## 2020-12-02 NOTE — Assessment & Plan Note (Signed)
Previously well controlled Continue statin Repeat FLP and CMP  

## 2020-12-02 NOTE — Assessment & Plan Note (Signed)
F/b psych No changes to current meds

## 2020-12-02 NOTE — Assessment & Plan Note (Signed)
Well controlled Continue current medications Recheck metabolic panel F/u in 6 months  

## 2020-12-02 NOTE — Assessment & Plan Note (Signed)
Recommend low carb diet °Recheck A1c  °

## 2020-12-02 NOTE — Assessment & Plan Note (Signed)
Discussed importance of healthy weight management Discussed diet and exercise  

## 2020-12-02 NOTE — Assessment & Plan Note (Signed)
Chronic x6-12 months Suspect OA, but will get XRay to confirm Advised NSAIDs prn, ice Consider Ortho referral pending results

## 2020-12-02 NOTE — Progress Notes (Signed)
Complete physical exam   Patient: Alexa Newman   DOB: 1960/11/10   60 y.o. Female  MRN: [id number] Visit Date: 12/02/2020  Today's healthcare provider: [name], MD   Chief Complaint  Patient presents with   Annual Exam   Subjective    Alexa Newman is a 60 y.o. female who presents today for a complete physical exam.  She reports consuming a general diet.  Patient reports doing PT for her right knee and stretching daily for 20 minutes.  She generally feels well. She reports sleeping well. She does have additional problems to discuss today.   HPI  Patient reports that at night her left foot swells and gets red. Elevating it does not seem to help it. Hip is painful, describes as a sharp pain intermittently x6-12 months. Worse with walking. Fine while sitting.  Intense fatigue since surgery 2 months   Hypertension, follow-up  BP Readings from Last 3 Encounters:  12/02/20 136/83  10/05/20 117/69  09/24/20 128/80   Wt Readings from Last 3 Encounters:  12/02/20 175 lb 9.6 oz (79.7 kg)  10/02/20 177 lb 7.5 oz (80.5 kg)  09/24/20 173 lb 8 oz (78.7 kg)     She was last seen for hypertension 6 months ago.  BP at that visit was 126/78. Management since that visit includes continue current medications.  She reports good compliance with treatment. She is not having side effects.  She is following a Regular diet. She is exercising. She does smoke.  Use of agents associated with hypertension: none.   Outside blood pressures are 130/90 (give or take a few). Symptoms: No chest pain No chest pressure  No palpitations No syncope  No dyspnea No orthopnea  No paroxysmal nocturnal dyspnea Yes lower extremity edema   Pertinent labs: Lab Results  Component Value Date   CHOL 155 05/27/2020   HDL 56 05/27/2020   LDLCALC 77 05/27/2020   TRIG 126 05/27/2020   CHOLHDL 2.8 05/27/2020   Lab Results  Component Value Date   NA 138 09/24/2020   K 3.3 (L) 09/24/2020    CREATININE 0.77 09/24/2020   EGFR 101 05/27/2020   GFRNONAA >60 09/24/2020   GLUCOSE 106 (H) 09/24/2020     The 10-year ASCVD risk score (Arnett DK, et al., 2019) is: 7.7%   ------------------------------------------------------------------------------------------- Lipid/Cholesterol, Follow-up  Last lipid panel Other pertinent labs  Lab Results  Component Value Date   CHOL 155 05/27/2020   HDL 56 05/27/2020   LDLCALC 77 05/27/2020   TRIG 126 05/27/2020   CHOLHDL 2.8 05/27/2020   Lab Results  Component Value Date   ALT 15 09/24/2020   AST 17 09/24/2020   PLT 259 09/24/2020   TSH 0.983 11/27/2019     She was last seen for this 6 months ago.  Management since that visit includes continue coQ10 and crestor.  She reports good compliance with treatment. She is not having side effects.   Symptoms: No chest pain No chest pressure/discomfort  No dyspnea Yes lower extremity edema  No numbness or tingling of extremity No orthopnea  No palpitations No paroxysmal nocturnal dyspnea  No speech difficulty No syncope   Current diet: in general, a "healthy" diet   Current exercise:  stretching and PT for knee  The 10-year ASCVD risk score (Arnett DK, et al., 2019) is: 7.7%  ---------------------------------------------------------------------------------------------------  Prediabetes, Follow-up  Lab Results  Component Value Date   HGBA1C 5.2 12/02/2020   HGBA1C 5.8 (A) 05/27/2020  HGBA1C 5.6 11/27/2019   GLUCOSE 106 (H) 09/24/2020   GLUCOSE 100 (H) 05/27/2020   GLUCOSE 87 11/27/2019    Last seen for for this6 months ago.  Management since that visit includes low carb diet. Current symptoms include none and have been stable.  Prior visit with dietician: no Current diet: not asked Current exercise: none  Pertinent Labs:    Component Value Date/Time   CHOL 155 05/27/2020 1037   TRIG 126 05/27/2020 1037   CHOLHDL 2.8 05/27/2020 1037   CREATININE 0.77 09/24/2020  1431    Wt Readings from Last 3 Encounters:  12/02/20 175 lb 9.6 oz (79.7 kg)  10/02/20 177 lb 7.5 oz (80.5 kg)  09/24/20 173 lb 8 oz (78.7 kg)    ----------------------------------------------------------------------------------------- --------   Past Medical History:  Diagnosis Date   Anxiety    Arthritis    Bipolar 1 disorder (HCC)    Depression    Family history of adverse reaction to anesthesia    half sister claims that she would stop breathing during surgery x 2   Hypertension    Osteoarthritis    PONV (postoperative nausea and vomiting)    Pre-diabetes    Past Surgical History:  Procedure Laterality Date   ANTERIOR CRUCIATE LIGAMENT (ACL) REVISION Right    CARPAL TUNNEL RELEASE     COLONOSCOPY     COLONOSCOPY WITH PROPOFOL N/A 11/17/2017   Procedure: COLONOSCOPY WITH PROPOFOL;  Surgeon: Virgel Manifold, MD;  Location: [facility] ENDOSCOPY;  Service: Endoscopy;  Laterality: N/A;   DILATION AND CURETTAGE OF UTERUS  2013   for heavy bleeding   ENDOMETRIAL ABLATION  2013   KNEE ARTHROPLASTY Right 10/02/2020   Procedure: COMPUTER ASSISTED TOTAL KNEE ARTHROPLASTY;  Surgeon: Dereck Leep, MD;  Location: [facility] ORS;  Service: Orthopedics;  Laterality: Right;   right wrist surgery     TUBAL LIGATION     WRIST ARTHROSCOPY Left 1990   for degenerative changes   Social History   Socioeconomic History   Marital status: Divorced    Spouse name: Not on file   Number of children: 1   Years of education: Not on file   Highest education level: Some college, no degree  Occupational History   Occupation: disabled    Comment: on SSI awaiting decision about disability  Tobacco Use   Smoking status: Every Day    Packs/day: 0.50    Years: 45.00    Pack years: 22.50    Types: Cigarettes   Smokeless tobacco: Never   Tobacco comments:    started smoking at age 13  Vaping Use   Vaping Use: Former  Substance and Sexual Activity   Alcohol use: Yes    Comment: 1 times per  month   Drug use: Not Currently    Comment: former crack cocaine user   Sexual activity: Yes    Partners: Male    Birth control/protection: Post-menopausal, Surgical  Other Topics Concern   Not on file  Social History Narrative   Live with brother and his husband   Social Determinants of Health   Financial Resource Strain: Not on file  Food Insecurity: Not on file  Transportation Needs: Not on file  Physical Activity: Not on file  Stress: Not on file  Social Connections: Not on file  Intimate Partner Violence: Not on file   Family Status  Relation Name Status   Mother  Deceased   Father  Deceased   Sister  Alive   Brother  Alive   Sister  Alive   Sister  Alive   Sister  Alive   Brother  Alive   [employer]  (Not Specified)   Neg Hx  (Not Specified)   Family History  Problem Relation Age of Onset   Diabetes Mother    Depression Mother    Hypertension Mother    Crohn's disease Mother    Lung cancer Mother 78       former smoker   Congestive Heart Failure Father    Breast cancer Sister 91   Alcohol abuse Sister    Drug abuse Sister    Anxiety disorder Sister    Depression Sister    Bipolar disorder Sister    Alcohol abuse Brother    Drug abuse Brother    Anxiety disorder Brother    Depression Brother    Colon polyps Brother    HIV Brother    Alcohol abuse Brother    Drug abuse Brother    Anxiety disorder Brother    Depression Brother    Colon polyps Brother    HIV Brother    Skin cancer Maternal Grandfather    Colon cancer Neg Hx    Ovarian cancer Neg Hx    Cervical cancer Neg Hx    Allergies  Allergen Reactions   Pregabalin Other (See Comments)    Other reaction(s): Other (See Comments) Suicidal ideation     Patient Care Team: Virginia Crews, MD as PCP - General (Family Medicine)   Medications: Outpatient Medications Prior to Visit  Medication Sig   amLODipine (NORVASC) 10 MG tablet TAKE 1 TABLET BY MOUTH EVERY DAY (Patient taking  differently: Take 10 mg by mouth daily.)   KLOR-CON M20 20 MEQ tablet TAKE 1 TABLET BY MOUTH EVERY DAY (Patient taking differently: Take 20 mEq by mouth daily.)   lamoTRIgine (LAMICTAL) 150 MG tablet Take 1 tablet (150 mg total) by mouth daily.   meloxicam (MOBIC) 15 MG tablet Take 15 mg by mouth daily.   methocarbamol (ROBAXIN) 500 MG tablet Take 500 mg by mouth 2 (two) times daily.   rosuvastatin (CRESTOR) 5 MG tablet Take 1 tablet (5 mg total) by mouth every other day.   triamterene-hydrochlorothiazide (MAXZIDE-25) 37.5-25 MG tablet TAKE 1 TABLET BY MOUTH EVERY DAY   Ubiquinol 100 MG CAPS Take 100 mg by mouth daily.   [DISCONTINUED] celecoxib (CELEBREX) 200 MG capsule Take 1 capsule (200 mg total) by mouth 2 (two) times daily.   [DISCONTINUED] enoxaparin (LOVENOX) 40 MG/0.4ML injection Inject 0.4 mLs (40 mg total) into the skin daily for 14 days.   [DISCONTINUED] oxyCODONE (OXY IR/ROXICODONE) 5 MG immediate release tablet Take 1 tablet (5 mg total) by mouth every 4 (four) hours as needed for moderate pain (pain score 4-6).   No facility-administered medications prior to visit.    Review of Systems  Constitutional:  Positive for fatigue.  Cardiovascular:  Positive for leg swelling.  Musculoskeletal:  Positive for back pain.  Neurological:  Positive for speech difficulty and numbness.  All other systems reviewed and are negative.  Last CBC Lab Results  Component Value Date   WBC 8.3 09/24/2020   HGB 13.0 09/24/2020   HCT 40.3 09/24/2020   MCV 82.2 09/24/2020   MCH 26.5 09/24/2020   RDW 13.5 09/24/2020   PLT 259 37/62/8315   Last metabolic panel Lab Results  Component Value Date   GLUCOSE 106 (H) 09/24/2020   NA 138 09/24/2020   K 3.3 (L) 09/24/2020  CL 104 09/24/2020   CO2 27 09/24/2020   BUN 18 09/24/2020   CREATININE 0.77 09/24/2020   EGFR 101 05/27/2020   GFRNONAA >60 09/24/2020   CALCIUM 9.5 09/24/2020   PHOS 4.4 (H) 01/16/2019   PROT 7.3 09/24/2020   ALBUMIN 4.0  09/24/2020   LABGLOB 2.4 05/27/2020   AGRATIO 2.0 05/27/2020   BILITOT 0.6 09/24/2020   ALKPHOS 80 09/24/2020   AST 17 09/24/2020   ALT 15 09/24/2020   ANIONGAP 7 09/24/2020   Last lipids Lab Results  Component Value Date   CHOL 155 05/27/2020   HDL 56 05/27/2020   LDLCALC 77 05/27/2020   TRIG 126 05/27/2020   CHOLHDL 2.8 05/27/2020   Last hemoglobin A1c Lab Results  Component Value Date   HGBA1C 5.2 12/02/2020   Last thyroid functions Lab Results  Component Value Date   TSH 0.983 11/27/2019   Last vitamin D No results found for: 25OHVITD2, 25OHVITD3, VD25OH Last vitamin B12 and Folate No results found for: VITAMINB12, FOLATE    Objective    BP 136/83 (BP Location: Left Arm, Patient Position: Sitting, Cuff Size: Large)   Pulse (!) 102   Temp 98.8 F (37.1 C) (Oral)   Ht '5\' 4"'  (1.626 m)   Wt 175 lb 9.6 oz (79.7 kg)   LMP 12/07/2011   SpO2 98%   BMI 30.14 kg/m    Physical Exam Vitals reviewed.  Constitutional:      General: She is not in acute distress.    Appearance: Normal appearance. She is well-developed. She is not diaphoretic.  HENT:     Head: Normocephalic and atraumatic.     Right Ear: Tympanic membrane, ear canal and external ear normal.     Left Ear: Tympanic membrane, ear canal and external ear normal.     Nose: Nose normal.     Mouth/Throat:     Mouth: Mucous membranes are moist.     Pharynx: Oropharynx is clear. No oropharyngeal exudate.  Eyes:     General: No scleral icterus.    Conjunctiva/sclera: Conjunctivae normal.     Pupils: Pupils are equal, round, and reactive to light.  Neck:     Thyroid: No thyromegaly.  Cardiovascular:     Rate and Rhythm: Normal rate and regular rhythm.     Pulses: Normal pulses.     Heart sounds: Normal heart sounds. No murmur heard. Pulmonary:     Effort: Pulmonary effort is normal. No respiratory distress.     Breath sounds: Normal breath sounds. No wheezing or rales.  Abdominal:     General: There  is no distension.     Palpations: Abdomen is soft.     Tenderness: There is no abdominal tenderness.  Musculoskeletal:        General: No deformity.     Cervical back: Neck supple.     Right lower leg: No edema.     Left lower leg: No edema.     Comments: L Hip: No TTP, Normal ROM, no pain with internal or external rotation. Strength intact.  Lymphadenopathy:     Cervical: No cervical adenopathy.  Skin:    General: Skin is warm and dry.     Findings: No rash.  Neurological:     Mental Status: She is alert and oriented to person, place, and time. Mental status is at baseline.     Sensory: No sensory deficit.     Motor: No weakness.     Gait: Gait normal.  Psychiatric:  Mood and Affect: Mood normal.        Behavior: Behavior normal.        Thought Content: Thought content normal.      Last depression screening scores PHQ 2/9 Scores 12/02/2020 02/23/2020 11/27/2019  PHQ - 2 Score 0 2 1  PHQ- 9 Score '2 8 3  ' Some encounter information is confidential and restricted. Go to Review Flowsheets activity to see all data.   Last fall risk screening Fall Risk  12/02/2020  Falls in the past year? 0  Number falls in past yr: 0  Injury with Fall? 0  Risk for fall due to : No Fall Risks  Follow up -   Last Audit-C alcohol use screening Alcohol Use Disorder Test (AUDIT) 12/02/2020  1. How often do you have a drink containing alcohol? 1  2. How many drinks containing alcohol do you have on a typical day when you are drinking? 0  3. How often do you have six or more drinks on one occasion? 0  AUDIT-C Score 1  Alcohol Brief Interventions/Follow-up -   A score of 3 or more in women, and 4 or more in men indicates increased risk for alcohol abuse, EXCEPT if all of the points are from question 1   Results for orders placed or performed in visit on 12/02/20  POCT HgB A1C  Result Value Ref Range   Hemoglobin A1C 5.2 4.0 - 5.6 %    Assessment & Plan    Routine Health Maintenance and  Physical Exam  Exercise Activities and Dietary recommendations  Goals   None     Immunization History  Administered Date(s) Administered   Hep A / Hep B 11/26/2006, 01/21/2007, 12/20/2008   Influenza,inj,Quad PF,6+ Mos 01/13/2018, 11/17/2018, 11/27/2019, 12/02/2020   Moderna Sars-Covid-2 Vaccination 05/31/2019, 06/28/2019, 01/26/2020   Tdap 01/21/2007, 09/20/2009, 11/27/2019    Health Maintenance  Topic Date Due   Zoster Vaccines- Shingrix (1 of 2) Never done   COVID-19 Vaccine (4 - Booster for Moderna series) 04/19/2020   MAMMOGRAM  05/11/2022   PAP SMEAR-Modifier  10/14/2022   COLONOSCOPY (Pts 45-41yr Insurance coverage will need to be confirmed)  11/18/2022   TETANUS/TDAP  11/26/2029   INFLUENZA VACCINE  Completed   Hepatitis C Screening  Completed   HIV Screening  Completed   HPV VACCINES  Aged Out    Discussed health benefits of physical activity, and encouraged her to engage in regular exercise appropriate for her age and condition.  Problem List Items Addressed This Visit       Cardiovascular and Mediastinum   Essential hypertension    Well controlled Continue current medications Recheck metabolic panel F/u in 6 months       Relevant Orders   Comprehensive metabolic panel     Other   B[place] 1 disorder ([place]    F/b psych Continue current meds      Obesity    Discussed importance of healthy weight management Discussed diet and exercise       Prediabetes    Recommend low carb diet Recheck A1c      Relevant Orders   POCT HgB A1C (Completed)   Fatigue    Worsening recently Will check labs for possible underlying etiologies      Relevant Orders   CBC   VITAMIN D 25 Hydroxy (Vit-D Deficiency, Fractures)   B12   TSH   GAD (generalized anxiety disorder)    F/b psych No changes to current meds  Hyperlipidemia    Previously well controlled Continue statin Repeat FLP and CMP      Relevant Orders   Lipid panel   Comprehensive  metabolic panel   Left hip pain    Chronic x6-12 months Suspect OA, but will get XRay to confirm Advised NSAIDs prn, ice Consider Ortho referral pending results      Relevant Orders   DG Hip Unilat W OR W/O Pelvis 2-3 Views Left   Other Visit Diagnoses     Encounter for annual physical exam    -  Primary   Relevant Orders   Lipid panel   Comprehensive metabolic panel   CBC   VITAMIN D 25 Hydroxy (Vit-D Deficiency, Fractures)   B12   TSH   Long-term use of high-risk medication       Relevant Orders   Lipid panel   Comprehensive metabolic panel   CBC   VITAMIN D 25 Hydroxy (Vit-D Deficiency, Fractures)   B12   TSH   Flu vaccine need       Relevant Orders   Flu Vaccine QUAD 6+ mos PF IM (Fluarix Quad PF) (Completed)        Return in about 6 months (around 06/01/2021) for chronic disease f/u.     I, [name], MD, have reviewed all documentation for this visit. The documentation on 12/02/20 for the exam, diagnosis, procedures, and orders are all accurate and complete.   Naylee Frankowski, Dionne Bucy, MD, MPH [facility]

## 2020-12-02 NOTE — Patient Instructions (Signed)
   The CDC recommends two doses of Shingrix (the shingles vaccine) separated by 2 to 6 months for adults age 59 years and older. I recommend checking with your insurance plan regarding coverage for this vaccine.   

## 2020-12-02 NOTE — Assessment & Plan Note (Signed)
F/b psych Continue current meds

## 2020-12-03 ENCOUNTER — Other Ambulatory Visit: Payer: Self-pay

## 2020-12-03 DIAGNOSIS — E860 Dehydration: Secondary | ICD-10-CM

## 2020-12-03 DIAGNOSIS — E538 Deficiency of other specified B group vitamins: Secondary | ICD-10-CM

## 2020-12-03 LAB — COMPREHENSIVE METABOLIC PANEL
ALT: 15 IU/L (ref 0–32)
AST: 17 IU/L (ref 0–40)
Albumin/Globulin Ratio: 2 (ref 1.2–2.2)
Albumin: 4.8 g/dL (ref 3.8–4.9)
Alkaline Phosphatase: 130 IU/L — ABNORMAL HIGH (ref 44–121)
BUN/Creatinine Ratio: 21 (ref 9–23)
BUN: 21 mg/dL (ref 6–24)
Bilirubin Total: <0.2 mg/dL (ref 0.0–1.2)
CO2: 22 mmol/L (ref 20–29)
Calcium: 10.4 mg/dL — ABNORMAL HIGH (ref 8.7–10.2)
Chloride: 101 mmol/L (ref 96–106)
Creatinine, Ser: 1.01 mg/dL — ABNORMAL HIGH (ref 0.57–1.00)
Globulin, Total: 2.4 g/dL (ref 1.5–4.5)
Glucose: 97 mg/dL (ref 70–99)
Potassium: 4 mmol/L (ref 3.5–5.2)
Sodium: 142 mmol/L (ref 134–144)
Total Protein: 7.2 g/dL (ref 6.0–8.5)
eGFR: 64 mL/min/{1.73_m2} (ref >59)

## 2020-12-03 LAB — CBC
Hematocrit: 41.8 % (ref 34.0–46.6)
Hemoglobin: 13.3 g/dL (ref 11.1–15.9)
MCH: 25.2 pg — ABNORMAL LOW (ref 26.6–33.0)
MCHC: 31.8 g/dL (ref 31.5–35.7)
MCV: 79 fL (ref 79–97)
Platelets: 351 10*3/uL (ref 150–450)
RBC: 5.28 x10E6/uL (ref 3.77–5.28)
RDW: 12.7 % (ref 11.7–15.4)
WBC: 10.5 10*3/uL (ref 3.4–10.8)

## 2020-12-03 LAB — LIPID PANEL
Chol/HDL Ratio: 2.6 ratio (ref 0.0–4.4)
Cholesterol, Total: 154 mg/dL (ref 100–199)
HDL: 59 mg/dL (ref >39)
LDL Chol Calc (NIH): 68 mg/dL (ref 0–99)
Triglycerides: 161 mg/dL — ABNORMAL HIGH (ref 0–149)
VLDL Cholesterol Cal: 27 mg/dL (ref 5–40)

## 2020-12-03 LAB — TSH: TSH: 0.801 u[IU]/mL (ref 0.450–4.500)

## 2020-12-03 LAB — VITAMIN B12: Vitamin B-12: 165 pg/mL — ABNORMAL LOW (ref 232–1245)

## 2020-12-03 LAB — VITAMIN D 25 HYDROXY (VIT D DEFICIENCY, FRACTURES): Vit D, 25-Hydroxy: 36.3 ng/mL (ref 30.0–100.0)

## 2020-12-05 ENCOUNTER — Other Ambulatory Visit: Payer: Self-pay

## 2020-12-05 DIAGNOSIS — M25552 Pain in left hip: Secondary | ICD-10-CM

## 2020-12-13 DIAGNOSIS — M25552 Pain in left hip: Secondary | ICD-10-CM | POA: Diagnosis not present

## 2020-12-13 DIAGNOSIS — M545 Low back pain, unspecified: Secondary | ICD-10-CM | POA: Diagnosis not present

## 2020-12-16 ENCOUNTER — Telehealth (INDEPENDENT_AMBULATORY_CARE_PROVIDER_SITE_OTHER): Payer: Medicaid Other | Admitting: Psychiatry

## 2020-12-16 ENCOUNTER — Other Ambulatory Visit: Payer: Self-pay

## 2020-12-16 ENCOUNTER — Encounter: Payer: Self-pay | Admitting: Psychiatry

## 2020-12-16 DIAGNOSIS — Z8659 Personal history of other mental and behavioral disorders: Secondary | ICD-10-CM

## 2020-12-16 DIAGNOSIS — F1421 Cocaine dependence, in remission: Secondary | ICD-10-CM

## 2020-12-16 DIAGNOSIS — F3111 Bipolar disorder, current episode manic without psychotic features, mild: Secondary | ICD-10-CM | POA: Diagnosis not present

## 2020-12-16 DIAGNOSIS — F5105 Insomnia due to other mental disorder: Secondary | ICD-10-CM

## 2020-12-16 DIAGNOSIS — F411 Generalized anxiety disorder: Secondary | ICD-10-CM

## 2020-12-16 DIAGNOSIS — F17200 Nicotine dependence, unspecified, uncomplicated: Secondary | ICD-10-CM | POA: Diagnosis not present

## 2020-12-16 NOTE — Progress Notes (Signed)
Virtual Visit via Video Note  I connected with Alexa Newman on 12/16/20 at 10:20 AM EDT by a video enabled telemedicine application and verified that I am speaking with the correct person using two identifiers.  Location Provider Location : [facility] Patient Location : Car  Participants: Patient , Provider    I discussed the limitations of evaluation and management by telemedicine and the availability of in person appointments. The patient expressed understanding and agreed to proceed.  I discussed the assessment and treatment plan with the patient. The patient was provided an opportunity to ask questions and all were answered. The patient agreed with the plan and demonstrated an understanding of the instructions.   The patient was advised to call back or seek an in-person evaluation if the symptoms worsen or if the condition fails to improve as anticipated.    [place] MD OP Progress Note  12/16/2020 12:58 PM Alexa Newman  MRN:  [id number]  Chief Complaint:  Chief Complaint   Follow-up; Anxiety; Manic Behavior    HPI: Alexa Newman is a 60 year old Caucasian female, divorced, on SSI, lives in [place], has a history of bipolar disorder, borderline personality disorder, cocaine use disorder, GAD, insomnia, tobacco use disorder, was evaluated by telemedicine today.  Patient today reports she is likely manic however reports this may have started today and usually it lasts only a day or so.  She describes her symptoms as having racing thoughts, talking too fast and not being able to find the right words since her thoughts are too fast.  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  Her daughter is very good about monitoring her and she agrees to get help if she needs to.  She also reports her daughter is also very good about getting her help if needed.  However currently she does not want to make any medication changes since whenever this  happens it does not last more than a day.  She is currently on Lamictal as prescribed.  Patient reports sleep is good.  Patient denies any suicidality, homicidality.  Patient reports history of seeing things out of the corner of her eyes or hearing things in the past.  Most recently she reports she has been seeing something out of the corner of her eyes only when she is at her daughter's business.  She reports she is able to cope and it does not bother her.  She is interested in quitting smoking.  She is aware about [facility] quit now program.  She reports she and her daughter are going to quit together, has not set up a date yet however likely in a week.  Visit Diagnosis:    ICD-10-CM   1. Bipolar 1 disorder, manic, mild (HCC)  F31.11     2. GAD (generalized anxiety disorder)  F41.1     3. Insomnia due to mental condition  F51.05     4. Tobacco use disorder  F17.200     5. Hx of borderline personality disorder  Z86.59     6. Cocaine use disorder, moderate, in sustained remission ([place])  F14.21       Past Psychiatric History: Reviewed past psychiatric history from progress note on 07/27/2017  Past Medical History:  Past Medical History:  Diagnosis Date   Anxiety    Arthritis    Bipolar 1 disorder ([place])    Depression    Family history of adverse reaction to anesthesia    half  sister claims that she would stop breathing during surgery x 2   Hypertension    Osteoarthritis    PONV (postoperative nausea and vomiting)    Pre-diabetes     Past Surgical History:  Procedure Laterality Date   ANTERIOR CRUCIATE LIGAMENT (ACL) REVISION Right    CARPAL TUNNEL RELEASE     COLONOSCOPY     COLONOSCOPY WITH PROPOFOL N/A 11/17/2017   Procedure: COLONOSCOPY WITH PROPOFOL;  Surgeon: Virgel Manifold, MD;  Location: [facility] ENDOSCOPY;  Service: Endoscopy;  Laterality: N/A;   DILATION AND CURETTAGE OF UTERUS  2013   for heavy bleeding   ENDOMETRIAL ABLATION  2013   KNEE ARTHROPLASTY Right  10/02/2020   Procedure: COMPUTER ASSISTED TOTAL KNEE ARTHROPLASTY;  Surgeon: Dereck Leep, MD;  Location: [facility] ORS;  Service: Orthopedics;  Laterality: Right;   right wrist surgery     TUBAL LIGATION     WRIST ARTHROSCOPY Left 1990   for degenerative changes    Family Psychiatric History: Reviewed family psychiatric history from progress note on 07/27/2017  Family History:  Family History  Problem Relation Age of Onset   Diabetes Mother    Depression Mother    Hypertension Mother    Crohn's disease Mother    Lung cancer Mother 36       former smoker   Congestive Heart Failure Father    Breast cancer Sister 43   Alcohol abuse Sister    Drug abuse Sister    Anxiety disorder Sister    Depression Sister    Bipolar disorder Sister    Alcohol abuse Brother    Drug abuse Brother    Anxiety disorder Brother    Depression Brother    Colon polyps Brother    HIV Brother    Alcohol abuse Brother    Drug abuse Brother    Anxiety disorder Brother    Depression Brother    Colon polyps Brother    HIV Brother    Skin cancer Maternal Grandfather    Colon cancer Neg Hx    Ovarian cancer Neg Hx    Cervical cancer Neg Hx     Social History: Reviewed social history from progress note on 07/27/2017 [place] Social History   Socioeconomic History   Marital status: Divorced    Spouse name: Not on file   Number of children: 1   Years of education: Not on file   Highest education level: Some college, no degree  Occupational History   Occupation: disabled    Comment: on SSI awaiting decision about disability  Tobacco Use   Smoking status: Every Day    Packs/day: 0.50    Years: 45.00    Pack years: 22.50    Types: Cigarettes   Smokeless tobacco: Never   Tobacco comments:    started smoking at age 81  Vaping Use   Vaping Use: Former  Substance and Sexual Activity   Alcohol use: Yes    Comment: 1 times per month   Drug use: Not Currently    Comment: former crack cocaine user    Sexual activity: Yes    Partners: Male    Birth control/protection: Post-menopausal, Surgical  Other Topics Concern   Not on file  Social History Narrative   Live with brother and his husband   Social Determinants of Health   Financial Resource Strain: Not on file  Food Insecurity: Not on file  Transportation Needs: Not on file  Physical Activity: Not on file  Stress: Not  on file  Social Connections: Not on file    Allergies:  Allergies  Allergen Reactions   Pregabalin Other (See Comments)    Other reaction(s): Other (See Comments) Suicidal ideation  Suicidal ideation    Metabolic Disorder Labs: Lab Results  Component Value Date   HGBA1C 5.2 12/02/2020   No results found for: PROLACTIN Lab Results  Component Value Date   CHOL 154 12/02/2020   TRIG 161 (H) 12/02/2020   HDL 59 12/02/2020   CHOLHDL 2.6 12/02/2020   LDLCALC 68 12/02/2020   LDLCALC 77 05/27/2020   Lab Results  Component Value Date   TSH 0.801 12/02/2020   TSH 0.983 11/27/2019    Therapeutic Level Labs: No results found for: LITHIUM No results found for: VALPROATE No components found for:  CBMZ  Current Medications: Current Outpatient Medications  Medication Sig Dispense Refill   vitamin B-12 (CYANOCOBALAMIN) 1000 MCG tablet Take 1,000 mcg by mouth daily.     amLODipine (NORVASC) 10 MG tablet TAKE 1 TABLET BY MOUTH EVERY DAY (Patient taking differently: Take 10 mg by mouth daily.) 90 tablet 1   KLOR-CON M20 20 MEQ tablet TAKE 1 TABLET BY MOUTH EVERY DAY (Patient taking differently: Take 20 mEq by mouth daily.) 90 tablet 1   lamoTRIgine (LAMICTAL) 150 MG tablet Take 1 tablet (150 mg total) by mouth daily. 90 tablet 1   meloxicam (MOBIC) 15 MG tablet Take 15 mg by mouth daily.     methocarbamol (ROBAXIN) 500 MG tablet Take 500 mg by mouth 2 (two) times daily.     rosuvastatin (CRESTOR) 5 MG tablet Take 1 tablet (5 mg total) by mouth every other day. 45 tablet 3   triamterene-hydrochlorothiazide  (MAXZIDE-25) 37.5-25 MG tablet TAKE 1 TABLET BY MOUTH EVERY DAY 90 tablet 0   Ubiquinol 100 MG CAPS Take 100 mg by mouth daily.     No current facility-administered medications for this visit.     Musculoskeletal: Strength & Muscle Tone:  UTA Gait & Station: normal Patient leans: N/A  Psychiatric Specialty Exam: Review of Systems  Psychiatric/Behavioral: Negative.  Negative for behavioral problems, confusion, dysphoric mood, hallucinations, self-injury, sleep disturbance and suicidal ideas. The patient is not nervous/anxious and is not hyperactive.   All other systems reviewed and are negative.  Last menstrual period 12/07/2011.There is no height or weight on file to calculate BMI.  General Appearance: Casual  Eye Contact:  Fair  Speech:  Clear and Coherent  Volume:  Normal  Mood:  Reports she is ' happy "  Affect:  Congruent  Thought Process:  Goal Directed and Descriptions of Associations: Intact  Orientation:  Full (Time, Place, and Person)  Thought Content: Logical reports she does have episodes of seeing something out of the corner of her eyes on and off when she is at her daughter's business.  However it does not overwhelm her, she is able to cope with it.  Suicidal Thoughts:  No  Homicidal Thoughts:  No  Memory:  Immediate;   Fair Recent;   Fair Remote;   Fair  Judgement:  Fair  Insight:  Fair  Psychomotor Activity:  Normal  Concentration:  Concentration: Fair and Attention Span: Fair  Recall:  [employer] of Knowledge: Good  Language: Fair  Akathisia:  No  Handed:  Right  AIMS (if indicated): done  Assets:  Communication Skills Desire for [place] Talents/Skills Transportation  ADL's:  Intact  Cognition: WNL  Sleep:  Fair   Screenings:  GAD-7    Flowsheet Row Video Visit from 01/22/2020 in [place] Office Visit from 07/13/2017 in [facility]  Total GAD-7 Score 15 12       PHQ2-9    [place] Office Visit from 12/02/2020 in [facility] Video Visit from 09/16/2020 in [place] Video Visit from 06/25/2020 in [place] Office Visit from 02/23/2020 in [place] Visit from 11/27/2019 in [place]  PHQ-2 Total Score 0 0 1 2 1   PHQ-9 Total Score 2 1 -- 8 3      Flowsheet Row Admission (Discharged) from 10/02/2020 in [place] (1A) Pre-Admission Testing 60 from 09/24/2020 in [place] Video Visit from 09/16/2020 in [place]  C-SSRS RISK CATEGORY No Risk No Risk No Risk        Assessment and Plan: Alexa Newman is a 60 year old Caucasian female, divorced, on SSI, lives in [place], has a history of bipolar disorder, borderline personality disorder, insomnia was evaluated by telemedicine today.  Patient is biologically predisposed, given family history of mental health problems.  Patient reports possible manic symptoms however reports she is able to manage it and is not interestedin medication changes yet.  However if it last more than 24 hours she will be in touch.  Plan as noted below.  Plan Bipolar disorder , mild manic-unstable Patient is not interested in medication changes at this time. Lamotrigine 150 mg p.o. daily Patient agrees to call the clinic back if her symptoms lasts more than 24 hours.  Insomnia-stable We will monitor closely  GAD-stable Continue CBT as needed  Tobacco use disorder-improving We will monitor closely.  Provided counseling for 2 minutes.  Follow-up in clinic in 1 month or sooner if needed in person.  This note was generated in part or whole with voice recognition software. Voice recognition is usually quite accurate but there are transcription errors that can and very often do occur. I apologize for  any typographical errors that were not detected and corrected.     Ursula Alert, MD 12/16/2020, 12:58 PM

## 2020-12-21 ENCOUNTER — Other Ambulatory Visit: Payer: Self-pay | Admitting: Family Medicine

## 2020-12-21 NOTE — Telephone Encounter (Signed)
Requested medication (s) are due for refill today: -  Requested medication (s) are on the active medication list: historical provider  Last refill:  11/25/20   Future visit scheduled: yes  Notes to clinic:  dc'd 12/02/20- historical med   Requested Prescriptions  Pending Prescriptions Disp Refills   meloxicam (MOBIC) 15 MG tablet [Pharmacy Med Name: MELOXICAM 15 MG TABLET] 30 tablet 2    Sig: TAKE 1 TABLET BY MOUTH EVERY DAY     Analgesics:  COX2 Inhibitors Failed - 12/21/2020  9:07 AM      Failed - Cr in normal range and within 360 days    Creatinine, Ser  Date Value Ref Range Status  12/02/2020 1.01 (H) 0.57 - 1.00 mg/dL Final          Passed - HGB in normal range and within 360 days    Hemoglobin  Date Value Ref Range Status  12/02/2020 13.3 11.1 - 15.9 g/dL Final          Passed - Patient is not pregnant      Passed - Valid encounter within last 12 months    Recent Outpatient Visits           2 weeks ago Encounter for annual physical exam   [facility], Dionne Bucy, MD   5 months ago Left hip pain   [place], Vickki Muff, PA-C   6 months ago Essential hypertension   [facility] [place], Dionne Bucy, MD   10 months ago Myalgia due to statin   [facility], Dionne Bucy, MD   10 months ago Acute pain of right shoulder   [facility] Birdie Sons, MD       Future Appointments             In 5 months Bacigalupo, Dionne Bucy, MD [facility], [place]

## 2020-12-21 NOTE — Telephone Encounter (Signed)
Requested Prescriptions  Pending Prescriptions Disp Refills  . KLOR-CON M20 20 MEQ tablet [Pharmacy Med Name: KLOR-CON M20 TABLET] 90 tablet 1    Sig: TAKE 1 TABLET BY MOUTH EVERY DAY     Endocrinology:  Minerals - Potassium Supplementation Failed - 12/21/2020  9:00 AM      Failed - Cr in normal range and within 360 days    Creatinine, Ser  Date Value Ref Range Status  12/02/2020 1.01 (H) 0.57 - 1.00 mg/dL Final         Passed - K in normal range and within 360 days    Potassium  Date Value Ref Range Status  12/02/2020 4.0 3.5 - 5.2 mmol/L Final         Passed - Valid encounter within last 12 months    Recent Outpatient Visits          2 weeks ago Encounter for annual physical exam   [facility], Dionne Bucy, MD   5 months ago Left hip pain   [place], Vickki Muff, PA-C   6 months ago Essential hypertension   [facility], Dionne Bucy, MD   10 months ago Myalgia due to statin   [facility], Dionne Bucy, MD   10 months ago Acute pain of right shoulder   [facility] Birdie Sons, MD      Future Appointments            In 5 months Bacigalupo, Dionne Bucy, MD [facility]           Signed Prescriptions Disp Refills   amLODipine (NORVASC) 10 MG tablet 90 tablet 1    Sig: TAKE 1 TABLET BY MOUTH EVERY DAY     Cardiovascular:  Calcium Channel Blockers Passed - 12/21/2020  9:00 AM      Passed - Last BP in normal range    BP Readings from Last 1 Encounters:  12/02/20 136/83         Passed - Valid encounter within last 6 months    Recent Outpatient Visits          2 weeks ago Encounter for annual physical exam   [facility] [place], Dionne Bucy, MD   5 months ago Left hip pain   [place], Vickki Muff, PA-C   6 months ago Essential hypertension   [facility] [place], Dionne Bucy, MD   10  months ago Myalgia due to statin   [facility], Dionne Bucy, MD   10 months ago Acute pain of right shoulder   [facility] Birdie Sons, MD      Future Appointments            In 5 months Bacigalupo, Dionne Bucy, MD [facility], [place]

## 2020-12-21 NOTE — Telephone Encounter (Signed)
Requested Prescriptions  Pending Prescriptions Disp Refills  . amLODipine (NORVASC) 10 MG tablet [Pharmacy Med Name: AMLODIPINE BESYLATE 10 MG TAB] 90 tablet 1    Sig: TAKE 1 TABLET BY MOUTH EVERY DAY     Cardiovascular:  Calcium Channel Blockers Passed - 12/21/2020  9:00 AM      Passed - Last BP in normal range    BP Readings from Last 1 Encounters:  12/02/20 136/83         Passed - Valid encounter within last 6 months    Recent Outpatient Visits          2 weeks ago Encounter for annual physical exam   [facility], Dionne Bucy, MD   5 months ago Left hip pain   [place], Vickki Muff, PA-C   6 months ago Essential hypertension   [facility] [place], Dionne Bucy, MD   10 months ago Myalgia due to statin   [facility], Dionne Bucy, MD   10 months ago Acute pain of right shoulder   [facility] Birdie Sons, MD      Future Appointments            In 5 months Bacigalupo, Dionne Bucy, MD [facility], [place]           . KLOR-CON M20 20 MEQ tablet [Pharmacy Med Name: KLOR-CON M20 TABLET] 90 tablet 1    Sig: TAKE 1 TABLET BY MOUTH EVERY DAY     Endocrinology:  Minerals - Potassium Supplementation Failed - 12/21/2020  9:00 AM      Failed - Cr in normal range and within 360 days    Creatinine, Ser  Date Value Ref Range Status  12/02/2020 1.01 (H) 0.57 - 1.00 mg/dL Final         Passed - K in normal range and within 360 days    Potassium  Date Value Ref Range Status  12/02/2020 4.0 3.5 - 5.2 mmol/L Final         Passed - Valid encounter within last 12 months    Recent Outpatient Visits          2 weeks ago Encounter for annual physical exam   [facility] [place], Dionne Bucy, MD   5 months ago Left hip pain   [place], Vickki Muff, PA-C   6 months ago Essential hypertension   [facility] [place],  Dionne Bucy, MD   10 months ago Myalgia due to statin   [facility], Dionne Bucy, MD   10 months ago Acute pain of right shoulder   [facility] Birdie Sons, MD      Future Appointments            In 5 months Bacigalupo, Dionne Bucy, MD [facility], [place]

## 2021-01-03 DIAGNOSIS — E860 Dehydration: Secondary | ICD-10-CM | POA: Diagnosis not present

## 2021-01-03 DIAGNOSIS — E538 Deficiency of other specified B group vitamins: Secondary | ICD-10-CM | POA: Diagnosis not present

## 2021-01-04 LAB — COMPREHENSIVE METABOLIC PANEL
ALT: 13 IU/L (ref 0–32)
AST: 14 IU/L (ref 0–40)
Albumin/Globulin Ratio: 2 (ref 1.2–2.2)
Albumin: 4.5 g/dL (ref 3.8–4.9)
Alkaline Phosphatase: 130 IU/L — ABNORMAL HIGH (ref 44–121)
BUN/Creatinine Ratio: 19 (ref 9–23)
BUN: 14 mg/dL (ref 6–24)
Bilirubin Total: 0.3 mg/dL (ref 0.0–1.2)
CO2: 26 mmol/L (ref 20–29)
Calcium: 10.1 mg/dL (ref 8.7–10.2)
Chloride: 101 mmol/L (ref 96–106)
Creatinine, Ser: 0.75 mg/dL (ref 0.57–1.00)
Globulin, Total: 2.2 g/dL (ref 1.5–4.5)
Glucose: 100 mg/dL — ABNORMAL HIGH (ref 70–99)
Potassium: 3.7 mmol/L (ref 3.5–5.2)
Sodium: 140 mmol/L (ref 134–144)
Total Protein: 6.7 g/dL (ref 6.0–8.5)
eGFR: 92 mL/min/{1.73_m2} (ref >59)

## 2021-01-04 LAB — VITAMIN B12: Vitamin B-12: 534 pg/mL (ref 232–1245)

## 2021-01-14 ENCOUNTER — Other Ambulatory Visit: Payer: Self-pay

## 2021-01-14 ENCOUNTER — Ambulatory Visit (INDEPENDENT_AMBULATORY_CARE_PROVIDER_SITE_OTHER): Payer: Medicaid Other | Admitting: Psychiatry

## 2021-01-14 ENCOUNTER — Encounter: Payer: Self-pay | Admitting: Psychiatry

## 2021-01-14 VITALS — BP 134/84 | HR 96 | Temp 98.7°F | Wt 178.8 lb

## 2021-01-14 DIAGNOSIS — F3178 Bipolar disorder, in full remission, most recent episode mixed: Secondary | ICD-10-CM

## 2021-01-14 DIAGNOSIS — F5105 Insomnia due to other mental disorder: Secondary | ICD-10-CM | POA: Diagnosis not present

## 2021-01-14 DIAGNOSIS — F17201 Nicotine dependence, unspecified, in remission: Secondary | ICD-10-CM

## 2021-01-14 DIAGNOSIS — F1421 Cocaine dependence, in remission: Secondary | ICD-10-CM

## 2021-01-14 DIAGNOSIS — Z8659 Personal history of other mental and behavioral disorders: Secondary | ICD-10-CM

## 2021-01-14 DIAGNOSIS — F411 Generalized anxiety disorder: Secondary | ICD-10-CM | POA: Diagnosis not present

## 2021-01-14 NOTE — Progress Notes (Signed)
[facility] MD OP Progress Note  01/14/2021 4:26 PM Alexa Newman  MRN:  [id number]  Chief Complaint:  Chief Complaint   Follow-up; Anxiety    HPI: Alexa Newman is a 60 year old Caucasian female, divorced, on SSI, lives in [place], has a history of bipolar disorder, borderline personality disorder, cocaine use disorder, GAD, insomnia, tobacco use disorder was evaluated in office today.  Patient today reports she is overall doing well.  She reports the last visit she had manic symptoms however that did not last too long. She hence did not feel the need to contact writer back.  She stayed on the Lamictal 150 mg which has been beneficial.  She reports her mood symptoms are currently stable.  She does feel more active and energetic only because she is currently better with her pain.  She had knee replacement surgery and is able to ambulate better.  She also reports she had vitamin B12 injection which also helped with her fatigue.  Patient denies any suicidality, homicidality or perceptual disturbances.  She quit smoking a week ago and is excited about that.  Patient denies any other concerns today.  Visit Diagnosis:    ICD-10-CM   1. Bipolar disorder, in full remission, most recent episode mixed ([place])  F31.78     2. GAD (generalized anxiety disorder)  F41.1     3. Insomnia due to mental condition  F51.05     4. Tobacco use disorder, mild, in early remission  F17.201     5. Hx of borderline personality disorder  Z86.59     6. Cocaine use disorder, moderate, in sustained remission ([place])  F14.21       Past Psychiatric History: Reviewed past psychiatric history from progress note on 07/27/2017.    Past Medical History:  Past Medical History:  Diagnosis Date   Anxiety    Arthritis    Bipolar 1 disorder ([place])    Depression    Family history of adverse reaction to anesthesia    half sister claims that she would stop breathing during surgery x 2   Hypertension    Osteoarthritis     PONV (postoperative nausea and vomiting)    Pre-diabetes     Past Surgical History:  Procedure Laterality Date   ANTERIOR CRUCIATE LIGAMENT (ACL) REVISION Right    CARPAL TUNNEL RELEASE     COLONOSCOPY     COLONOSCOPY WITH PROPOFOL N/A 11/17/2017   Procedure: COLONOSCOPY WITH PROPOFOL;  Surgeon: Virgel Manifold, MD;  Location: [facility] END[facility]COPY;  Service: Endoscopy;  Laterality: N/A;   DILATION AND CURETTAGE OF UTERUS  2013   for heavy bleeding   ENDOMETRIAL ABLATION  2013   KNEE ARTHROPLASTY Right 10/02/2020   Procedure: COMPUTER ASSISTED TOTAL KNEE ARTHROPLASTY;  Surgeon: Dereck Leep, MD;  Location: [facility] ORS;  Service: Orthopedics;  Laterality: Right;   right wrist surgery     TUBAL LIGATION     WRIST ARTHROSCOPY Left 1990   for degenerative changes    Family Psychiatric History: Reviewed family psychiatric history from progress note on 07/27/2017.  Family History:  Family History  Problem Relation Age of Onset   Diabetes Mother    Depression Mother    Hypertension Mother    Crohn's disease Mother    Lung cancer Mother 57       former smoker   Congestive Heart Failure Father    Breast cancer Sister 70   Alcohol abuse Sister    Drug abuse Sister  Anxiety disorder Sister    Depression Sister    Bipolar disorder Sister    Alcohol abuse Brother    Drug abuse Brother    Anxiety disorder Brother    Depression Brother    Colon polyps Brother    HIV Brother    Alcohol abuse Brother    Drug abuse Brother    Anxiety disorder Brother    Depression Brother    Colon polyps Brother    HIV Brother    Skin cancer Maternal Grandfather    Colon cancer Neg Hx    Ovarian cancer Neg Hx    Cervical cancer Neg Hx     Social History: Reviewed social history from progress note on 07/27/2017. Social History   Socioeconomic History   Marital status: Divorced    Spouse name: Not on file   Number of children: 1   Years of education: Not on file   Highest education  level: Some college, no degree  Occupational History   Occupation: disabled    Comment: on SSI awaiting decision about disability  Tobacco Use   Smoking status: Former    Packs/day: 0.50    Years: 45.00    Pack years: 22.50    Types: Cigarettes    Quit date: 01/06/2021    Years since quitting: 0.0   Smokeless tobacco: Never   Tobacco comments:    started smoking at age 63  Vaping Use   Vaping Use: Former  Substance and Sexual Activity   Alcohol use: Yes    Comment: 1 times per month   Drug use: Not Currently    Comment: former crack cocaine user   Sexual activity: Yes    Partners: Male    Birth control/protection: Post-menopausal, Surgical  Other Topics Concern   Not on file  Social History Narrative   Live with brother and his husband   Social Determinants of Health   Financial Resource Strain: Not on file  Food Insecurity: Not on file  Transportation Needs: Not on file  Physical Activity: Not on file  Stress: Not on file  Social Connections: Not on file    Allergies:  Allergies  Allergen Reactions   Pregabalin Other (See Comments)    Other reaction(s): Other (See Comments) Suicidal ideation  Suicidal ideation    Metabolic Disorder Labs: Lab Results  Component Value Date   HGBA1C 5.2 12/02/2020   No results found for: PROLACTIN Lab Results  Component Value Date   CHOL 154 12/02/2020   TRIG 161 (H) 12/02/2020   HDL 59 12/02/2020   CHOLHDL 2.6 12/02/2020   LDLCALC 68 12/02/2020   LDLCALC 77 05/27/2020   Lab Results  Component Value Date   TSH 0.801 12/02/2020   TSH 0.983 11/27/2019    Therapeutic Level Labs: No results found for: LITHIUM No results found for: VALPROATE No components found for:  CBMZ  Current Medications: Current Outpatient Medications  Medication Sig Dispense Refill   amLODipine (NORVASC) 10 MG tablet TAKE 1 TABLET BY MOUTH EVERY DAY 90 tablet 1   KLOR-CON M20 20 MEQ tablet TAKE 1 TABLET BY MOUTH EVERY DAY 90 tablet 1    lamoTRIgine (LAMICTAL) 150 MG tablet Take 1 tablet (150 mg total) by mouth daily. 90 tablet 1   meloxicam (MOBIC) 15 MG tablet TAKE 1 TABLET BY MOUTH EVERY DAY 30 tablet 2   methocarbamol (ROBAXIN) 500 MG tablet Take 500 mg by mouth 2 (two) times daily.     rosuvastatin (CRESTOR) 5 MG tablet  Take 1 tablet (5 mg total) by mouth every other day. 45 tablet 3   triamterene-hydrochlorothiazide (MAXZIDE-25) 37.5-25 MG tablet TAKE 1 TABLET BY MOUTH EVERY DAY 90 tablet 0   Ubiquinol 100 MG CAPS Take 100 mg by mouth daily.     vitamin B-12 (CYANOCOBALAMIN) 1000 MCG tablet Take 1,000 mcg by mouth daily.     No current facility-administered medications for this visit.     Musculoskeletal: Strength & Muscle Tone: within normal limits Gait & Station:  walks with cane for support Patient leans: N/A  Psychiatric Specialty Exam: Review of Systems  Psychiatric/Behavioral:  Negative for decreased concentration, dysphoric mood, hallucinations, self-injury, sleep disturbance and suicidal ideas. The patient is not nervous/anxious and is not hyperactive.   All other systems reviewed and are negative.  Blood pressure 134/84, pulse 96, temperature 98.7 F (37.1 C), weight 178 lb 12.8 oz (81.1 kg), last menstrual period 12/07/2011.Body mass index is 30.69 kg/m.  General Appearance: Casual  Eye Contact:  Fair  Speech:  Clear and Coherent  Volume:  Normal  Mood:  Euthymic  Affect:  Congruent  Thought Process:  Goal Directed and Descriptions of Associations: Intact  Orientation:  Full (Time, Place, and Person)  Thought Content: Logical   Suicidal Thoughts:  No  Homicidal Thoughts:  No  Memory:  Immediate;   Fair Recent;   Fair Remote;   Fair  Judgement:  Fair  Insight:  Fair  Psychomotor Activity:  Normal  Concentration:  Concentration: Fair and Attention Span: Fair  Recall:  [employer] of Knowledge: Fair  Language: Fair  Akathisia:  No  Handed:  Right  AIMS (if indicated): done, 0  Assets:   Communication Skills Desire for Improvement Housing Social Support  ADL's:  Intact  Cognition: WNL  Sleep:  Fair   Screenings: GAD-7    [place] Office Visit from 01/14/2021 in [facility] Video Visit from 01/22/2020 in [place] Office Visit from 07/13/2017 in [place]  Total GAD-7 Score 1 15 12       PHQ2-9    [place] Visit from 01/14/2021 in [facility] Visit from 12/02/2020 in [facility] Video Visit from 09/16/2020 in [place] Video Visit from 06/25/2020 in [facility] Office Visit from 02/23/2020 in [place]  PHQ-2 Total Score 0 0 0 1 2  PHQ-9 Total Score -- 2 1 -- 8      [place] Admission (Discharged) from 10/02/2020 in [facility] (1A) Pre-Admission Testing 60 from 09/24/2020 in [place] Video Visit from 09/16/2020 in [facility] [place]  C-SSRS RISK CATEGORY No Risk No Risk No Risk        Assessment and Plan: KOURTNEE LAHEY is a 60 year old Caucasian female, divorced, on SSI, lives in [place], has a history of bipolar disorder, borderline personality disorder, insomnia was evaluated in office today.  Patient is currently stable on current medication regimen.  Plan as noted below.  Plan Bipolar disorder in remission Lamotrigine 150 mg p.o. daily  Insomnia-stable We will monitor closely  GAD-stable Continue CBT as needed  Tobacco use disorder in remission Patient quit smoking a week ago.  Follow-up in clinic in 3 months or sooner if needed.  This note was generated in part or whole with voice recognition software. Voice recognition is usually quite accurate but there are transcription errors that can and very  often do occur. I apologize for  any typographical errors that were not detected and corrected.       Ursula Alert, MD 01/14/2021, 4:26 PM

## 2021-02-13 ENCOUNTER — Other Ambulatory Visit: Payer: Self-pay | Admitting: Family Medicine

## 2021-02-14 NOTE — Telephone Encounter (Signed)
Requested Prescriptions  Pending Prescriptions Disp Refills  . triamterene-hydrochlorothiazide (MAXZIDE-25) 37.5-25 MG tablet [Pharmacy Med Name: TRIAMTERENE-HCTZ 37.5-25 MG TB] 90 tablet 0    Sig: TAKE 1 TABLET BY MOUTH EVERY DAY     Cardiovascular: Diuretic Combos Passed - 02/13/2021  7:12 PM      Passed - K in normal range and within 360 days    Potassium  Date Value Ref Range Status  01/03/2021 3.7 3.5 - 5.2 mmol/L Final         Passed - Na in normal range and within 360 days    Sodium  Date Value Ref Range Status  01/03/2021 140 134 - 144 mmol/L Final         Passed - Cr in normal range and within 360 days    Creatinine, Ser  Date Value Ref Range Status  01/03/2021 0.75 0.57 - 1.00 mg/dL Final         Passed - Ca in normal range and within 360 days    Calcium  Date Value Ref Range Status  01/03/2021 10.1 8.7 - 10.2 mg/dL Final         Passed - Last BP in normal range    BP Readings from Last 1 Encounters:  12/02/20 136/83         Passed - Valid encounter within last 6 months    Recent Outpatient Visits          2 months ago Encounter for annual physical exam   [facility], Dionne Bucy, MD   7 months ago Left hip pain   [place], Vickki Muff, PA-C   8 months ago Essential hypertension   [facility], Dionne Bucy, MD   11 months ago Myalgia due to statin   [facility], Dionne Bucy, MD   1 year ago Acute pain of right shoulder   [facility] Birdie Sons, MD      Future Appointments            In 3 months Bacigalupo, Dionne Bucy, MD [facility], [place]

## 2021-03-09 ENCOUNTER — Other Ambulatory Visit: Payer: Self-pay | Admitting: Family Medicine

## 2021-03-27 ENCOUNTER — Other Ambulatory Visit: Payer: Self-pay | Admitting: Family Medicine

## 2021-03-27 NOTE — Telephone Encounter (Signed)
Requested Prescriptions  Pending Prescriptions Disp Refills   meloxicam (MOBIC) 15 MG tablet [Pharmacy Med Name: MELOXICAM 15 MG TABLET] 30 tablet 2    Sig: TAKE 1 TABLET BY MOUTH EVERY DAY     Analgesics:  COX2 Inhibitors Passed - 03/27/2021  8:59 PM      Passed - HGB in normal range and within 360 days    Hemoglobin  Date Value Ref Range Status  12/02/2020 13.3 11.1 - 15.9 g/dL Final         Passed - Cr in normal range and within 360 days    Creatinine, Ser  Date Value Ref Range Status  01/03/2021 0.75 0.57 - 1.00 mg/dL Final         Passed - Patient is not pregnant      Passed - Valid encounter within last 12 months    Recent Outpatient Visits          3 months ago Encounter for annual physical exam   [facility], Dionne Bucy, MD   8 months ago Left hip pain   [facility], Vickki Muff, PA-C   10 months ago Essential hypertension   [facility] [place], Dionne Bucy, MD   1 year ago Myalgia due to statin   [facility], Dionne Bucy, MD   1 year ago Acute pain of right shoulder   [facility] Birdie Sons, MD      Future Appointments            In 2 months Bacigalupo, Dionne Bucy, MD [facility] [place]

## 2021-04-03 IMAGING — MG MM DIGITAL SCREENING BILAT W/ TOMO AND CAD
8 series · 8 of 24 positions shown · non-contrast
Comparison: Previous exam(s).

CLINICAL DATA: Screening.

EXAM:
DIGITAL SCREENING BILATERAL MAMMOGRAM WITH TOMOSYNTHESIS AND CAD
TECHNIQUE: Bilateral screening digital craniocaudal and mediolateral oblique
mammograms were obtained. Bilateral screening digital breast
tomosynthesis was performed. The images were evaluated with
computer-aided detection.

[R CC synth-2D]
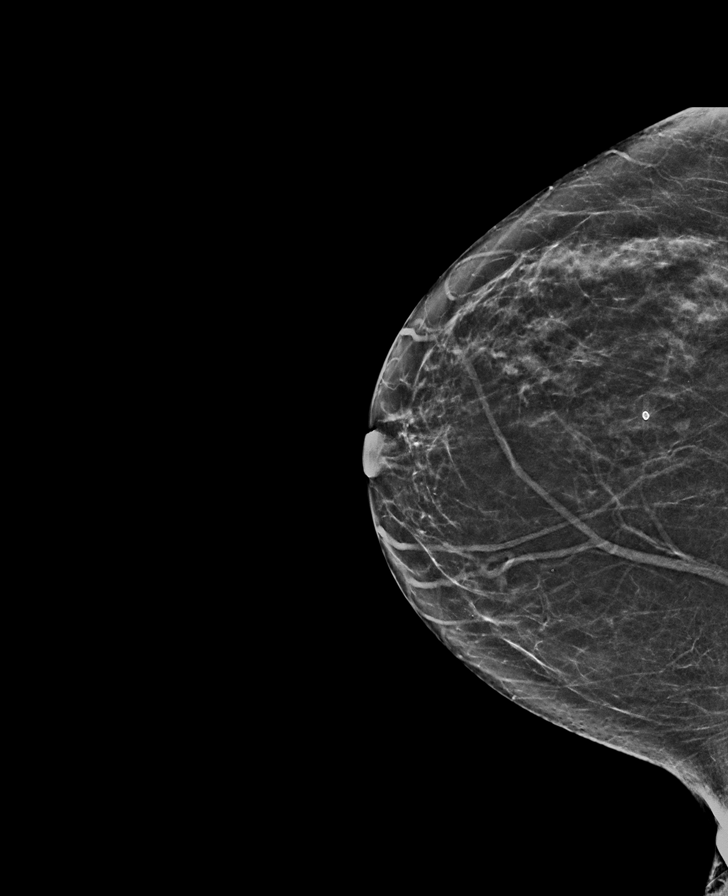

[L CC synth-2D]
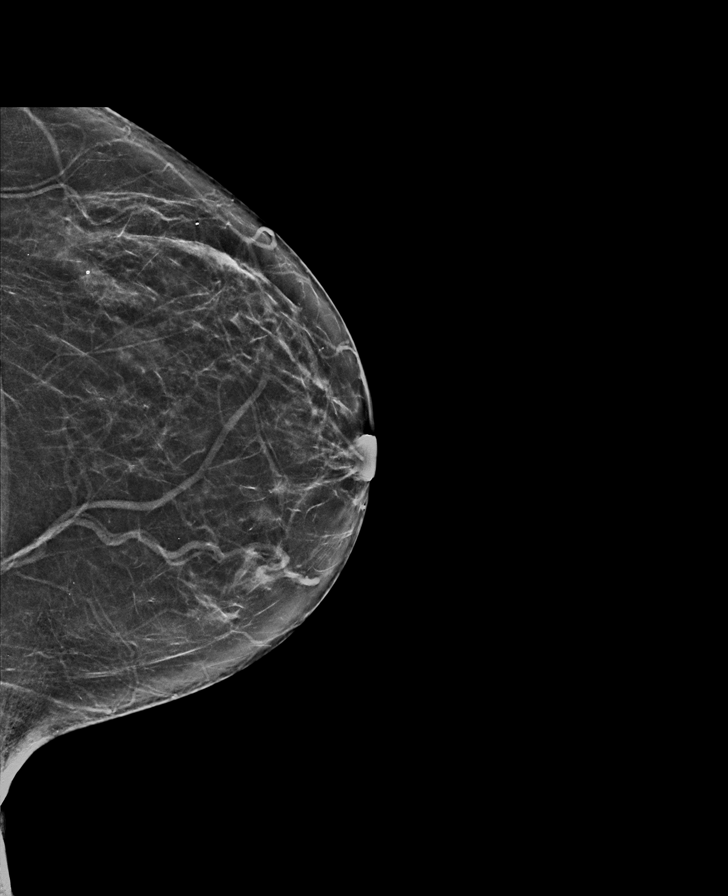

[R MLO synth-2D]
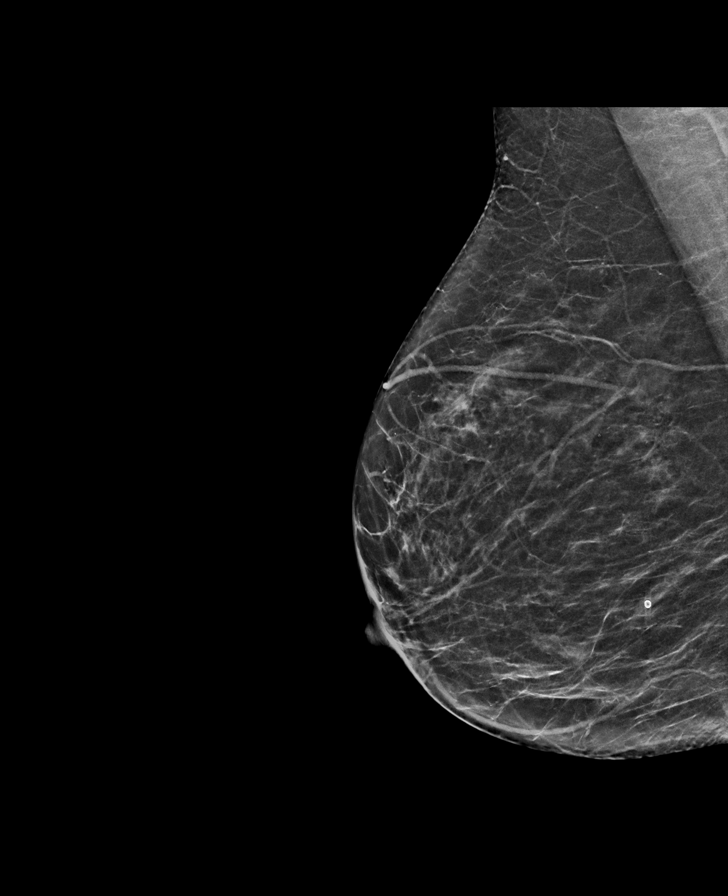

[L MLO synth-2D]
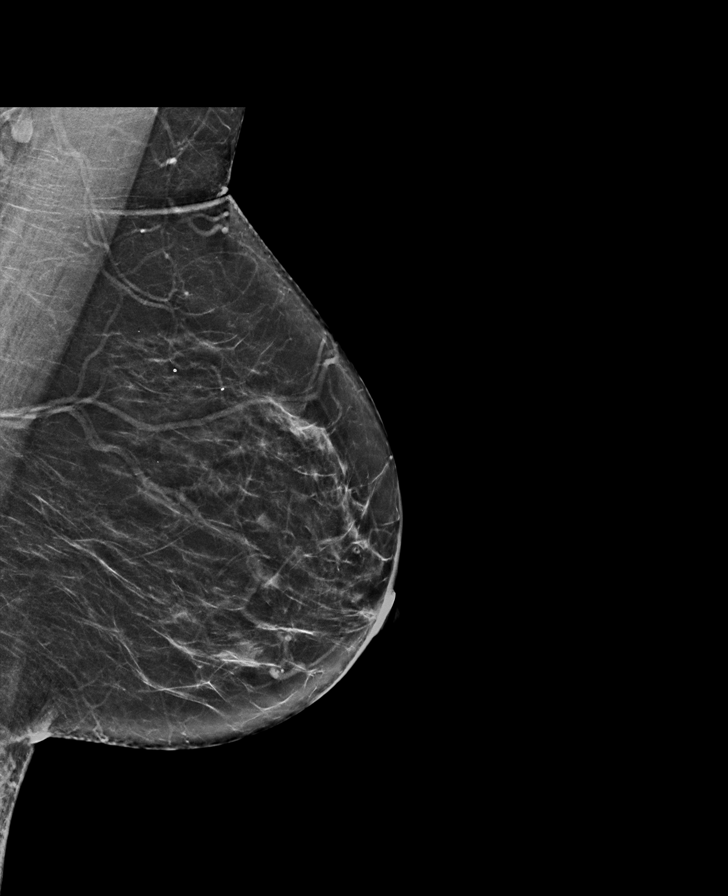

[R MLO tomo · tomo slice 31/62.0]
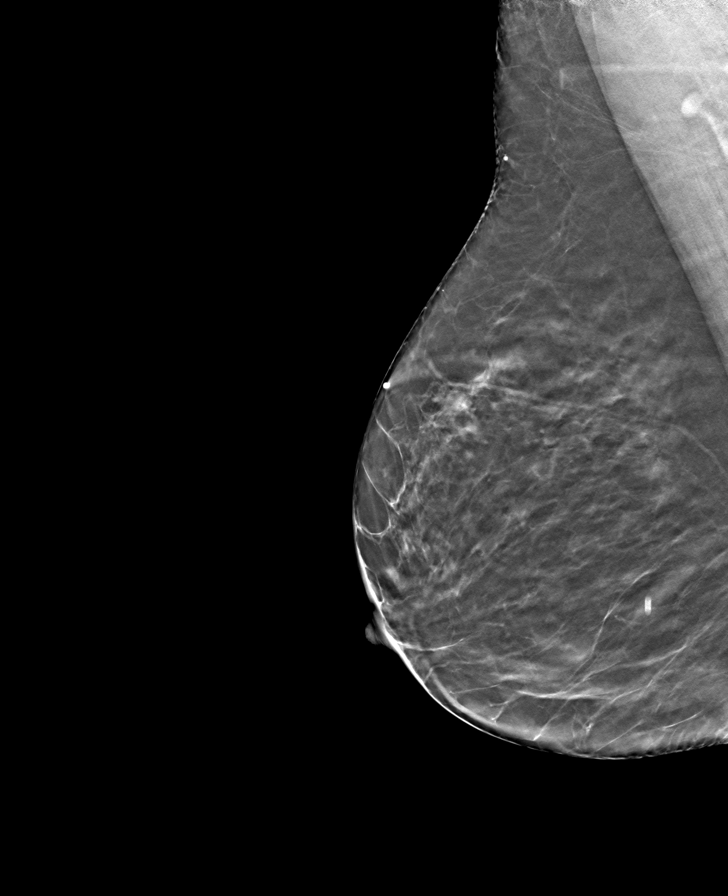

[L MLO tomo · tomo slice 33/66.0]
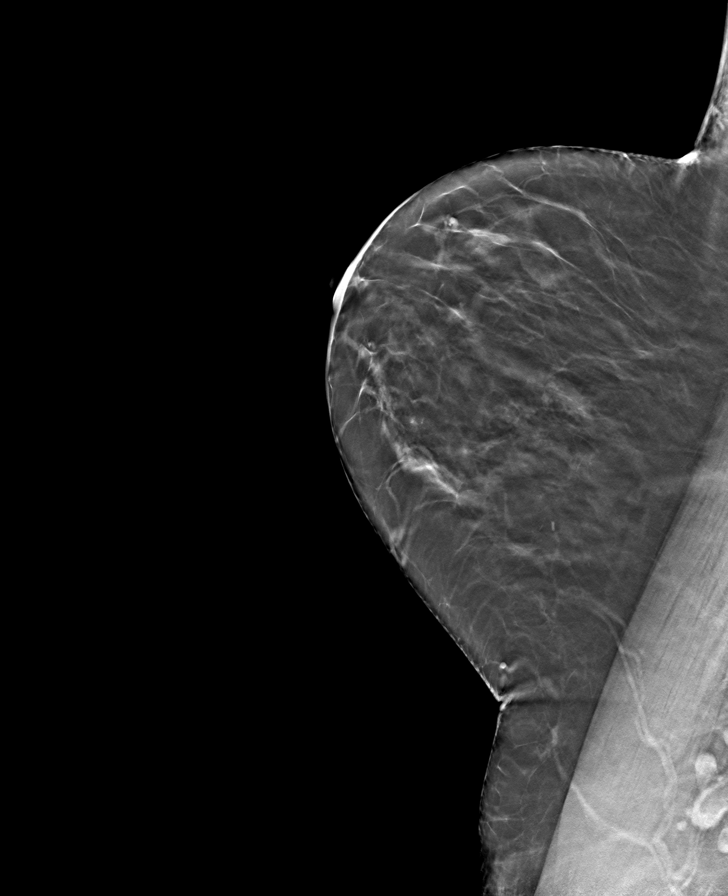

[L CC tomo · tomo slice 31/62.0]
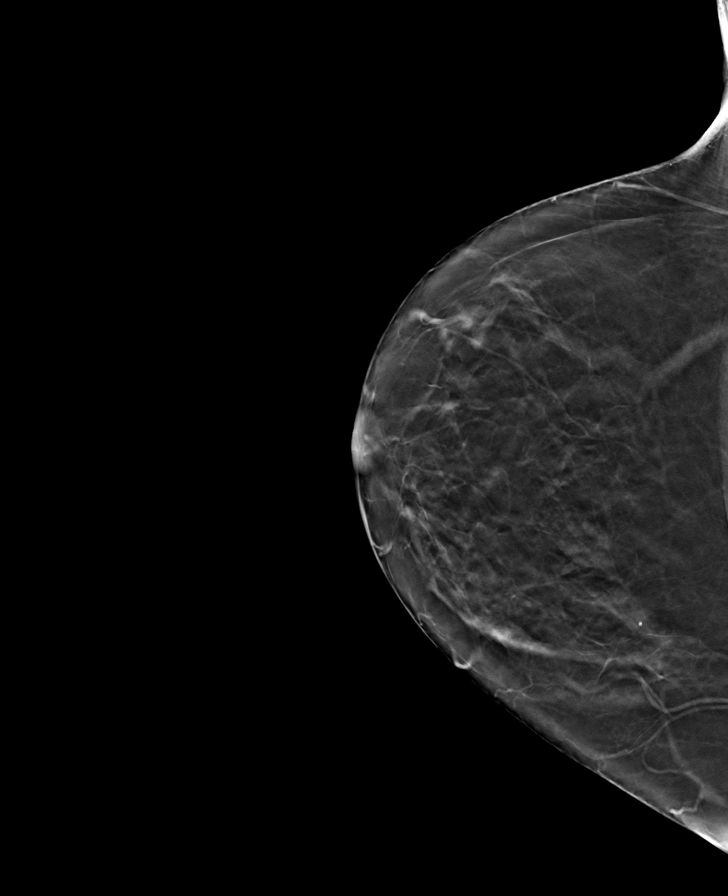

[R CC tomo · tomo slice 31/61.0]
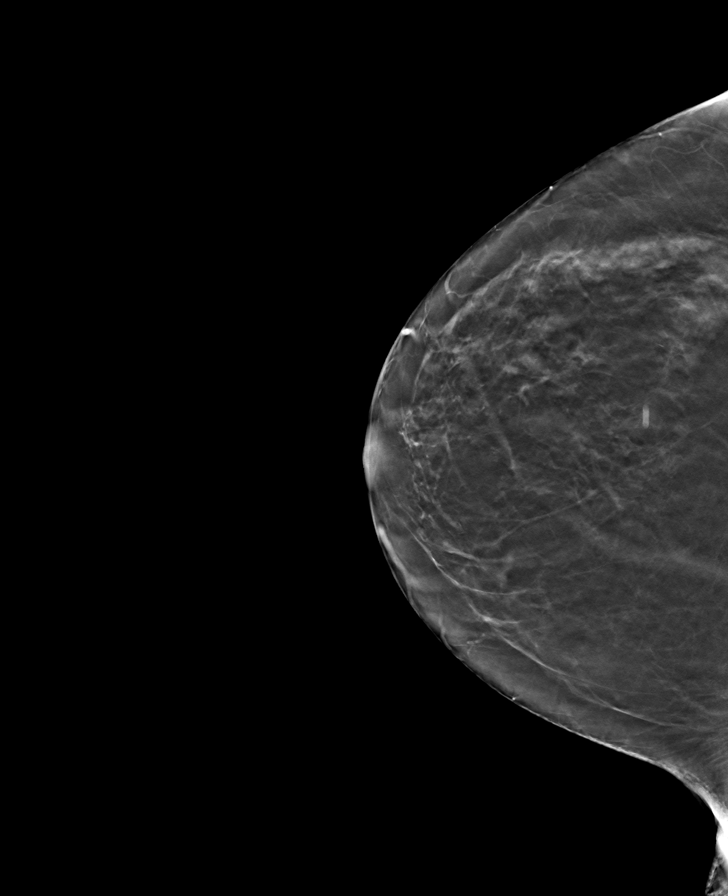

[8 of 24 positions shown; findings below may reference images not displayed]

ACR Breast Density Category b: There are scattered areas of
fibroglandular density.
FINDINGS: There are no findings suspicious for malignancy. The images were
evaluated with computer-aided detection.
IMPRESSION: No mammographic evidence of malignancy. A result letter of this
screening mammogram will be mailed directly to the patient.

RECOMMENDATION:
Screening mammogram in one year. (Code:[id number])

BI-RADS CATEGORY  1: Negative.

## 2021-04-16 ENCOUNTER — Other Ambulatory Visit: Payer: Self-pay

## 2021-04-16 ENCOUNTER — Telehealth (INDEPENDENT_AMBULATORY_CARE_PROVIDER_SITE_OTHER): Payer: Medicaid Other | Admitting: Psychiatry

## 2021-04-16 ENCOUNTER — Encounter: Payer: Self-pay | Admitting: Psychiatry

## 2021-04-16 DIAGNOSIS — F1421 Cocaine dependence, in remission: Secondary | ICD-10-CM

## 2021-04-16 DIAGNOSIS — F17201 Nicotine dependence, unspecified, in remission: Secondary | ICD-10-CM | POA: Insufficient documentation

## 2021-04-16 DIAGNOSIS — F5105 Insomnia due to other mental disorder: Secondary | ICD-10-CM

## 2021-04-16 DIAGNOSIS — Z8659 Personal history of other mental and behavioral disorders: Secondary | ICD-10-CM

## 2021-04-16 DIAGNOSIS — F3178 Bipolar disorder, in full remission, most recent episode mixed: Secondary | ICD-10-CM

## 2021-04-16 DIAGNOSIS — F411 Generalized anxiety disorder: Secondary | ICD-10-CM | POA: Diagnosis not present

## 2021-04-16 MED ORDER — LAMOTRIGINE 150 MG PO TABS: 150.0000 mg | ORAL_TABLET | Freq: Every day | ORAL | 1 refills | Status: DC

## 2021-04-16 NOTE — Progress Notes (Signed)
Virtual Visit via Video Note  I connected with Alexa Newman on 04/16/21 at 10:30 AM EST by a video enabled telemedicine application and verified that I am speaking with the correct person using two identifiers.  Location Provider Location : [facility] Patient Location : Home  Participants: Patient , Provider    I discussed the limitations of evaluation and management by telemedicine and the availability of in person appointments. The patient expressed understanding and agreed to proceed.    I discussed the assessment and treatment plan with the patient. The patient was provided an opportunity to ask questions and all were answered. The patient agreed with the plan and demonstrated an understanding of the instructions.   The patient was advised to call back or seek an in-person evaluation if the symptoms worsen or if the condition fails to improve as anticipated.    [place] MD OP Progress Note  04/16/2021 11:20 AM TEANA LINDAHL  MRN:  [id number]  Chief Complaint:  Chief Complaint   Follow-up , 61 year old Caucasian female with history of bipolar disorder, GAD, insomnia, borderline personality disorder, tobacco use disorder was evaluated for medication management.    HPI: Alexa Newman is a 61 year old Caucasian female, divorced, on SSI, lives in [place], has a history of bipolar disorder, borderline personality disorder, cocaine use disorder, GAD, insomnia, tobacco use disorder was evaluated by telemedicine today.  Patient status post right knee surgery 01/2021.  Patient reports she is currently recovering.  She is trying to exercise and has been doing a lot of activities.  She however reports she does feel like she gets tired too easily, unknown if this is due to her recent surgery or not.  Patient however believes she is not depressed.  She reports she has not had any crying spells, low mood or sadness.  She reports appetite is fair.  She reports she continues to enjoy doing work as  well as her hobbies.  She continues to have good support system from her daughter.  Reports sleep is good.  Denies suicidality, homicidality or perceptual disturbances.  Patient is compliant on the Lamictal, denies side effects.  Continues to smoke cigarettes although she has cut back.  Is interested in quitting.  She does have nicotine patches however does not feel that is helpful.  Patient reports she does have to find a way to keep herself distracted especially have something in her hand and she will be able to quit.  She is not interested in any other medications for smoking cessation at this time.  She denies any other concerns today.  Visit Diagnosis:    ICD-10-CM   1. Bipolar disorder, in full remission, most recent episode mixed (HCC)  F31.78 lamoTRIgine (LAMICTAL) 150 MG tablet    2. GAD (generalized anxiety disorder)  F41.1 lamoTRIgine (LAMICTAL) 150 MG tablet    3. Insomnia due to mental condition  F51.05    mood    4. Tobacco use disorder, mild, in early remission  F17.201     5. Hx of borderline personality disorder  Z86.59     6. Cocaine use disorder, moderate, in sustained remission ([place])  F14.21       Past Psychiatric History: Reviewed past psychiatric history from progress note from 07/27/2017.  Past Medical History:  Past Medical History:  Diagnosis Date   Anxiety    Arthritis    Bipolar 1 disorder ([place])    Depression    Family history of adverse reaction to anesthesia    half  sister claims that she would stop breathing during surgery x 2   Hypertension    Osteoarthritis    PONV (postoperative nausea and vomiting)    Pre-diabetes     Past Surgical History:  Procedure Laterality Date   ANTERIOR CRUCIATE LIGAMENT (ACL) REVISION Right    CARPAL TUNNEL RELEASE     COLONOSCOPY     COLONOSCOPY WITH PROPOFOL N/A 11/17/2017   Procedure: COLONOSCOPY WITH PROPOFOL;  Surgeon: Virgel Manifold, MD;  Location: [facility] ENDOSCOPY;  Service: Endoscopy;   Laterality: N/A;   DILATION AND CURETTAGE OF UTERUS  2013   for heavy bleeding   ENDOMETRIAL ABLATION  2013   KNEE ARTHROPLASTY Right 10/02/2020   Procedure: COMPUTER ASSISTED TOTAL KNEE ARTHROPLASTY;  Surgeon: Dereck Leep, MD;  Location: [facility] ORS;  Service: Orthopedics;  Laterality: Right;   right wrist surgery     TUBAL LIGATION     WRIST ARTHROSCOPY Left 1990   for degenerative changes    Family Psychiatric History: Reviewed family psychiatric history from progress note on 07/27/2017.  Family History:  Family History  Problem Relation Age of Onset   Diabetes Mother    Depression Mother    Hypertension Mother    Crohn's disease Mother    Lung cancer Mother 33       former smoker   Congestive Heart Failure Father    Breast cancer Sister 86   Alcohol abuse Sister    Drug abuse Sister    Anxiety disorder Sister    Depression Sister    Bipolar disorder Sister    Alcohol abuse Brother    Drug abuse Brother    Anxiety disorder Brother    Depression Brother    Colon polyps Brother    HIV Brother    Alcohol abuse Brother    Drug abuse Brother    Anxiety disorder Brother    Depression Brother    Colon polyps Brother    HIV Brother    Skin cancer Maternal Grandfather    Colon cancer Neg Hx    Ovarian cancer Neg Hx    Cervical cancer Neg Hx     Social History: Reviewed social history from progress note on 07/27/2017. Social History   Socioeconomic History   Marital status: Divorced    Spouse name: Not on file   Number of children: 1   Years of education: Not on file   Highest education level: Some college, no degree  Occupational History   Occupation: disabled    Comment: on SSI awaiting decision about disability  Tobacco Use   Smoking status: Every Day    Packs/day: 0.50    Years: 45.00    Pack years: 22.50    Types: Cigarettes   Smokeless tobacco: Never   Tobacco comments:    started smoking at age 50  Vaping Use   Vaping Use: Former  Substance and  Sexual Activity   Alcohol use: Yes    Comment: 1 times per month   Drug use: Not Currently    Comment: former crack cocaine user   Sexual activity: Yes    Partners: Male    Birth control/protection: Post-menopausal, Surgical  Other Topics Concern   Not on file  Social History Narrative   Live with brother and his husband   Social Determinants of Health   Financial Resource Strain: Not on file  Food Insecurity: Not on file  Transportation Needs: Not on file  Physical Activity: Not on file  Stress: Not on  file  Social Connections: Not on file    Allergies:  Allergies  Allergen Reactions   Pregabalin Other (See Comments)    Other reaction(s): Other (See Comments) Suicidal ideation  Suicidal ideation    Metabolic Disorder Labs: Lab Results  Component Value Date   HGBA1C 5.2 12/02/2020   No results found for: PROLACTIN Lab Results  Component Value Date   CHOL 154 12/02/2020   TRIG 161 (H) 12/02/2020   HDL 59 12/02/2020   CHOLHDL 2.6 12/02/2020   LDLCALC 68 12/02/2020   LDLCALC 77 05/27/2020   Lab Results  Component Value Date   TSH 0.801 12/02/2020   TSH 0.983 11/27/2019    Therapeutic Level Labs: No results found for: LITHIUM No results found for: VALPROATE No components found for:  CBMZ  Current Medications: Current Outpatient Medications  Medication Sig Dispense Refill   amLODipine (NORVASC) 10 MG tablet TAKE 1 TABLET BY MOUTH EVERY DAY 90 tablet 1   KLOR-CON M20 20 MEQ tablet TAKE 1 TABLET BY MOUTH EVERY DAY 90 tablet 1   meloxicam (MOBIC) 15 MG tablet TAKE 1 TABLET BY MOUTH EVERY DAY 30 tablet 2   meloxicam (MOBIC) 15 MG tablet Take by mouth.     methocarbamol (ROBAXIN) 500 MG tablet Take 500 mg by mouth 2 (two) times daily.     rosuvastatin (CRESTOR) 5 MG tablet TAKE 1 TABLET BY MOUTH EVERY OTHER DAY 45 tablet 3   triamterene-hydrochlorothiazide (MAXZIDE-25) 37.5-25 MG tablet TAKE 1 TABLET BY MOUTH EVERY DAY 90 tablet 0   vitamin B-12  (CYANOCOBALAMIN) 1000 MCG tablet Take 1,000 mcg by mouth daily.     lamoTRIgine (LAMICTAL) 150 MG tablet Take 1 tablet (150 mg total) by mouth daily. 90 tablet 1   Ubiquinol 100 MG CAPS Take 100 mg by mouth daily. (Patient not taking: Reported on 04/16/2021)     No current facility-administered medications for this visit.     Musculoskeletal: Strength & Muscle Tone:  UTA Gait & Station:  Seated Patient leans: N/A  Psychiatric Specialty Exam: Review of Systems  Musculoskeletal:        S/P knee surgery rt.  All other systems reviewed and are negative.  Last menstrual period 12/07/2011.There is no height or weight on file to calculate BMI.  General Appearance: Casual  Eye Contact:  Good  Speech:  Clear and Coherent  Volume:  Normal  Mood:  Euthymic  Affect:  Congruent  Thought Process:  Goal Directed and Descriptions of Associations: Intact  Orientation:  Full (Time, Place, and Person)  Thought Content: Logical   Suicidal Thoughts:  No  Homicidal Thoughts:  No  Memory:  Immediate;   Fair Recent;   Fair Remote;   Fair  Judgement:  Fair  Insight:  Fair  Psychomotor Activity:  Normal  Concentration:  Concentration: Fair and Attention Span: Fair  Recall:  [employer] of Knowledge: Fair  Language: Fair  Akathisia:  No  Handed:  Right  AIMS (if indicated): done, 0  Assets:  Communication Skills Desire for Improvement Housing Social Support  ADL's:  Intact  Cognition: WNL  Sleep:  Fair   Screenings: GAD-7    [place] Office Visit from 01/14/2021 in [place] Video Visit from 01/22/2020 in [place] Office Visit from 07/13/2017 in [place]  Total GAD-7 Score 1 15 12       PHQ2-9    Flowsheet Row Video Visit from 04/16/2021 in [place] Office Visit  from 01/14/2021 in [place] Office Visit from 12/02/2020 in [facility] Video Visit from 09/16/2020 in [facility] Video Visit from 06/25/2020 in [facility]  PHQ-2 Total Score 0 0 0 0 1  PHQ-9 Total Score -- -- 2 1 --      Flowsheet Row Video Visit from 04/16/2021 in [place] Admission (Discharged) from 10/02/2020 in [facility] (1A) Pre-Admission Testing 60 from 09/24/2020 in [place] TESTING  C-SSRS RISK CATEGORY Low Risk No Risk No Risk        Assessment and Plan: FERNANDE TREIBER is a 61 year old Caucasian female, divorced, on SSI, lives in [place], has a history of bipolar disorder, borderline personality disorder, insomnia was evaluated by telemedicine today.  Patient is status post right total knee replacement, currently recovering.  Discussed plan as noted below.  Plan Bipolar disorder in remission Lamotrigine 150 mg p.o. daily  Insomnia-stable Continue sleep hygiene techniques  GAD-stable Continue CBT as needed  Tobacco use disorder-improving Patient is currently cutting back, provided smoking cessation counseling for 5 minutes. She does have nicotine patches available.  Follow-up in clinic in 3 months or sooner if needed.  This note was generated in part or whole with voice recognition software. Voice recognition is usually quite accurate but there are transcription errors that can and very often do occur. I apologize for any typographical errors that were not detected and corrected.       Ursula Alert, MD 04/17/2021, 6:30 PM

## 2021-05-16 ENCOUNTER — Other Ambulatory Visit: Payer: Self-pay | Admitting: Family Medicine

## 2021-05-16 NOTE — Telephone Encounter (Signed)
Requested Prescriptions  ?Pending Prescriptions Disp Refills  ?? triamterene-hydrochlorothiazide (MAXZIDE-25) 37.5-25 MG tablet [Pharmacy Med Name: TRIAMTERENE-HCTZ 37.5-25 MG TB] 90 tablet 0  ?  Sig: TAKE 1 TABLET BY MOUTH EVERY DAY  ?  ? Cardiovascular: Diuretic Combos Passed - 05/16/2021  9:04 AM  ?  ?  Passed - K in normal range and within 180 days  ?  Potassium  ?Date Value Ref Range Status  ?01/03/2021 3.7 3.5 - 5.2 mmol/L Final  ?   ?  ?  Passed - Na in normal range and within 180 days  ?  Sodium  ?Date Value Ref Range Status  ?01/03/2021 140 134 - 144 mmol/L Final  ?   ?  ?  Passed - Cr in normal range and within 180 days  ?  Creatinine, Ser  ?Date Value Ref Range Status  ?01/03/2021 0.75 0.57 - 1.00 mg/dL Final  ?   ?  ?  Passed - Last BP in normal range  ?  BP Readings from Last 1 Encounters:  ?12/02/20 136/83  ?   ?  ?  Passed - Valid encounter within last 6 months  ?  Recent Outpatient Visits   ?      ? 5 months ago Encounter for annual physical exam  ? [facility] [place], Dionne Bucy, MD  ? 10 months ago Left hip pain  ? [place], PA-C  ? 11 months ago Essential hypertension  ? [facility] [place], Dionne Bucy, MD  ? 1 year ago Myalgia due to statin  ? [facility] [place], Dionne Bucy, MD  ? 1 year ago Acute pain of right shoulder  ? [facility], Kirstie Peri, MD  ?  ?  ?Future Appointments   ?        ? In 2 weeks [place], Dionne Bucy, MD [facility]  ?  ? ?  ?  ?  ? ?

## 2021-05-19 ENCOUNTER — Telehealth: Payer: Self-pay

## 2021-05-19 DIAGNOSIS — F3161 Bipolar disorder, current episode mixed, mild: Secondary | ICD-10-CM

## 2021-05-19 MED ORDER — LAMOTRIGINE 25 MG PO TABS: 25.0000 mg | ORAL_TABLET | Freq: Every day | ORAL | 1 refills | Status: DC

## 2021-05-19 NOTE — Telephone Encounter (Signed)
Returned call to patient, patient currently reports irritability, impulsivity since the past few days.  Reports sleep is good.  Discussed adding Lamictal 25 mg to 150 mg daily.  Patient agrees with plan.  Patient advised to call back and schedule an appointment if she continues to have worsening mood symptoms. ?

## 2021-05-19 NOTE — Telephone Encounter (Signed)
pt called she states that she is more manic , and she needs something to "give her a extra kick" can you please call her back.  ?

## 2021-05-26 IMAGING — CR DG HIP (WITH OR WITHOUT PELVIS) 2-3V*L*
1 series · 3 of 3 positions shown · non-contrast
Comparison: None.

CLINICAL DATA: Left anterior hip pain 2 weeks.  No known injury.

EXAM:
DG HIP (WITH OR WITHOUT PELVIS) 2-3V LEFT

[Series 1: dg hip unilat w or w/o pelvis 2-3 views  · non-contrast · 0.14mm/px · 3 of 3 slices shown]
[im 1/3]
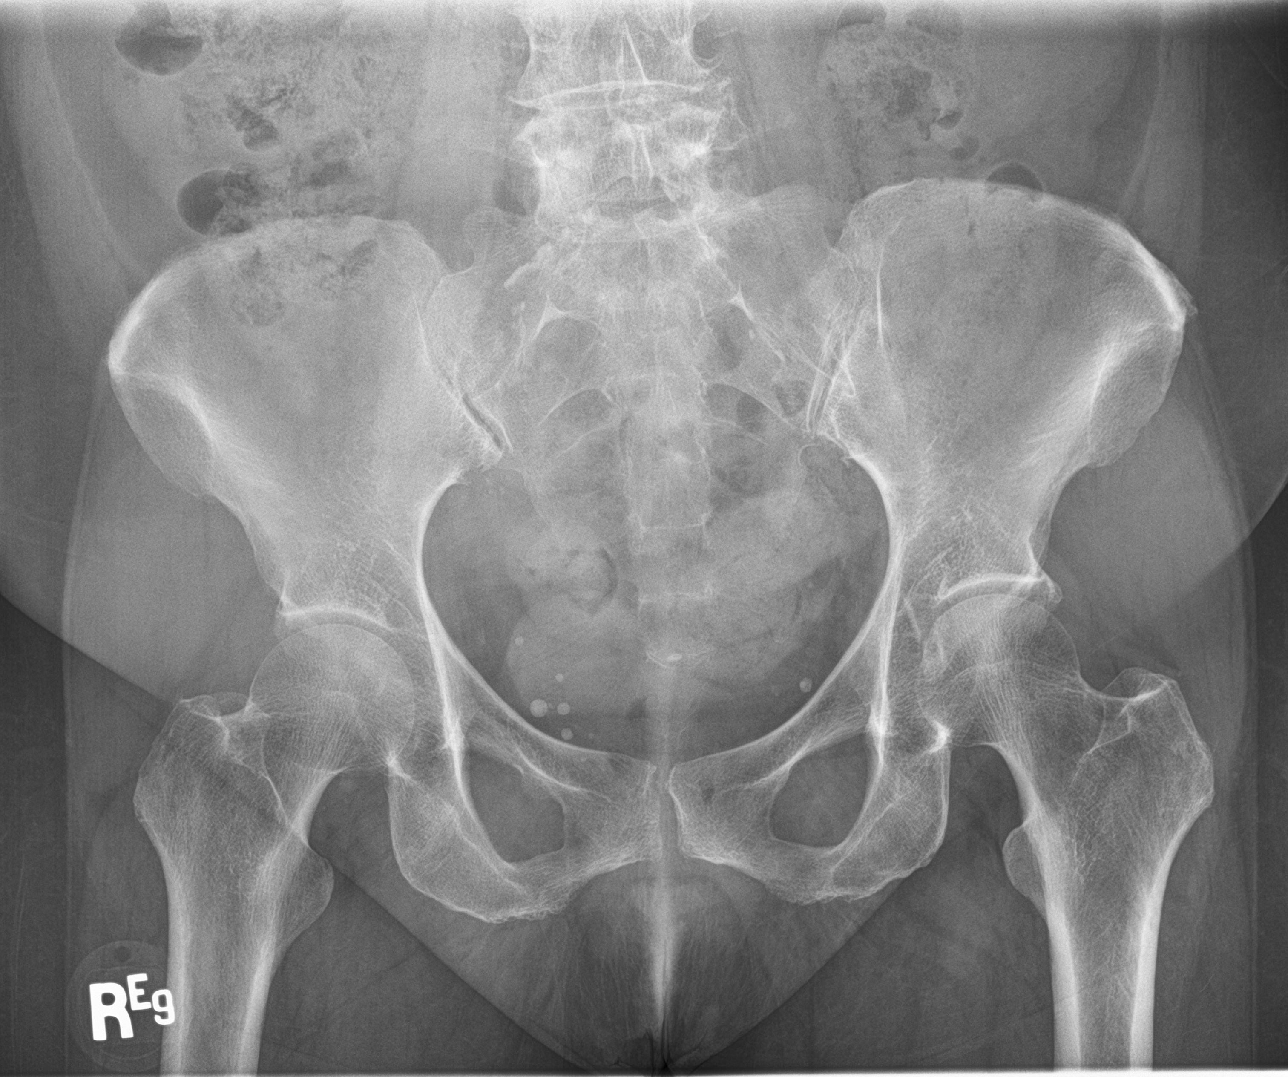
[im 2/3]
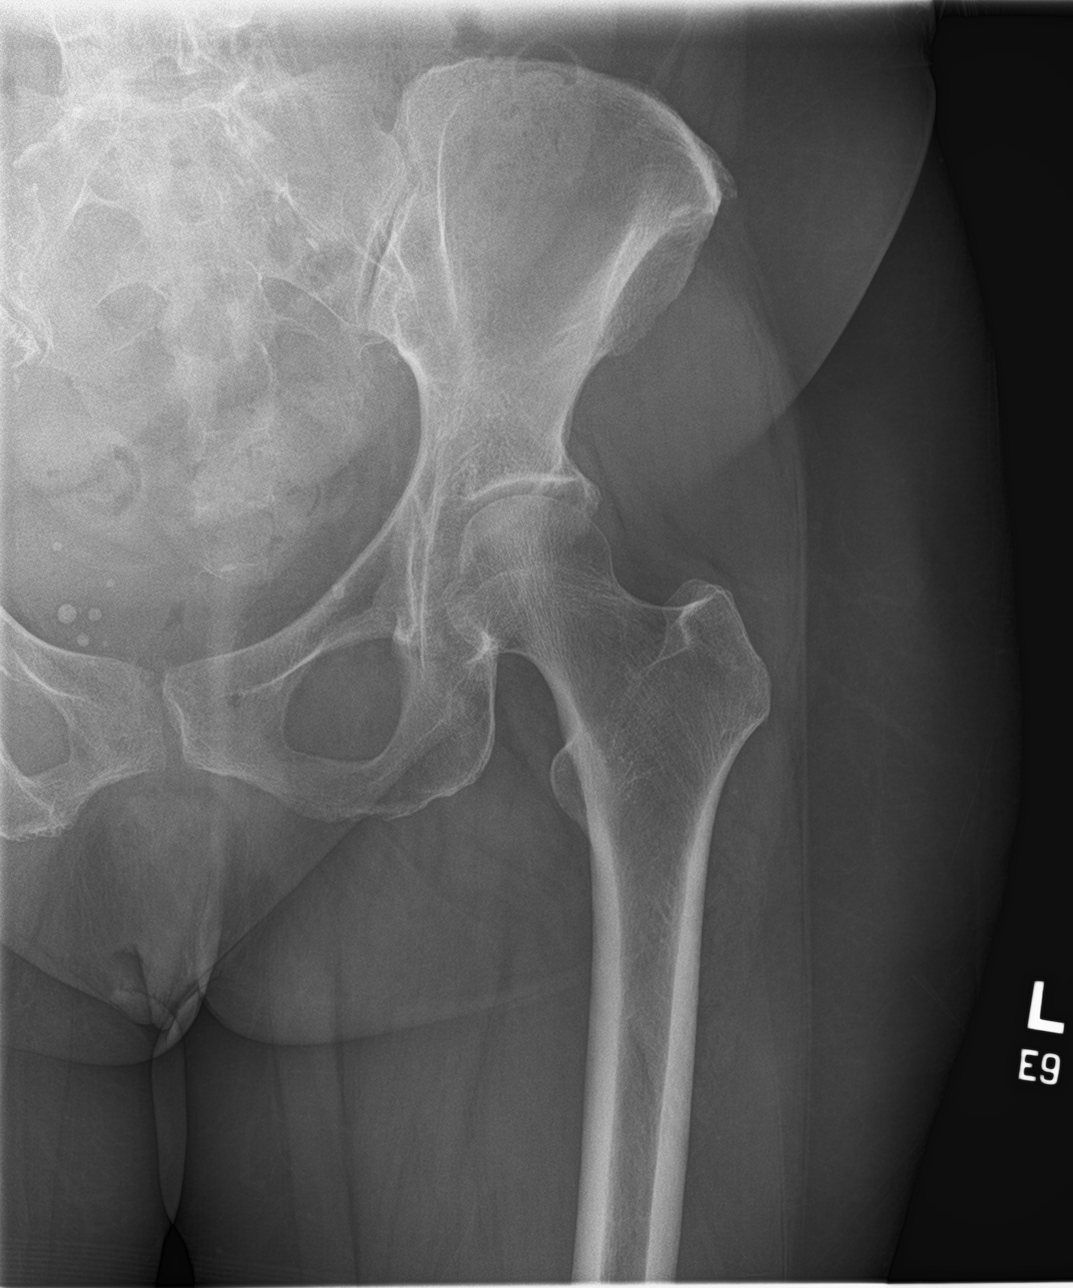
[im 3/3]
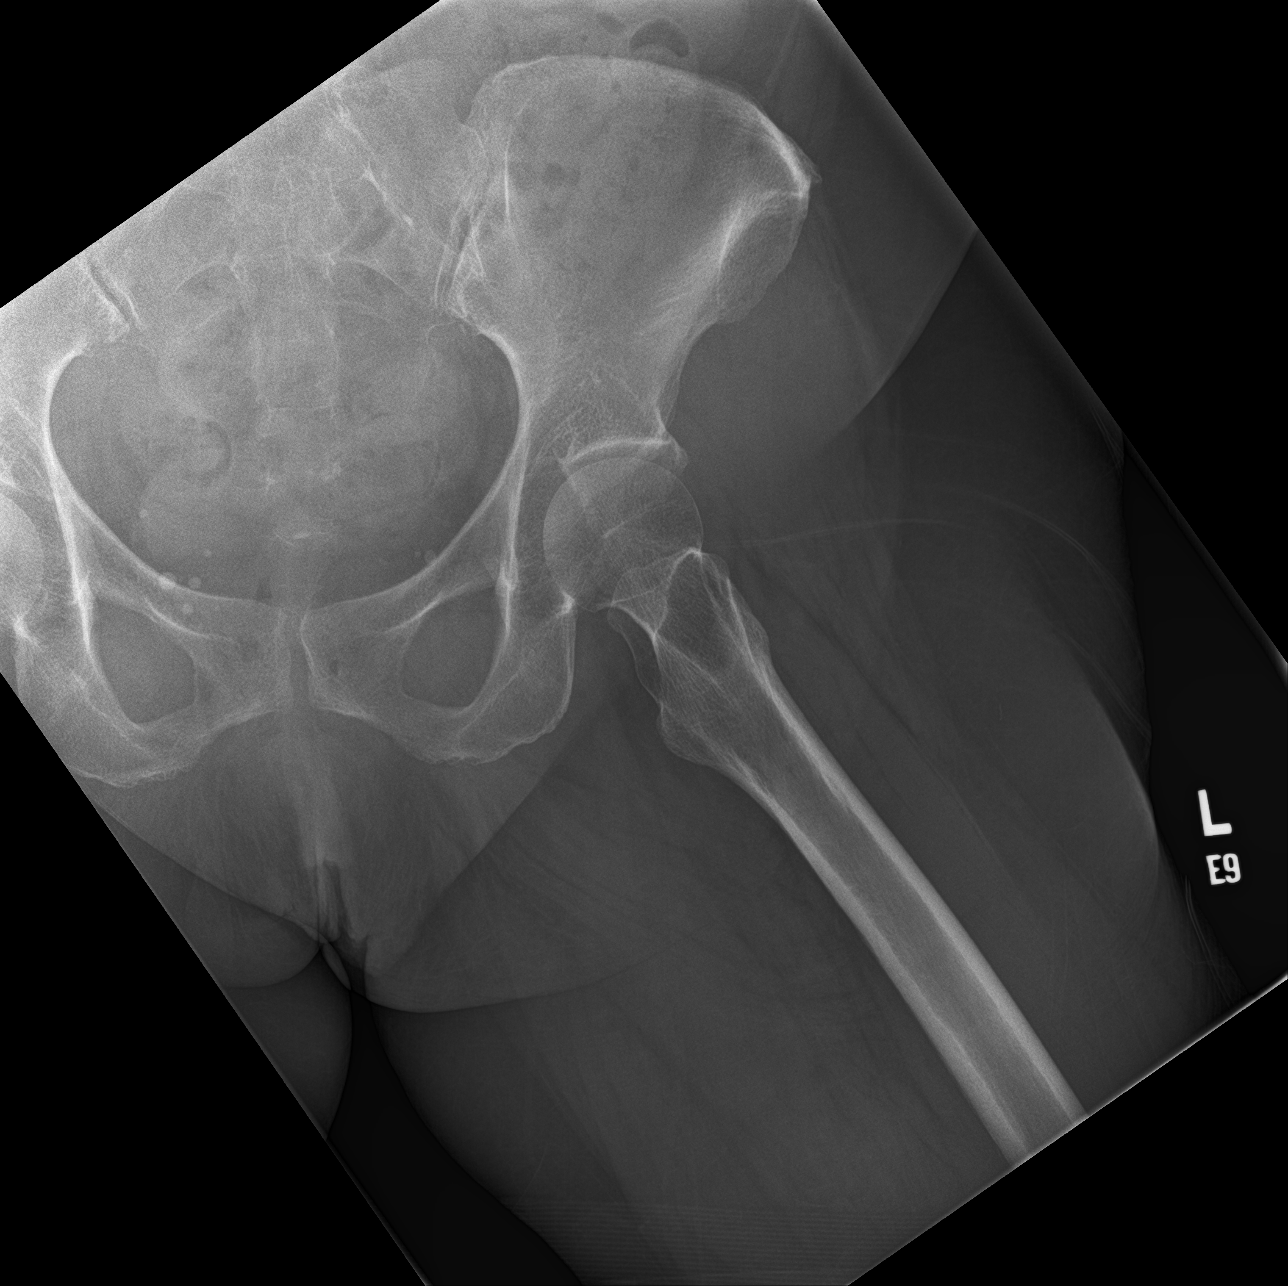

[3 of 3 positions shown; findings below may reference images not displayed]

FINDINGS: There is no evidence of hip fracture or dislocation. Hip joint space
is preserved. There is no evidence of arthropathy or other focal
bone abnormality.
IMPRESSION: Negative.

## 2021-05-30 NOTE — Progress Notes (Signed)
?  ? ? ?Established patient visit ? ? ?Patient: Alexa Newman   DOB: 05/02/1960   61 y.o. Female  MRN: [id number] ?Visit Date: 06/02/2021 ? ?Today's healthcare provider: [name], MD  ? ?Chief Complaint  ?Patient presents with  ? Hypertension  ? Hyperlipidemia  ? Prediabetes  ? ?Subjective  ?  ?HPI  ?Lipid/Cholesterol, Follow-up ? ?Last lipid panel Other pertinent labs  ?Lab Results  ?Component Value Date  ? CHOL 154 12/02/2020  ? HDL 59 12/02/2020  ? [place] 68 12/02/2020  ? TRIG 161 (H) 12/02/2020  ? CHOLHDL 2.6 12/02/2020  ? Lab Results  ?Component Value Date  ? ALT 13 01/03/2021  ? AST 14 01/03/2021  ? PLT 351 12/02/2020  ? TSH 0.801 12/02/2020  ?  ? ?She was last seen for this 6 months ago.  ?Management since that visit includes no changes. ? ?She reports excellent compliance with treatment. ?She is not having side effects. ? ?Symptoms: ?No chest pain No chest pressure/discomfort  ?No dyspnea Yes lower extremity edema  ?Yes numbness or tingling of extremity No orthopnea  ?No palpitations No paroxysmal nocturnal dyspnea  ?No speech difficulty No syncope  ? ?Current diet: well balanced ?Current exercise: no regular exercise ? ?The 10-year ASCVD risk score (Arnett DK, et al., 2019) is: 7.5% ? ?--------------------------------------------------------------------------------------------------- ?Hypertension, follow-up ? ?BP Readings from Last 3 Encounters:  ?06/02/21 131/80  ?12/02/20 136/83  ?10/05/20 117/69  ? Wt Readings from Last 3 Encounters:  ?06/02/21 177 lb 12.8 oz (80.6 kg)  ?12/02/20 175 lb 9.6 oz (79.7 kg)  ?10/02/20 177 lb 7.5 oz (80.5 kg)  ?  ? ?She was last seen for hypertension 6 months ago.  ?BP at that visit was 136/83. Management since that visit includes no changes. ? ?She reports excellent compliance with treatment. ?She is not having side effects.  ?She is following a Regular diet. ?She is not exercising. ?She does smoke. ? ?Use of agents associated with hypertension: none.  ? ?Outside  blood pressures are <130/80. ?Symptoms: ?No chest pain No chest pressure  ?No palpitations No syncope  ?No dyspnea No orthopnea  ?No paroxysmal nocturnal dyspnea No lower extremity edema  ? ?Pertinent labs: ?Lab Results  ?Component Value Date  ? CHOL 154 12/02/2020  ? HDL 59 12/02/2020  ? [place] 68 12/02/2020  ? TRIG 161 (H) 12/02/2020  ? CHOLHDL 2.6 12/02/2020  ? Lab Results  ?Component Value Date  ? NA 140 01/03/2021  ? K 3.7 01/03/2021  ? CREATININE 0.75 01/03/2021  ? EGFR 92 01/03/2021  ? GLUCOSE 100 (H) 01/03/2021  ? TSH 0.801 12/02/2020  ?  ? ?The 10-year ASCVD risk score (Arnett DK, et al., 2019) is: 7.5%  ? ?--------------------------------------------------------------------------------------------------- ?Prediabetes, Follow-up ? ?Lab Results  ?Component Value Date  ? HGBA1C 5.8 (A) 06/02/2021  ? HGBA1C 5.2 12/02/2020  ? HGBA1C 5.8 (A) 05/27/2020  ? GLUCOSE 100 (H) 01/03/2021  ? GLUCOSE 97 12/02/2020  ? GLUCOSE 106 (H) 09/24/2020  ? ? ?Last seen for for this6 months ago.  ?Management since that visit includes no changes. ?Current symptoms include increase appetite and have been unchanged. ? ?Prior visit with dietician: no ?Current diet: well balanced ?Current exercise: no regular exercise ? ?Pertinent Labs: ?   ?Component Value Date/Time  ? CHOL 154 12/02/2020 1549  ? TRIG 161 (H) 12/02/2020 1549  ? CHOLHDL 2.6 12/02/2020 1549  ? CREATININE 0.75 01/03/2021 1105  ? ? ?Wt Readings from Last 3 Encounters:  ?06/02/21 177 lb 12.8  oz (80.6 kg)  ?12/02/20 175 lb 9.6 oz (79.7 kg)  ?10/02/20 177 lb 7.5 oz (80.5 kg)  ? ? ?----------------------------------------------------------------------------------------- ?Low back pain since she had her R TKA (7/22). Pops a lot. Thought it was due to walking differently but it persists. Now walking with a cane. States that she feels miserable.  No radiation, numbness, weakness. ? ?Working on quitting smoking. Down to 3-6 cigarettes per day.  Daughter is her motivation as she  quit also.  Using patches. ? ?Medications: ?Outpatient Medications Prior to Visit  ?Medication Sig  ? amLODipine (NORVASC) 10 MG tablet TAKE 1 TABLET BY MOUTH EVERY DAY  ? KLOR-CON M20 20 MEQ tablet TAKE 1 TABLET BY MOUTH EVERY DAY  ? lamoTRIgine (LAMICTAL) 150 MG tablet Take 1 tablet (150 mg total) by mouth daily.  ? lamoTRIgine (LAMICTAL) 25 MG tablet Take 1 tablet (25 mg total) by mouth daily. Take along with 150 mg daily  ? meloxicam (MOBIC) 15 MG tablet Take by mouth.  ? methocarbamol (ROBAXIN) 500 MG tablet Take 500 mg by mouth 2 (two) times daily.  ? rosuvastatin (CRESTOR) 5 MG tablet TAKE 1 TABLET BY MOUTH EVERY OTHER DAY  ? triamterene-hydrochlorothiazide (MAXZIDE-25) 37.5-25 MG tablet TAKE 1 TABLET BY MOUTH EVERY DAY  ? vitamin B-12 (CYANOCOBALAMIN) 1000 MCG tablet Take 1,000 mcg by mouth daily.  ? [DISCONTINUED] meloxicam (MOBIC) 15 MG tablet TAKE 1 TABLET BY MOUTH EVERY DAY  ? [DISCONTINUED] Ubiquinol 100 MG CAPS Take 100 mg by mouth daily. (Patient not taking: Reported on 04/16/2021)  ? ?No facility-administered medications prior to visit.  ? ? ?Review of Systems per HPI ? ? ?  Objective  ?  ?BP 131/80   Pulse 100   Temp 97.9 ?F (36.6 ?C)   Resp 16   Wt 177 lb 12.8 oz (80.6 kg)   LMP 12/07/2011   SpO2 100%   BMI 30.52 kg/m?  ? ? ?Physical Exam ?Vitals reviewed.  ?Constitutional:   ?   General: She is not in acute distress. ?   Appearance: Normal appearance. She is well-developed. She is not diaphoretic.  ?HENT:  ?   Head: Normocephalic and atraumatic.  ?Eyes:  ?   General: No scleral icterus. ?   Conjunctiva/sclera: Conjunctivae normal.  ?Neck:  ?   Thyroid: No thyromegaly.  ?Cardiovascular:  ?   Rate and Rhythm: Normal rate and regular rhythm.  ?   Pulses: Normal pulses.  ?   Heart sounds: Normal heart sounds. No murmur heard. ?Pulmonary:  ?   Effort: Pulmonary effort is normal. No respiratory distress.  ?   Breath sounds: Normal breath sounds. No wheezing, rhonchi or rales.  ?Musculoskeletal:  ?    Cervical back: Neck supple.  ?   Right lower leg: No edema.  ?   Left lower leg: No edema.  ?   Comments: Back: No midline TTP, ROM grossly intact. Negative SLR bilaterally.  Strength and sensation to light touch intact in lower extremities.  ?Lymphadenopathy:  ?   Cervical: No cervical adenopathy.  ?Skin: ?   General: Skin is warm and dry.  ?   Findings: No rash.  ?Neurological:  ?   Mental Status: She is alert and oriented to person, place, and time. Mental status is at baseline.  ?Psychiatric:     ?   Mood and Affect: Mood normal.     ?   Behavior: Behavior normal.  ?  ? ? ?Results for orders placed or performed in visit on  06/02/21  ?POCT glycosylated hemoglobin (Hb A1C)  ?Result Value Ref Range  ? Hemoglobin A1C 5.8 (A) 4.0 - 5.6 %  ? HbA1c POC (<> result, manual entry)    ? HbA1c, POC (prediabetic range)    ? HbA1c, POC (controlled diabetic range)    ? ? Assessment & Plan  ?  ? ?Problem List Items Addressed This Visit   ? ?  ? Cardiovascular and Mediastinum  ? Essential hypertension - Primary  ?  Well controlled ?Continue current medications ?Recheck metabolic panel ?F/u in 6 months  ?  ?  ? Relevant Orders  ? Basic Metabolic Panel (BMET)  ?  ? Other  ? Tobacco use disorder  ?  Congratulated on working on cutting back and working on cessation ?Continue nicotine patches ?  ?  ? Obesity  ?  Discussed importance of healthy weight management ?Discussed diet and exercise  ?  ?  ? Prediabetes  ?  Recommend low carb diet ?Reviewed A1c with patient ?  ?  ? Relevant Orders  ? POCT glycosylated hemoglobin (Hb A1C) (Completed)  ? Hyperlipidemia  ?  Previously well controlled ?Continue statin ?Repeat FLP and CMP annually ?  ?  ? Acute right-sided low back pain without sciatica  ?  Ongoing x9 m since R TKA ?walking with a cane ?No red flags in hisotry or exam ?XRays of L spine ?Referral to Ortho ?Continue muscle relaxer and mobic per Ortho ?Ok to add tylenol if needed ?HEP given ?Consider PT ?  ?  ? Relevant Orders  ?  Ambulatory referral to Orthopedic Surgery  ? DG Lumbar Spine Complete  ?  ? ?Return in about 6 months (around 12/03/2021) for CPE.  ?   ? ?[employer] as a Education administrator for [name], MD.,hav

## 2021-06-02 ENCOUNTER — Encounter: Payer: Self-pay | Admitting: Family Medicine

## 2021-06-02 ENCOUNTER — Ambulatory Visit: Payer: Medicaid Other | Admitting: Family Medicine

## 2021-06-02 ENCOUNTER — Other Ambulatory Visit: Payer: Self-pay

## 2021-06-02 VITALS — BP 131/80 | HR 100 | Temp 97.9°F | Resp 16 | Wt 177.8 lb

## 2021-06-02 DIAGNOSIS — R7303 Prediabetes: Secondary | ICD-10-CM | POA: Diagnosis not present

## 2021-06-02 DIAGNOSIS — I10 Essential (primary) hypertension: Secondary | ICD-10-CM | POA: Diagnosis not present

## 2021-06-02 DIAGNOSIS — E669 Obesity, unspecified: Secondary | ICD-10-CM

## 2021-06-02 DIAGNOSIS — E782 Mixed hyperlipidemia: Secondary | ICD-10-CM

## 2021-06-02 DIAGNOSIS — F17200 Nicotine dependence, unspecified, uncomplicated: Secondary | ICD-10-CM | POA: Diagnosis not present

## 2021-06-02 DIAGNOSIS — M545 Low back pain, unspecified: Secondary | ICD-10-CM | POA: Insufficient documentation

## 2021-06-02 DIAGNOSIS — Z6830 Body mass index (BMI) 30.0-30.9, adult: Secondary | ICD-10-CM

## 2021-06-02 LAB — POCT GLYCOSYLATED HEMOGLOBIN (HGB A1C): Hemoglobin A1C: 5.8 % — AB (ref 4.0–5.6)

## 2021-06-02 NOTE — Assessment & Plan Note (Signed)
Recommend low carb diet ?Reviewed A1c with patient ?

## 2021-06-02 NOTE — Assessment & Plan Note (Signed)
Well controlled Continue current medications Recheck metabolic panel F/u in 6 months  

## 2021-06-02 NOTE — Assessment & Plan Note (Signed)
Congratulated on working on cutting back and working on cessation ?Continue nicotine patches ?

## 2021-06-02 NOTE — Assessment & Plan Note (Signed)
Ongoing x9 m since R TKA ?walking with a cane ?No red flags in hisotry or exam ?XRays of L spine ?Referral to Ortho ?Continue muscle relaxer and mobic per Ortho ?Ok to add tylenol if needed ?HEP given ?Consider PT ?

## 2021-06-02 NOTE — Assessment & Plan Note (Signed)
Discussed importance of healthy weight management Discussed diet and exercise  

## 2021-06-02 NOTE — Assessment & Plan Note (Signed)
Previously well controlled Continue statin Repeat FLP and CMP annually 

## 2021-06-03 ENCOUNTER — Ambulatory Visit
Admission: RE | Admit: 2021-06-03 | Discharge: 2021-06-03 | Disposition: A | Discharge status: Home / Self Care | Payer: Medicaid Other | Source: Ambulatory Visit | Attending: Family Medicine | Admitting: Family Medicine

## 2021-06-03 DIAGNOSIS — I10 Essential (primary) hypertension: Secondary | ICD-10-CM | POA: Diagnosis not present

## 2021-06-03 DIAGNOSIS — M545 Low back pain, unspecified: Secondary | ICD-10-CM | POA: Diagnosis not present

## 2021-06-03 DIAGNOSIS — M4316 Spondylolisthesis, lumbar region: Secondary | ICD-10-CM | POA: Diagnosis not present

## 2021-06-04 LAB — BASIC METABOLIC PANEL
BUN/Creatinine Ratio: 18 (ref 12–28)
BUN: 15 mg/dL (ref 8–27)
CO2: 25 mmol/L (ref 20–29)
Calcium: 10.3 mg/dL (ref 8.7–10.3)
Chloride: 102 mmol/L (ref 96–106)
Creatinine, Ser: 0.82 mg/dL (ref 0.57–1.00)
Glucose: 97 mg/dL (ref 70–99)
Potassium: 3.7 mmol/L (ref 3.5–5.2)
Sodium: 140 mmol/L (ref 134–144)
eGFR: 82 mL/min/{1.73_m2} (ref >59)

## 2021-06-10 ENCOUNTER — Other Ambulatory Visit: Payer: Self-pay | Admitting: Family Medicine

## 2021-06-10 DIAGNOSIS — Z1231 Encounter for screening mammogram for malignant neoplasm of breast: Secondary | ICD-10-CM

## 2021-06-11 ENCOUNTER — Other Ambulatory Visit: Payer: Self-pay | Admitting: Psychiatry

## 2021-06-11 DIAGNOSIS — F3161 Bipolar disorder, current episode mixed, mild: Secondary | ICD-10-CM

## 2021-06-13 ENCOUNTER — Other Ambulatory Visit: Payer: Self-pay | Admitting: Psychiatry

## 2021-06-13 DIAGNOSIS — F3161 Bipolar disorder, current episode mixed, mild: Secondary | ICD-10-CM

## 2021-06-19 ENCOUNTER — Encounter: Payer: Self-pay | Admitting: Psychiatry

## 2021-06-19 ENCOUNTER — Telehealth (INDEPENDENT_AMBULATORY_CARE_PROVIDER_SITE_OTHER): Payer: Medicaid Other | Admitting: Psychiatry

## 2021-06-19 DIAGNOSIS — F5105 Insomnia due to other mental disorder: Secondary | ICD-10-CM

## 2021-06-19 DIAGNOSIS — F17201 Nicotine dependence, unspecified, in remission: Secondary | ICD-10-CM | POA: Diagnosis not present

## 2021-06-19 DIAGNOSIS — F3161 Bipolar disorder, current episode mixed, mild: Secondary | ICD-10-CM | POA: Diagnosis not present

## 2021-06-19 DIAGNOSIS — Z8659 Personal history of other mental and behavioral disorders: Secondary | ICD-10-CM

## 2021-06-19 DIAGNOSIS — F1421 Cocaine dependence, in remission: Secondary | ICD-10-CM

## 2021-06-19 DIAGNOSIS — F411 Generalized anxiety disorder: Secondary | ICD-10-CM | POA: Diagnosis not present

## 2021-06-19 MED ORDER — ZOLPIDEM TARTRATE 5 MG PO TABS: 5.0000 mg | ORAL_TABLET | Freq: Every evening | ORAL | 1 refills | Status: DC | PRN

## 2021-06-19 MED ORDER — LAMOTRIGINE 100 MG PO TABS: 100.0000 mg | ORAL_TABLET | Freq: Two times a day (BID) | ORAL | 0 refills | Status: DC

## 2021-06-19 NOTE — Progress Notes (Signed)
Virtual Visit via Video Note ? ?I connected with Alexa Newman on 06/19/21 at  3:20 PM EDT by a video enabled telemedicine application and verified that I am speaking with the correct person using two identifiers. ? ?Location ?Provider Location : [facility] ?Patient Location : Home ? ?Participants: Patient , Provider ? ?  ?I discussed the limitations of evaluation and management by telemedicine and the availability of in person appointments. The patient expressed understanding and agreed to proceed. ? ?  ?I discussed the assessment and treatment plan with the patient. The patient was provided an opportunity to ask questions and all were answered. The patient agreed with the plan and demonstrated an understanding of the instructions. ?  ?The patient was advised to call back or seek an in-person evaluation if the symptoms worsen or if the condition fails to improve as anticipated. ? ? ? ?[place] MD OP Progress Note ? ?06/19/2021 3:38 PM ?Alexa Newman  ?MRN:  [id number] ? ?Chief Complaint:  ?Chief Complaint  ?Patient presents with  ? Follow-up : 61 year old Caucasian female with history of bipolar disorder, GAD, insomnia, borderline personality disorder, tobacco use disorder was evaluated for medication management.  ? ?HPI: Alexa Newman is a 61 year old Caucasian female, divorced, on SSI, lives in [place], has a history of bipolar disorder, borderline personality disorder, cocaine use disorder, GAD, insomnia, tobacco use disorder was evaluated by telemedicine today. ? ?Patient today reports she is currently struggling with mood lability, sadness, irritability, crying spells, impulsivity as well as sleep problems.  This has been going on since the past several weeks.  The dosage increase of lamotrigine has helped to some extent.  She is currently on 175 mg of Lamictal.  She is interested in a dosage increase.  Tolerating the Lamictal well. ? ?Patient reports she is also worried about her sister who is currently dying.   Patient reports good support system from her brother.  She is not interested in psychotherapy at this time however will think about it. ? ?Currently trying to quit smoking.  She barely smokes now, likely that could also be contributing to mood symptoms .However wants to stay away from smoking for now. ? ?Patient denies any suicidality, homicidality or perceptual disturbances. ? ?Patient denies any other concerns today. ? ?Visit Diagnosis:  ?  ICD-10-CM   ?1. Bipolar 1 disorder, mixed, mild (HCC)  F31.61 lamoTRIgine (LAMICTAL) 100 MG tablet  ?  ?2. GAD (generalized anxiety disorder)  F41.1 lamoTRIgine (LAMICTAL) 100 MG tablet  ?  ?3. Insomnia due to mental condition  F51.05 zolpidem (AMBIEN) 5 MG tablet  ? Mood  ?  ?4. Tobacco use disorder, mild, in early remission  F17.201   ?  ?5. Hx of borderline personality disorder  Z86.59   ?  ?6. Cocaine use disorder, moderate, in sustained remission ([place])  F14.21   ?  ? ? ?Past Psychiatric History: Reviewed past psychiatric history from progress note on 07/27/2017. ? ?Past Medical History:  ?Past Medical History:  ?Diagnosis Date  ? Anxiety   ? Arthritis   ? Bipolar 1 disorder ([place])   ? Depression   ? Family history of adverse reaction to anesthesia   ? half sister claims that she would stop breathing during surgery x 2  ? Hypertension   ? Osteoarthritis   ? PONV (postoperative nausea and vomiting)   ? Pre-diabetes   ?  ?Past Surgical History:  ?Procedure Laterality Date  ? ANTERIOR CRUCIATE LIGAMENT (ACL) REVISION Right   ? CARPAL  TUNNEL RELEASE    ? COLONOSCOPY    ? COLONOSCOPY WITH PROPOFOL N/A 11/17/2017  ? Procedure: COLONOSCOPY WITH PROPOFOL;  Surgeon: Virgel Manifold, MD;  Location: [facility] ENDOSCOPY;  Service: Endoscopy;  Laterality: N/A;  ? [place] OF UTERUS  2013  ? for heavy bleeding  ? ENDOMETRIAL ABLATION  2013  ? KNEE ARTHROPLASTY Right 10/02/2020  ? Procedure: COMPUTER ASSISTED TOTAL KNEE ARTHROPLASTY;  Surgeon: Dereck Leep, MD;  Location:  [facility] ORS;  Service: Orthopedics;  Laterality: Right;  ? right wrist surgery    ? TUBAL LIGATION    ? WRIST ARTHROSCOPY Left 1990  ? for degenerative changes  ? ? ?Family Psychiatric History: Reviewed family psychiatric history from progress note on 07/27/2017. ? ?Family History:  ?Family History  ?Problem Relation Age of Onset  ? Diabetes Mother   ? Depression Mother   ? Hypertension Mother   ? Crohn's disease Mother   ? Lung cancer Mother 79  ?     former smoker  ? Congestive Heart Failure Father   ? Breast cancer Sister 31  ? Alcohol abuse Sister   ? Drug abuse Sister   ? Anxiety disorder Sister   ? Depression Sister   ? Bipolar disorder Sister   ? Alcohol abuse Brother   ? Drug abuse Brother   ? Anxiety disorder Brother   ? Depression Brother   ? Colon polyps Brother   ? HIV Brother   ? Alcohol abuse Brother   ? Drug abuse Brother   ? Anxiety disorder Brother   ? Depression Brother   ? Colon polyps Brother   ? HIV Brother   ? Skin cancer Maternal Grandfather   ? Colon cancer Neg Hx   ? Ovarian cancer Neg Hx   ? Cervical cancer Neg Hx   ? ? ?Social History: Reviewed social history from progress note on 07/27/2017. ?Social History  ? ?Socioeconomic History  ? Marital status: Divorced  ?  Spouse name: Not on file  ? Number of children: 1  ? Years of education: Not on file  ? Highest education level: Some college, no degree  ?Occupational History  ? Occupation: disabled  ?  Comment: on SSI awaiting decision about disability  ?Tobacco Use  ? Smoking status: Every Day  ?  Packs/day: 0.50  ?  Years: 45.00  ?  Pack years: 22.50  ?  Types: Cigarettes  ? Smokeless tobacco: Never  ? Tobacco comments:  ?  started smoking at age 24  ?Vaping Use  ? Vaping Use: Former  ?Substance and Sexual Activity  ? Alcohol use: Yes  ?  Comment: 1 times per month  ? Drug use: Not Currently  ?  Comment: former crack cocaine user  ? Sexual activity: Yes  ?  Partners: Male  ?  Birth control/protection: Post-menopausal, Surgical  ?Other Topics  Concern  ? Not on file  ?Social History Narrative  ? Live with brother and his husband  ? ?Social Determinants of Health  ? ?Financial Resource Strain: Not on file  ?Food Insecurity: Not on file  ?Transportation Needs: Not on file  ?Physical Activity: Not on file  ?Stress: Not on file  ?Social Connections: Not on file  ? ? ?Allergies:  ?Allergies  ?Allergen Reactions  ? Pregabalin Other (See Comments)  ?  Other reaction(s): Other (See Comments) ?Suicidal ideation ? ?Suicidal ideation  ? ? ?Metabolic Disorder Labs: ?Lab Results  ?Component Value Date  ? HGBA1C 5.8 (A) 06/02/2021  ? ?  No results found for: PROLACTIN ?Lab Results  ?Component Value Date  ? CHOL 154 12/02/2020  ? TRIG 161 (H) 12/02/2020  ? HDL 59 12/02/2020  ? CHOLHDL 2.6 12/02/2020  ? [place] 68 12/02/2020  ? [place] 77 05/27/2020  ? ?Lab Results  ?Component Value Date  ? TSH 0.801 12/02/2020  ? TSH 0.983 11/27/2019  ? ? ?Therapeutic Level Labs: ?No results found for: LITHIUM ?No results found for: VALPROATE ?No components found for:  CBMZ ? ?Current Medications: ?Current Outpatient Medications  ?Medication Sig Dispense Refill  ? lamoTRIgine (LAMICTAL) 100 MG tablet Take 1 tablet (100 mg total) by mouth 2 (two) times daily. 180 tablet 0  ? zolpidem (AMBIEN) 5 MG tablet Take 1 tablet (5 mg total) by mouth at bedtime as needed for sleep. 30 tablet 1  ? amLODipine (NORVASC) 10 MG tablet TAKE 1 TABLET BY MOUTH EVERY DAY 90 tablet 1  ? KLOR-CON M20 20 MEQ tablet TAKE 1 TABLET BY MOUTH EVERY DAY 90 tablet 1  ? meloxicam (MOBIC) 15 MG tablet Take by mouth.    ? methocarbamol (ROBAXIN) 500 MG tablet Take 500 mg by mouth 2 (two) times daily.    ? rosuvastatin (CRESTOR) 5 MG tablet TAKE 1 TABLET BY MOUTH EVERY OTHER DAY 45 tablet 3  ? triamterene-hydrochlorothiazide (MAXZIDE-25) 37.5-25 MG tablet TAKE 1 TABLET BY MOUTH EVERY DAY 90 tablet 0  ? vitamin B-12 (CYANOCOBALAMIN) 1000 MCG tablet Take 1,000 mcg by mouth daily.    ? ?No current facility-administered  medications for this visit.  ? ? ? ?Musculoskeletal: ?Strength & Muscle Tone:  UTA ?Gait & Station:  Seated ?Patient leans: N/A ? ?Psychiatric Specialty Exam: ?Review of Systems  ?Psychiatric/Behavioral:  Posit

## 2021-06-25 ENCOUNTER — Other Ambulatory Visit: Payer: Self-pay | Admitting: Family Medicine

## 2021-06-30 ENCOUNTER — Telehealth: Payer: Self-pay | Admitting: *Deleted

## 2021-06-30 NOTE — Telephone Encounter (Signed)
LMTC to schedule Yearly Lung CA CT Scan. 

## 2021-07-02 ENCOUNTER — Other Ambulatory Visit: Payer: Self-pay | Admitting: Family Medicine

## 2021-07-02 ENCOUNTER — Other Ambulatory Visit: Payer: Self-pay

## 2021-07-02 DIAGNOSIS — F17210 Nicotine dependence, cigarettes, uncomplicated: Secondary | ICD-10-CM

## 2021-07-02 DIAGNOSIS — Z122 Encounter for screening for malignant neoplasm of respiratory organs: Secondary | ICD-10-CM

## 2021-07-02 DIAGNOSIS — Z87891 Personal history of nicotine dependence: Secondary | ICD-10-CM

## 2021-07-10 DIAGNOSIS — G8929 Other chronic pain: Secondary | ICD-10-CM | POA: Diagnosis not present

## 2021-07-10 DIAGNOSIS — M545 Low back pain, unspecified: Secondary | ICD-10-CM | POA: Diagnosis not present

## 2021-07-10 DIAGNOSIS — M4316 Spondylolisthesis, lumbar region: Secondary | ICD-10-CM | POA: Diagnosis not present

## 2021-07-16 ENCOUNTER — Ambulatory Visit
Admission: RE | Admit: 2021-07-16 | Discharge: 2021-07-16 | Disposition: A | Discharge status: Home / Self Care | Payer: Medicaid Other | Source: Ambulatory Visit | Attending: Family Medicine | Admitting: Family Medicine

## 2021-07-16 ENCOUNTER — Other Ambulatory Visit: Payer: Self-pay

## 2021-07-16 DIAGNOSIS — F17210 Nicotine dependence, cigarettes, uncomplicated: Secondary | ICD-10-CM

## 2021-07-16 DIAGNOSIS — Z87891 Personal history of nicotine dependence: Secondary | ICD-10-CM | POA: Insufficient documentation

## 2021-07-16 DIAGNOSIS — Z122 Encounter for screening for malignant neoplasm of respiratory organs: Secondary | ICD-10-CM

## 2021-07-16 DIAGNOSIS — Z1231 Encounter for screening mammogram for malignant neoplasm of breast: Secondary | ICD-10-CM | POA: Insufficient documentation

## 2021-07-17 ENCOUNTER — Telehealth (INDEPENDENT_AMBULATORY_CARE_PROVIDER_SITE_OTHER): Payer: Medicaid Other | Admitting: Psychiatry

## 2021-07-17 ENCOUNTER — Encounter: Payer: Self-pay | Admitting: Psychiatry

## 2021-07-17 DIAGNOSIS — F3178 Bipolar disorder, in full remission, most recent episode mixed: Secondary | ICD-10-CM

## 2021-07-17 DIAGNOSIS — F17200 Nicotine dependence, unspecified, uncomplicated: Secondary | ICD-10-CM | POA: Diagnosis not present

## 2021-07-17 DIAGNOSIS — F5105 Insomnia due to other mental disorder: Secondary | ICD-10-CM

## 2021-07-17 DIAGNOSIS — Z8659 Personal history of other mental and behavioral disorders: Secondary | ICD-10-CM

## 2021-07-17 DIAGNOSIS — F411 Generalized anxiety disorder: Secondary | ICD-10-CM

## 2021-07-17 DIAGNOSIS — F1421 Cocaine dependence, in remission: Secondary | ICD-10-CM

## 2021-07-17 MED ORDER — NICOTINE 14 MG/24HR TD PT24: 14.0000 mg | MEDICATED_PATCH | Freq: Every day | TRANSDERMAL | 1 refills | Status: DC

## 2021-07-17 NOTE — Progress Notes (Signed)
Virtual Visit via Video Note ? ?I connected with Alexa Newman on 07/17/21 at  2:00 PM EDT by a video enabled telemedicine application and verified that I am speaking with the correct person using two identifiers. ? ?Location ?Provider Location : [facility] ?Patient Location : Home ? ?Participants: Patient , Provider ?  ?I discussed the limitations of evaluation and management by telemedicine and the availability of in person appointments. The patient expressed understanding and agreed to proceed. ?  ?I discussed the assessment and treatment plan with the patient. The patient was provided an opportunity to ask questions and all were answered. The patient agreed with the plan and demonstrated an understanding of the instructions. ?  ?The patient was advised to call back or seek an in-person evaluation if the symptoms worsen or if the condition fails to improve as anticipated. ? ? ?[place] MD OP Progress Note ? ?07/17/2021 2:30 PM ?Alexa Newman  ?MRN:  [id number] ? ?Chief Complaint:  ?Chief Complaint  ?Patient presents with  ? Follow-up: 61 year old Caucasian female with history of bipolar disorder, GAD, insomnia, borderline personality disorder presented for medication management.  ? ?HPI: Alexa Newman is a 61 year old Caucasian female, divorced on SSI, lives in [place], has a history of bipolar disorder, borderline personality disorder, cocaine use disorder in remission, GAD, insomnia, tobacco use disorder was evaluated by telemedicine today. ? ?Patient today reports she is currently doing well with regards to her mood.  She has not had any mood swings or sadness irritability or crying spells in the past few weeks.  She is currently doing fairly well on the Lamictal. ? ?Patient reports sleep has improved.  She reports she was not able to fill her zolpidem.  She does not remember why.  She however reports she decided not to pick it up since she started sleeping better anyway. ? ?Patient is interested in smoking  cessation.  Patient agreeable to start nicotine replacement therapy.  Provided counseling, receptive. ? ?Denies suicidality, homicidality or perceptual disturbances. ? ?Patient denies any other concerns today. ? ?Visit Diagnosis:  ?  ICD-10-CM   ?1. Bipolar 1 disorder, mixed, full remission ([place])  F31.78   ?  ?2. GAD (generalized anxiety disorder)  F41.1   ?  ?3. Insomnia due to mental condition  F51.05   ? Mood  ?  ?4. Tobacco use disorder  F17.200 nicotine (NICODERM CQ - DOSED IN MG/24 HOURS) 14 mg/24hr patch  ?  ?5. Hx of borderline personality disorder  Z86.59   ?  ?6. Cocaine use disorder, moderate, in sustained remission ([place])  F14.21   ?  ? ? ?Past Psychiatric History: Reviewed past psychiatric history from progress note on 07/27/2017. ? ?Past Medical History:  ?Past Medical History:  ?Diagnosis Date  ? Anxiety   ? Arthritis   ? Bipolar 1 disorder ([place])   ? Depression   ? Family history of adverse reaction to anesthesia   ? half sister claims that she would stop breathing during surgery x 2  ? Hypertension   ? Osteoarthritis   ? PONV (postoperative nausea and vomiting)   ? Pre-diabetes   ?  ?Past Surgical History:  ?Procedure Laterality Date  ? ANTERIOR CRUCIATE LIGAMENT (ACL) REVISION Right   ? CARPAL TUNNEL RELEASE    ? COLONOSCOPY    ? COLONOSCOPY WITH PROPOFOL N/A 11/17/2017  ? Procedure: COLONOSCOPY WITH PROPOFOL;  Surgeon: Virgel Manifold, MD;  Location: [facility] END[facility]COPY;  Service: Endoscopy;  Laterality: N/A;  ? [place]  OF UTERUS  2013  ? for heavy bleeding  ? ENDOMETRIAL ABLATION  2013  ? KNEE ARTHROPLASTY Right 10/02/2020  ? Procedure: COMPUTER ASSISTED TOTAL KNEE ARTHROPLASTY;  Surgeon: Dereck Leep, MD;  Location: [facility] ORS;  Service: Orthopedics;  Laterality: Right;  ? right wrist surgery    ? TUBAL LIGATION    ? WRIST ARTHROSCOPY Left 1990  ? for degenerative changes  ? ? ?Family Psychiatric History: Reviewed family psychiatric history from progress note on 07/27/2017. ? ?Family  History:  ?Family History  ?Problem Relation Age of Onset  ? Diabetes Mother   ? Depression Mother   ? Hypertension Mother   ? Crohn's disease Mother   ? Lung cancer Mother 86  ?     former smoker  ? Congestive Heart Failure Father   ? Breast cancer Sister 68  ? Alcohol abuse Sister   ? Drug abuse Sister   ? Anxiety disorder Sister   ? Depression Sister   ? Bipolar disorder Sister   ? Alcohol abuse Brother   ? Drug abuse Brother   ? Anxiety disorder Brother   ? Depression Brother   ? Colon polyps Brother   ? HIV Brother   ? Alcohol abuse Brother   ? Drug abuse Brother   ? Anxiety disorder Brother   ? Depression Brother   ? Colon polyps Brother   ? HIV Brother   ? Skin cancer Maternal Grandfather   ? Colon cancer Neg Hx   ? Ovarian cancer Neg Hx   ? Cervical cancer Neg Hx   ? ? ?Social History: Reviewed social history from progress note on 07/27/2017. ?Social History  ? ?Socioeconomic History  ? Marital status: Divorced  ?  Spouse name: Not on file  ? Number of children: 1  ? Years of education: Not on file  ? Highest education level: Some college, no degree  ?Occupational History  ? Occupation: disabled  ?  Comment: on SSI awaiting decision about disability  ?Tobacco Use  ? Smoking status: Every Day  ?  Packs/day: 0.50  ?  Years: 45.00  ?  Pack years: 22.50  ?  Types: Cigarettes  ? Smokeless tobacco: Never  ? Tobacco comments:  ?  started smoking at age 43  ?Vaping Use  ? Vaping Use: Former  ?Substance and Sexual Activity  ? Alcohol use: Yes  ?  Comment: 1 times per month  ? Drug use: Not Currently  ?  Comment: former crack cocaine user  ? Sexual activity: Yes  ?  Partners: Male  ?  Birth control/protection: Post-menopausal, Surgical  ?Other Topics Concern  ? Not on file  ?Social History Narrative  ? Live with brother and his husband  ? ?Social Determinants of Health  ? ?Financial Resource Strain: Not on file  ?Food Insecurity: Not on file  ?Transportation Needs: Not on file  ?Physical Activity: Not on file  ?Stress:  Not on file  ?Social Connections: Not on file  ? ? ?Allergies:  ?Allergies  ?Allergen Reactions  ? Pregabalin Other (See Comments)  ?  Other reaction(s): Other (See Comments) ?Suicidal ideation ? ?Suicidal ideation  ? ? ?Metabolic Disorder Labs: ?Lab Results  ?Component Value Date  ? HGBA1C 5.8 (A) 06/02/2021  ? ?No results found for: PROLACTIN ?Lab Results  ?Component Value Date  ? CHOL 154 12/02/2020  ? TRIG 161 (H) 12/02/2020  ? HDL 59 12/02/2020  ? CHOLHDL 2.6 12/02/2020  ? [place] 68 12/02/2020  ? [place] 77  05/27/2020  ? ?Lab Results  ?Component Value Date  ? TSH 0.801 12/02/2020  ? TSH 0.983 11/27/2019  ? ? ?Therapeutic Level Labs: ?No results found for: LITHIUM ?No results found for: VALPROATE ?No components found for:  CBMZ ? ?Current Medications: ?Current Outpatient Medications  ?Medication Sig Dispense Refill  ? methylPREDNISolone (MEDROL DOSEPAK) 4 MG TBPK tablet See admin instructions.    ? nicotine (NICODERM CQ - DOSED IN MG/24 HOURS) 14 mg/24hr patch Place 1 patch (14 mg total) onto the skin daily. 28 patch 1  ? amLODipine (NORVASC) 10 MG tablet TAKE 1 TABLET BY MOUTH EVERY DAY 90 tablet 1  ? KLOR-CON M20 20 MEQ tablet TAKE 1 TABLET BY MOUTH EVERY DAY 90 tablet 1  ? lamoTRIgine (LAMICTAL) 100 MG tablet Take 1 tablet (100 mg total) by mouth 2 (two) times daily. 180 tablet 0  ? meloxicam (MOBIC) 15 MG tablet TAKE 1 TABLET BY MOUTH EVERY DAY 30 tablet 0  ? methocarbamol (ROBAXIN) 500 MG tablet Take 500 mg by mouth 2 (two) times daily.    ? rosuvastatin (CRESTOR) 5 MG tablet TAKE 1 TABLET BY MOUTH EVERY OTHER DAY 45 tablet 3  ? triamterene-hydrochlorothiazide (MAXZIDE-25) 37.5-25 MG tablet TAKE 1 TABLET BY MOUTH EVERY DAY 90 tablet 0  ? vitamin B-12 (CYANOCOBALAMIN) 1000 MCG tablet Take 1,000 mcg by mouth daily.    ? ?No current facility-administered medications for this visit.  ? ? ? ?Musculoskeletal: ?Strength & Muscle Tone:  UTA ?Gait & Station:  Seated ?Patient leans: N/A ? ?Psychiatric Specialty  Exam: ?Review of Systems  ?Psychiatric/Behavioral:  Negative for agitation, behavioral problems, confusion, decreased concentration, dysphoric mood, hallucinations, self-injury, sleep disturbance and suicida

## 2021-07-18 ENCOUNTER — Other Ambulatory Visit: Payer: Self-pay

## 2021-07-18 DIAGNOSIS — Z122 Encounter for screening for malignant neoplasm of respiratory organs: Secondary | ICD-10-CM

## 2021-07-18 DIAGNOSIS — Z87891 Personal history of nicotine dependence: Secondary | ICD-10-CM

## 2021-07-18 DIAGNOSIS — F17210 Nicotine dependence, cigarettes, uncomplicated: Secondary | ICD-10-CM

## 2021-07-25 DIAGNOSIS — M5442 Lumbago with sciatica, left side: Secondary | ICD-10-CM | POA: Diagnosis not present

## 2021-07-25 DIAGNOSIS — M5441 Lumbago with sciatica, right side: Secondary | ICD-10-CM | POA: Diagnosis not present

## 2021-07-28 DIAGNOSIS — M5442 Lumbago with sciatica, left side: Secondary | ICD-10-CM | POA: Diagnosis not present

## 2021-07-28 DIAGNOSIS — M5441 Lumbago with sciatica, right side: Secondary | ICD-10-CM | POA: Diagnosis not present

## 2021-08-07 ENCOUNTER — Other Ambulatory Visit: Payer: Self-pay | Admitting: Family Medicine

## 2021-08-08 NOTE — Telephone Encounter (Signed)
Requested Prescriptions  Pending Prescriptions Disp Refills  . meloxicam (MOBIC) 15 MG tablet [Pharmacy Med Name: MELOXICAM 15 MG TABLET] 30 tablet 2    Sig: TAKE 1 TABLET BY MOUTH EVERY DAY     Analgesics:  COX2 Inhibitors Failed - 08/07/2021  5:43 PM      Failed - Manual Review: Labs are only required if the patient has taken medication for more than 8 weeks.      Passed - HGB in normal range and within 360 days    Hemoglobin  Date Value Ref Range Status  12/02/2020 13.3 11.1 - 15.9 g/dL Final         Passed - Cr in normal range and within 360 days    Creatinine, Ser  Date Value Ref Range Status  06/03/2021 0.82 0.57 - 1.00 mg/dL Final         Passed - HCT in normal range and within 360 days    Hematocrit  Date Value Ref Range Status  12/02/2020 41.8 34.0 - 46.6 % Final         Passed - AST in normal range and within 360 days    AST  Date Value Ref Range Status  01/03/2021 14 0 - 40 IU/L Final         Passed - ALT in normal range and within 360 days    ALT  Date Value Ref Range Status  01/03/2021 13 0 - 32 IU/L Final         Passed - eGFR is 30 or above and within 360 days    GFR calc Af Amer  Date Value Ref Range Status  11/27/2019 103 >59 mL/min/1.73 Final    Comment:    **[facility] currently reports eGFR in compliance with the current**   recommendations of the [employer]. [facility] will   update reporting as new guidelines are published from the NKF-ASN   Task force.    GFR, Estimated  Date Value Ref Range Status  09/24/2020 >60 >60 mL/min Final    Comment:    (NOTE) Calculated using the CKD-EPI Creatinine Equation (2021)    eGFR  Date Value Ref Range Status  06/03/2021 82 >59 mL/min/1.73 Final         Passed - Patient is not pregnant      Passed - Valid encounter within last 12 months    Recent Outpatient Visits          2 months ago Essential hypertension   [place], Dionne Bucy, MD   8 months ago  Encounter for annual physical exam   [facility], Dionne Bucy, MD   1 year ago Left hip pain   [place], Vickki Muff, PA-C   1 year ago Essential hypertension   [place], Dionne Bucy, MD   1 year ago Myalgia due to statin   [facility], Dionne Bucy, MD      Future Appointments            In 4 months [place], Dionne Bucy, MD [facility], [place]

## 2021-08-13 DIAGNOSIS — M5441 Lumbago with sciatica, right side: Secondary | ICD-10-CM | POA: Diagnosis not present

## 2021-08-13 DIAGNOSIS — M5442 Lumbago with sciatica, left side: Secondary | ICD-10-CM | POA: Diagnosis not present

## 2021-08-20 DIAGNOSIS — M5441 Lumbago with sciatica, right side: Secondary | ICD-10-CM | POA: Diagnosis not present

## 2021-08-20 DIAGNOSIS — M5442 Lumbago with sciatica, left side: Secondary | ICD-10-CM | POA: Diagnosis not present

## 2021-09-01 ENCOUNTER — Other Ambulatory Visit: Payer: Self-pay | Admitting: Orthopedic Surgery

## 2021-09-01 DIAGNOSIS — G8929 Other chronic pain: Secondary | ICD-10-CM

## 2021-09-01 DIAGNOSIS — M4316 Spondylolisthesis, lumbar region: Secondary | ICD-10-CM

## 2021-09-10 ENCOUNTER — Ambulatory Visit
Admission: RE | Admit: 2021-09-10 | Discharge: 2021-09-10 | Disposition: A | Discharge status: Home / Self Care | Payer: Medicaid Other | Source: Ambulatory Visit | Attending: Orthopedic Surgery | Admitting: Orthopedic Surgery

## 2021-09-10 DIAGNOSIS — M4316 Spondylolisthesis, lumbar region: Secondary | ICD-10-CM | POA: Insufficient documentation

## 2021-09-10 DIAGNOSIS — M545 Low back pain, unspecified: Secondary | ICD-10-CM | POA: Insufficient documentation

## 2021-09-10 DIAGNOSIS — G8929 Other chronic pain: Secondary | ICD-10-CM | POA: Insufficient documentation

## 2021-10-02 DIAGNOSIS — Z96651 Presence of right artificial knee joint: Secondary | ICD-10-CM | POA: Diagnosis not present

## 2021-10-03 DIAGNOSIS — M48062 Spinal stenosis, lumbar region with neurogenic claudication: Secondary | ICD-10-CM | POA: Diagnosis not present

## 2021-10-16 ENCOUNTER — Telehealth (INDEPENDENT_AMBULATORY_CARE_PROVIDER_SITE_OTHER): Payer: Medicaid Other | Admitting: Psychiatry

## 2021-10-16 ENCOUNTER — Encounter: Payer: Self-pay | Admitting: Psychiatry

## 2021-10-16 DIAGNOSIS — Z634 Disappearance and death of family member: Secondary | ICD-10-CM | POA: Diagnosis not present

## 2021-10-16 DIAGNOSIS — F411 Generalized anxiety disorder: Secondary | ICD-10-CM | POA: Diagnosis not present

## 2021-10-16 DIAGNOSIS — F3178 Bipolar disorder, in full remission, most recent episode mixed: Secondary | ICD-10-CM | POA: Diagnosis not present

## 2021-10-16 DIAGNOSIS — F5105 Insomnia due to other mental disorder: Secondary | ICD-10-CM | POA: Diagnosis not present

## 2021-10-16 DIAGNOSIS — Z8659 Personal history of other mental and behavioral disorders: Secondary | ICD-10-CM

## 2021-10-16 DIAGNOSIS — F1421 Cocaine dependence, in remission: Secondary | ICD-10-CM

## 2021-10-16 DIAGNOSIS — F17200 Nicotine dependence, unspecified, uncomplicated: Secondary | ICD-10-CM

## 2021-10-16 MED ORDER — LAMOTRIGINE 100 MG PO TABS: 100.0000 mg | ORAL_TABLET | Freq: Two times a day (BID) | ORAL | 0 refills | Status: DC

## 2021-10-16 MED ORDER — DOXEPIN HCL 10 MG PO CAPS: 10.0000 mg | ORAL_CAPSULE | Freq: Every evening | ORAL | 1 refills | Status: DC | PRN

## 2021-10-16 MED ORDER — HYDROXYZINE HCL 10 MG PO TABS: 10.0000 mg | ORAL_TABLET | Freq: Three times a day (TID) | ORAL | 1 refills | Status: DC | PRN

## 2021-10-16 NOTE — Progress Notes (Signed)
Virtual Visit via Video Note  I connected with Alexa Newman on 10/16/21 at 11:30 AM EDT by a video enabled telemedicine application and verified that I am speaking with the correct person using two identifiers.  Location Provider Location : [facility] Patient Location : Home  Participants: Patient , Provider    I discussed the limitations of evaluation and management by telemedicine and the availability of in person appointments. The patient expressed understanding and agreed to proceed.    I discussed the assessment and treatment plan with the patient. The patient was provided an opportunity to ask questions and all were answered. The patient agreed with the plan and demonstrated an understanding of the instructions.   The patient was advised to call back or seek an in-person evaluation if the symptoms worsen or if the condition fails to improve as anticipated.   [place] MD OP Progress Note  10/16/2021 3:31 PM Alexa Newman  MRN:  [id number]  Chief Complaint:  Chief Complaint  Patient presents with   Follow-up: 61 year old Caucasian female with history of bipolar disorder, GAD, presented for medication management with worsening anxiety, sleep problems.   HPI: Alexa Newman is a 61 year old Caucasian female, divorced, on SSI, lives in [place], has a history of bipolar disorder, borderline personality disorder, cocaine use disorder in remission, GAD, insomnia, tobacco use disorder was evaluated by telemedicine today.  Patient today reports she is currently struggling with worsening anxiety symptoms.  She is often anxious, restless, has trouble relaxing.  Patient reports this started with a phobia of 'water bugs' which recently invaded her house.  Patient reports her brother did call the exterminator and it has gotten better.  Patient however reports since then she has been extremely anxious, on edge and goes into anxiety attacks if she notices bugs anywhere around her.  Patient also  reports sleep problems.  Reports she has difficulty falling asleep and maintaining sleep.  Currently not on sleep medications.  Reports psychosocial stressors of loss of her sister who passed away on 2021/08/17.  Patient reports it was expected however she is currently grieving.  She does have good support system from her family.  Patient however is interested in psychotherapy.  Patient denies any suicidality, homicidality or perceptual disturbances.  Currently trying to cut back on smoking.  Does have nicotine patches available.  Patient denies any other concerns today.  Visit Diagnosis:    ICD-10-CM   1. Bipolar 1 disorder, mixed, full remission ([place])  F31.78     2. GAD (generalized anxiety disorder)  F41.1 doxepin (SINEQUAN) 10 MG capsule    hydrOXYzine (ATARAX) 10 MG tablet    lamoTRIgine (LAMICTAL) 100 MG tablet    3. Insomnia due to mental condition  F51.05 doxepin (SINEQUAN) 10 MG capsule   mood    4. Bereavement  Z63.4     5. Tobacco use disorder  F17.200     6. Hx of borderline personality disorder  Z86.59     7. Cocaine use disorder, moderate, in sustained remission ([place])  F14.21       Past Psychiatric History: Reviewed past psychiatric history from progress note on Aug 17, 2017.  Past Medical History:  Past Medical History:  Diagnosis Date   Anxiety    Arthritis    Bipolar 1 disorder ([place])    Depression    Family history of adverse reaction to anesthesia    half sister claims that she would stop breathing during surgery x 2   Hypertension  Osteoarthritis    PONV (postoperative nausea and vomiting)    Pre-diabetes     Past Surgical History:  Procedure Laterality Date   ANTERIOR CRUCIATE LIGAMENT (ACL) REVISION Right    CARPAL TUNNEL RELEASE     COLONOSCOPY     COLONOSCOPY WITH PROPOFOL N/A 11/17/2017   Procedure: COLONOSCOPY WITH PROPOFOL;  Surgeon: Virgel Manifold, MD;  Location: [facility] ENDOSCOPY;  Service: Endoscopy;  Laterality: N/A;   DILATION  AND CURETTAGE OF UTERUS  2013   for heavy bleeding   ENDOMETRIAL ABLATION  2013   KNEE ARTHROPLASTY Right 10/02/2020   Procedure: COMPUTER ASSISTED TOTAL KNEE ARTHROPLASTY;  Surgeon: Dereck Leep, MD;  Location: [facility] ORS;  Service: Orthopedics;  Laterality: Right;   right wrist surgery     TUBAL LIGATION     WRIST ARTHROSCOPY Left 1990   for degenerative changes    Family Psychiatric History: Reviewed family psychiatric history from progress note on 07/27/2017.  Family History:  Family History  Problem Relation Age of Onset   Diabetes Mother    Depression Mother    Hypertension Mother    Crohn's disease Mother    Lung cancer Mother 63       former smoker   Congestive Heart Failure Father    Breast cancer Sister 22   Alcohol abuse Sister    Drug abuse Sister    Anxiety disorder Sister    Depression Sister    Bipolar disorder Sister    Alcohol abuse Brother    Drug abuse Brother    Anxiety disorder Brother    Depression Brother    Colon polyps Brother    HIV Brother    Alcohol abuse Brother    Drug abuse Brother    Anxiety disorder Brother    Depression Brother    Colon polyps Brother    HIV Brother    Skin cancer Maternal Grandfather    Colon cancer Neg Hx    Ovarian cancer Neg Hx    Cervical cancer Neg Hx     Social History: Reviewed social history from progress note on 07/27/2017. Social History   Socioeconomic History   Marital status: Divorced    Spouse name: Not on file   Number of children: 1   Years of education: Not on file   Highest education level: Some college, no degree  Occupational History   Occupation: disabled    Comment: on SSI awaiting decision about disability  Tobacco Use   Smoking status: Every Day    Packs/day: 0.50    Years: 45.00    Total pack years: 22.50    Types: Cigarettes   Smokeless tobacco: Never   Tobacco comments:    started smoking at age 47  Vaping Use   Vaping Use: Former  Substance and Sexual Activity   Alcohol  use: Yes    Comment: 1 times per month   Drug use: Not Currently    Comment: former crack cocaine user   Sexual activity: Yes    Partners: Male    Birth control/protection: Post-menopausal, Surgical  Other Topics Concern   Not on file  Social History Narrative   Live with brother and his husband   Social Determinants of Health   Financial Resource Strain: High Risk (07/27/2017)   Overall Financial Resource Strain (CARDIA)    Difficulty of Paying Living Expenses: Hard  Food Insecurity: Food Insecurity Present (07/27/2017)   Hunger Vital Sign    Worried About Charity fundraiser in  the Last Year: Often true    [place] in the Last Year: Often true  Transportation Needs: Unmet Transportation Needs (07/27/2017)   PRAPARE - Hydrologist (Medical): Yes    Lack of Transportation (Non-Medical): Yes  Physical Activity: Insufficiently Active (07/27/2017)   Exercise Vital Sign    Days of Exercise per Week: 4 days    Minutes of Exercise per Session: 20 min  Stress: Stress Concern Present (07/27/2017)   [place]    Feeling of Stress : Very much  Social Connections: Somewhat Isolated (07/27/2017)   Social Connection and Isolation Panel [NHANES]    Frequency of Communication with Friends and Family: More than three times a week    Frequency of Social Gatherings with Friends and Family: Once a week    Attends Religious Services: Never    Marine scientist or Organizations: No    Attends Archivist Meetings: Never    Marital Status: Married    Allergies:  Allergies  Allergen Reactions   Pregabalin Other (See Comments)    Other reaction(s): Other (See Comments) Suicidal ideation  Suicidal ideation    Metabolic Disorder Labs: Lab Results  Component Value Date   HGBA1C 5.8 (A) 06/02/2021   No results found for: "PROLACTIN" Lab Results  Component Value Date   CHOL 154  12/02/2020   TRIG 161 (H) 12/02/2020   HDL 59 12/02/2020   CHOLHDL 2.6 12/02/2020   LDLCALC 68 12/02/2020   LDLCALC 77 05/27/2020   Lab Results  Component Value Date   TSH 0.801 12/02/2020   TSH 0.983 11/27/2019    Therapeutic Level Labs: No results found for: "LITHIUM" No results found for: "VALPROATE" No results found for: "CBMZ"  Current Medications: Current Outpatient Medications  Medication Sig Dispense Refill   amLODipine (NORVASC) 10 MG tablet Take 1 tablet by mouth daily.     doxepin (SINEQUAN) 10 MG capsule Take 1 capsule (10 mg total) by mouth at bedtime as needed. For sleep 30 capsule 1   hydrOXYzine (ATARAX) 10 MG tablet Take 1 tablet (10 mg total) by mouth 3 (three) times daily as needed for anxiety. 90 tablet 1   meloxicam (MOBIC) 15 MG tablet TAKE 1 TABLET BY MOUTH EVERY DAY 30 tablet 2   methocarbamol (ROBAXIN) 500 MG tablet Take 500 mg by mouth 2 (two) times daily.     nicotine (NICODERM CQ - DOSED IN MG/24 HOURS) 14 mg/24hr patch Place 1 patch (14 mg total) onto the skin daily. 28 patch 1   rosuvastatin (CRESTOR) 5 MG tablet TAKE 1 TABLET BY MOUTH EVERY OTHER DAY 45 tablet 3   triamterene-hydrochlorothiazide (MAXZIDE-25) 37.5-25 MG tablet TAKE 1 TABLET BY MOUTH EVERY DAY 90 tablet 0   vitamin B-12 (CYANOCOBALAMIN) 1000 MCG tablet Take 1,000 mcg by mouth daily.     KLOR-CON M20 20 MEQ tablet TAKE 1 TABLET BY MOUTH EVERY DAY (Patient not taking: Reported on 10/16/2021) 90 tablet 1   lamoTRIgine (LAMICTAL) 100 MG tablet Take 1 tablet (100 mg total) by mouth 2 (two) times daily. 180 tablet 0   methylPREDNISolone (MEDROL DOSEPAK) 4 MG TBPK tablet See admin instructions. (Patient not taking: Reported on 10/16/2021)     No current facility-administered medications for this visit.     Musculoskeletal: Strength & Muscle Tone:  UTA Gait & Station:  Seated Patient leans: N/A  Psychiatric Specialty Exam: Review of Systems  Psychiatric/Behavioral:  Positive for sleep  disturbance. The patient is nervous/anxious.   All other systems reviewed and are negative.   Last menstrual period 12/07/2011.There is no height or weight on file to calculate BMI.  General Appearance: Casual  Eye Contact:  Fair  Speech:  Clear and Coherent  Volume:  Normal  Mood:  Anxious, grieving  Affect:  Congruent  Thought Process:  Goal Directed and Descriptions of Associations: Intact  Orientation:  Full (Time, Place, and Person)  Thought Content: Logical   Suicidal Thoughts:  No  Homicidal Thoughts:  No  Memory:  Immediate;   Fair Recent;   Fair Remote;   Fair  Judgement:  Fair  Insight:  Fair  Psychomotor Activity:  Normal  Concentration:  Concentration: Fair and Attention Span: Fair  Recall:  [employer] of Knowledge: Fair  Language: Fair  Akathisia:  No  Handed:  Right  AIMS (if indicated): done  Assets:  Communication Skills Desire for Improvement Housing Social Support Talents/Skills  ADL's:  Intact  Cognition: WNL  Sleep:  Poor   Screenings: AIMS    Flowsheet Row Video Visit from 10/16/2021 in [place] Video Visit from 07/17/2021 in [place] Total Score 0 0      GAD-7    Flowsheet Row Video Visit from 10/16/2021 in [place] Video Visit from 07/17/2021 in [place] Video Visit from 06/19/2021 in [place] Office Visit from 01/14/2021 in [place] Video Visit from 01/22/2020 in [place]  Total GAD-7 Score '13 2 13 1 15      '$ PHQ2-9    Flowsheet Row Video Visit from 10/16/2021 in [place] Video Visit from 07/17/2021 in [place] Video Visit from 06/19/2021 in [place] Video Visit from 04/16/2021 in [place] Office  Visit from 01/14/2021 in [facility]  PHQ-2 Total Score 0 0 2 0 0  PHQ-9 Total Score -- -- 5 -- --      Flowsheet Row Video Visit from 10/16/2021 in [place] Video Visit from 06/19/2021 in [place] Video Visit from 04/16/2021 in [facility] Low Risk Low Risk Low Risk        Assessment and Plan: Alexa Newman is a 61 year old Caucasian female, divorced on SSI, lives in [place], has a history of bipolar disorder, borderline personality disorder, insomnia was evaluated by telemedicine today.  Patient is currently struggling with anxiety, sleep problems, grief, will benefit from the following plan.  Plan Bipolar disorder in remission Lamotrigine 100 mg p.o. twice daily  GAD-unstable Start hydroxyzine 10 mg twice a day as needed for severe anxiety Referral for CBT-provided resources in the community  Insomnia-unstable Start doxepin 10 mg p.o. nightly as needed Discussed sleep hygiene techniques  Bereavement-unstable Referred for CBT  Follow-up in clinic in 4 weeks or sooner if needed.   Collaboration of Care: Collaboration of Care: Referral or follow-up with counselor/therapist AEB encouraged to establish care with therapist.  Patient/Guardian was advised Release of Information must be obtained prior to any record release in order to collaborate their care with an outside provider. Patient/Guardian was advised if they have not already done so to contact the registration department to sign all necessary forms in order for [place] to release information regarding their care.   Consent: Patient/Guardian gives verbal consent for treatment  and assignment of benefits for services provided during this visit. Patient/Guardian expressed understanding and agreed to proceed.   This note was generated in part or whole with voice recognition software. Voice  recognition is usually quite accurate but there are transcription errors that can and very often do occur. I apologize for any typographical errors that were not detected and corrected.      Ursula Alert, MD 10/16/2021, 3:31 PM

## 2021-10-16 NOTE — Patient Instructions (Addendum)
[email or web address]  [email or web address]   [facility] [phone]   [place] [phone]  [address] [facility] [phone]   Doxepin Capsules What is this medication? DOXEPIN (DOX e pin) treats depression and anxiety. It increases the amount of serotonin and norepinephrine in the brain, hormones that help regulate mood. It belongs to a group of medications called tricyclic antidepressants (TCAs). This medicine may be used for other purposes; ask your health care provider or pharmacist if you have questions. COMMON BRAND NAME(S): Sinequan What should I tell my care team before I take this medication? They need to know if you have any of these conditions: Bipolar disorder Difficulty passing urine Glaucoma Heart disease If you frequently drink alcohol containing drinks Liver disease Lung or breathing disease, like asthma or sleep apnea Prostate trouble Schizophrenia Seizures Suicidal thoughts, plans, or attempt; a previous suicide attempt by you or a family member An unusual or allergic reaction to doxepin, other medications, foods, dyes, or preservatives Pregnant or trying to get pregnant Breast-feeding How should I use this medication? Take this medication by mouth with a glass of water. Follow the directions on the prescription label. Take your doses at regular intervals. Do not take your medication more often than directed. Do not stop taking this medication suddenly except upon the advice of your care team. Stopping this medication too quickly may cause serious side effects or your condition may worsen. A special MedGuide will be given to you by the pharmacist with each prescription and refill. Be sure to read this information carefully each time. Talk to your care team about the use of this medication in children. While this medication may be prescribed for children as young as 12 years for selected conditions, precautions do  apply. Overdosage: If you think you have taken too much of this medicine contact a poison control center or emergency room at once. NOTE: This medicine is only for you. Do not share this medicine with others. What if I miss a dose? If you miss a dose, take it as soon as you can. If it is almost time for your next dose, take only that dose. Do not take double or extra doses. What may interact with this medication? Do not take this medication with any of the following: Arsenic trioxide Certain medications used to regulate abnormal heartbeat or to treat other heart conditions Cisapride Halofantrine Levomethadyl Linezolid MAOIs like Carbex, Eldepryl, Marplan, Nardil, and Parnate Methylene blue Other medications for mental depression Phenothiazines like perphenazine, thioridazine and chlorpromazine Pimozide Procarbazine Sparfloxacin St. John's Wort This medication may also interact with the following: Cimetidine Tolazamide Ziprasidone This list may not describe all possible interactions. Give your health care provider a list of all the medicines, herbs, non-prescription drugs, or dietary supplements you use. Also tell them if you smoke, drink alcohol, or use illegal drugs. Some items may interact with your medicine. What should I watch for while using this medication? Visit your care team for regular checks on your progress. It can take several days before you feel the full effect of this medication. If you have been taking this medication regularly for some time, do not suddenly stop taking it. You must gradually reduce the dose or you may get severe side effects. Ask your care team for advice. Even after you stop taking this medication it can still affect your body for several days. Patients and their families should watch out for new or worsening thoughts of  suicide or depression. Also watch out for sudden changes in feelings such as feeling anxious, agitated, panicky, irritable, hostile,  aggressive, impulsive, severely restless, overly excited and hyperactive, or not being able to sleep. If this happens, especially at the beginning of treatment or after a change in dose, call your care team. You may get drowsy or dizzy. Do not drive, use machinery, or do anything that needs mental alertness until you know how this medication affects you. Do not stand or sit up quickly, especially if you are an older patient. This reduces the risk of dizzy or fainting spells. Alcohol may increase dizziness and drowsiness. Avoid alcoholic drinks. Do not treat yourself for coughs, colds, or allergies without asking your care team for advice. Some ingredients can increase possible side effects. Your mouth may get dry. Chewing sugarless gum or sucking hard candy, and drinking plenty of water may help. Contact your care team if the problem does not go away or is severe. This medication may cause dry eyes and blurred vision. If you wear contact lenses you may feel some discomfort. Lubricating drops may help. See your care team if the problem does not go away or is severe. This medication can make you more sensitive to the sun. Keep out of the sun. If you cannot avoid being in the sun, wear protective clothing and use sunscreen. Do not use sun lamps or tanning beds/booths. What side effects may I notice from receiving this medication? Side effects that you should report to your care team as soon as possible: Allergic reactions--skin rash, itching, hives, swelling of the face, lips, tongue, or throat Irritability, confusion, fast or irregular heartbeat, muscle stiffness, twitching muscles, sweating, high fever, seizure, chills, vomiting, diarrhea, which may be signs of serotonin syndrome Sudden eye pain or change in vision such as blurry vision, seeing halos around lights, vision loss Thoughts of suicide or self-harm, worsening mood, or feelings of depression Trouble passing urine Side effects that usually do  not require medical attention (report to your care team if they continue or are bothersome): Change in sex drive or performance Constipation Dizziness Drowsiness Dry mouth Tremors Weight gain This list may not describe all possible side effects. Call your doctor for medical advice about side effects. You may report side effects to FDA at 1-800-FDA-1088. Where should I keep my medication? Keep out of the reach of children. Store at room temperature between 15 and 30 degrees C (59 and 86 degrees F). Throw away any unused medication after the expiration date. NOTE: This sheet is a summary. It may not cover all possible information. If you have questions about this medicine, talk to your doctor, pharmacist, or health care provider.  2023 Elsevier/Gold Standard (2020-05-08 00:00:00) Hydroxyzine Capsules or Tablets What is this medication? HYDROXYZINE (hye [place] i zeen) treats the symptoms of allergies and allergic reactions. It may also be used to treat anxiety or cause drowsiness before a procedure. It works by blocking histamine, a substance released by the body during an allergic reaction. It belongs to a group of medications called antihistamines. This medicine may be used for other purposes; ask your health care provider or pharmacist if you have questions. COMMON BRAND NAME(S): ANX, Atarax, Rezine, Vistaril What should I tell my care team before I take this medication? They need to know if you have any of these conditions: Glaucoma Heart disease History of irregular heartbeat Kidney disease Liver disease Lung or breathing disease, like asthma Stomach or intestine problems Thyroid disease Trouble passing  urine An unusual or allergic reaction to hydroxyzine, cetirizine, other medications, foods, dyes or preservatives Pregnant or trying to get pregnant Breast-feeding How should I use this medication? Take this medication by mouth with a full glass of water. Follow the directions on  the prescription label. You may take this medication with food or on an empty stomach. Take your medication at regular intervals. Do not take your medication more often than directed. Talk to your care team regarding the use of this medication in children. Special care may be needed. While this medication may be prescribed for children as young as 87 years of age for selected conditions, precautions do apply. Patients over 4 years old may have a stronger reaction and need a smaller dose. Overdosage: If you think you have taken too much of this medicine contact a poison control center or emergency room at once. NOTE: This medicine is only for you. Do not share this medicine with others. What if I miss a dose? If you miss a dose, take it as soon as you can. If it is almost time for your next dose, take only that dose. Do not take double or extra doses. What may interact with this medication? Do not take this medication with any of the following: Cisapride Dronedarone Pimozide Thioridazine This medication may also interact with the following: Alcohol Antihistamines for allergy, cough, and cold Atropine Barbiturate medications for sleep or seizures, like phenobarbital Certain antibiotics like erythromycin or clarithromycin Certain medications for anxiety or sleep Certain medications for bladder problems like oxybutynin, tolterodine Certain medications for depression or psychotic disturbances Certain medications for irregular heart beat Certain medications for Parkinson's disease like benztropine, trihexyphenidyl Certain medications for seizures like phenobarbital, primidone Certain medications for stomach problems like dicyclomine, hyoscyamine Certain medications for travel sickness like scopolamine Ipratropium Narcotic medications for pain Other medications that prolong the QT interval (which can cause an abnormal heart rhythm) like dofetilide This list may not describe all possible  interactions. Give your health care provider a list of all the medicines, herbs, non-prescription drugs, or dietary supplements you use. Also tell them if you smoke, drink alcohol, or use illegal drugs. Some items may interact with your medicine. What should I watch for while using this medication? Tell your care team if your symptoms do not improve. You may get drowsy or dizzy. Do not drive, use machinery, or do anything that needs mental alertness until you know how this medication affects you. Do not stand or sit up quickly, especially if you are an older patient. This reduces the risk of dizzy or fainting spells. Alcohol may interfere with the effect of this medication. Avoid alcoholic drinks. Your mouth may get dry. Chewing sugarless gum or sucking hard candy, and drinking plenty of water may help. Contact your care team if the problem does not go away or is severe. This medication may cause dry eyes and blurred vision. If you wear contact lenses you may feel some discomfort. Lubricating drops may help. See your eye care specialist if the problem does not go away or is severe. If you are receiving skin tests for allergies, tell your care team you are using this medication. What side effects may I notice from receiving this medication? Side effects that you should report to your care team as soon as possible: Allergic reactions--skin rash, itching, hives, swelling of the face, lips, tongue, or throat Heart rhythm changes--fast or irregular heartbeat, dizziness, feeling faint or lightheaded, chest pain, trouble breathing Side effects that  usually do not require medical attention (report to your care team if they continue or are bothersome): Confusion Drowsiness Dry mouth Hallucinations Headache This list may not describe all possible side effects. Call your doctor for medical advice about side effects. You may report side effects to FDA at 1-800-FDA-1088. Where should I keep my medication? Keep  out of the reach of children and pets. Store at room temperature between 15 and 30 degrees C (59 and 86 degrees F). Keep container tightly closed. Throw away any unused medication after the expiration date. NOTE: This sheet is a summary. It may not cover all possible information. If you have questions about this medicine, talk to your doctor, pharmacist, or health care provider.  2023 Elsevier/Gold Standard (2020-03-28 00:00:00)

## 2021-10-21 DIAGNOSIS — G8929 Other chronic pain: Secondary | ICD-10-CM | POA: Diagnosis not present

## 2021-10-21 DIAGNOSIS — M48062 Spinal stenosis, lumbar region with neurogenic claudication: Secondary | ICD-10-CM | POA: Diagnosis not present

## 2021-10-21 DIAGNOSIS — M5441 Lumbago with sciatica, right side: Secondary | ICD-10-CM | POA: Diagnosis not present

## 2021-10-21 DIAGNOSIS — M5442 Lumbago with sciatica, left side: Secondary | ICD-10-CM | POA: Diagnosis not present

## 2021-10-30 DIAGNOSIS — M48062 Spinal stenosis, lumbar region with neurogenic claudication: Secondary | ICD-10-CM | POA: Diagnosis not present

## 2021-10-30 DIAGNOSIS — M5442 Lumbago with sciatica, left side: Secondary | ICD-10-CM | POA: Diagnosis not present

## 2021-10-30 DIAGNOSIS — M5441 Lumbago with sciatica, right side: Secondary | ICD-10-CM | POA: Diagnosis not present

## 2021-11-03 ENCOUNTER — Other Ambulatory Visit: Payer: Self-pay | Admitting: Family Medicine

## 2021-11-08 ENCOUNTER — Other Ambulatory Visit: Payer: Self-pay | Admitting: Psychiatry

## 2021-11-08 DIAGNOSIS — F5105 Insomnia due to other mental disorder: Secondary | ICD-10-CM

## 2021-11-08 DIAGNOSIS — F411 Generalized anxiety disorder: Secondary | ICD-10-CM

## 2021-11-11 DIAGNOSIS — G8929 Other chronic pain: Secondary | ICD-10-CM | POA: Diagnosis not present

## 2021-11-11 DIAGNOSIS — M5442 Lumbago with sciatica, left side: Secondary | ICD-10-CM | POA: Diagnosis not present

## 2021-11-11 DIAGNOSIS — M5441 Lumbago with sciatica, right side: Secondary | ICD-10-CM | POA: Diagnosis not present

## 2021-11-11 DIAGNOSIS — M48062 Spinal stenosis, lumbar region with neurogenic claudication: Secondary | ICD-10-CM | POA: Diagnosis not present

## 2021-11-24 ENCOUNTER — Telehealth (INDEPENDENT_AMBULATORY_CARE_PROVIDER_SITE_OTHER): Payer: Medicaid Other | Admitting: Psychiatry

## 2021-11-24 ENCOUNTER — Encounter: Payer: Self-pay | Admitting: Psychiatry

## 2021-11-24 DIAGNOSIS — F5105 Insomnia due to other mental disorder: Secondary | ICD-10-CM

## 2021-11-24 DIAGNOSIS — Z8659 Personal history of other mental and behavioral disorders: Secondary | ICD-10-CM

## 2021-11-24 DIAGNOSIS — F3178 Bipolar disorder, in full remission, most recent episode mixed: Secondary | ICD-10-CM | POA: Diagnosis not present

## 2021-11-24 DIAGNOSIS — F411 Generalized anxiety disorder: Secondary | ICD-10-CM

## 2021-11-24 DIAGNOSIS — F17200 Nicotine dependence, unspecified, uncomplicated: Secondary | ICD-10-CM

## 2021-11-24 DIAGNOSIS — F1421 Cocaine dependence, in remission: Secondary | ICD-10-CM

## 2021-11-24 DIAGNOSIS — Z634 Disappearance and death of family member: Secondary | ICD-10-CM

## 2021-11-24 NOTE — Progress Notes (Unsigned)
Virtual Visit via Video Note  I connected with Alexa Newman on 11/24/21 at  2:40 PM EDT by a video enabled telemedicine application and verified that I am speaking with the correct person using two identifiers.  Location Provider Location : [facility] Patient Location : Home  Participants: Patient , Provider  I discussed the limitations of evaluation and management by telemedicine and the availability of in person appointments. The patient expressed understanding and agreed to proceed.   I discussed the assessment and treatment plan with the patient. The patient was provided an opportunity to ask questions and all were answered. The patient agreed with the plan and demonstrated an understanding of the instructions.   The patient was advised to call back or seek an in-person evaluation if the symptoms worsen or if the condition fails to improve as anticipated.   [place] MD OP Progress Note  11/24/2021 2:55 PM Alexa Newman  MRN:  [id number]  Chief Complaint:  Chief Complaint  Patient presents with   Follow-up: 61 year old Caucasian female with history of bipolar disorder, anxiety, presented for medication management.   HPI: Alexa Newman is a 61 year old Caucasian female, divorced, on SSI, lives in [place], has a history of bipolar disorder, borderline personality disorder, cocaine use disorder in remission, GAD, insomnia, tobacco use disorder was evaluated by telemedicine today.  Patient today reports she is currently doing well with regards to her mood.  Recently there has been several deaths in the family.  Her brother-in-law died last week, her son-in-law's father died a few days ago.  Patient reports she has been trying to help and support the family especially her son-in-law's mother.  Patient reports that has helped her to feel better since she feels her anxiety and depression symptoms are currently under control.  She is getting out more and is around people.  Reports sleep is  overall okay.  Currently taking doxepin which helps.  Denies any significant sadness, anxiety symptoms.  Denies any mood swings.  Currently compliant on the Lamictal.  Denies side effects.  Patient currently denies any suicidality, homicidality or perceptual disturbances.  Patient denies any other concerns today.  Visit Diagnosis:    ICD-10-CM   1. Bipolar 1 disorder, mixed, full remission ([place])  F31.78     2. GAD (generalized anxiety disorder)  F41.1     3. Insomnia due to mental condition  F51.05    mood    4. Bereavement  Z63.4     5. Tobacco use disorder  F17.200     6. Hx of borderline personality disorder  Z86.59     7. Cocaine use disorder, moderate, in sustained remission ([place])  F14.21       Past Psychiatric History: Reviewed past psychiatric history from progress note on 07/27/2017.  Past Medical History:  Past Medical History:  Diagnosis Date   Anxiety    Arthritis    Bipolar 1 disorder ([place])    Depression    Family history of adverse reaction to anesthesia    half sister claims that she would stop breathing during surgery x 2   Hypertension    Osteoarthritis    PONV (postoperative nausea and vomiting)    Pre-diabetes     Past Surgical History:  Procedure Laterality Date   ANTERIOR CRUCIATE LIGAMENT (ACL) REVISION Right    CARPAL TUNNEL RELEASE     COLONOSCOPY     COLONOSCOPY WITH PROPOFOL N/A 11/17/2017   Procedure: COLONOSCOPY WITH PROPOFOL;  Surgeon: Virgel Manifold, MD;  Location: [facility] ENDOSCOPY;  Service: Endoscopy;  Laterality: N/A;   DILATION AND CURETTAGE OF UTERUS  2013   for heavy bleeding   ENDOMETRIAL ABLATION  2013   KNEE ARTHROPLASTY Right 10/02/2020   Procedure: COMPUTER ASSISTED TOTAL KNEE ARTHROPLASTY;  Surgeon: Dereck Leep, MD;  Location: [facility] ORS;  Service: Orthopedics;  Laterality: Right;   right wrist surgery     TUBAL LIGATION     WRIST ARTHROSCOPY Left 1990   for degenerative changes    Family Psychiatric History:  Reviewed family psychiatric history from progress note on 07/27/2017.  Family History:  Family History  Problem Relation Age of Onset   Diabetes Mother    Depression Mother    Hypertension Mother    Crohn's disease Mother    Lung cancer Mother 4       former smoker   Congestive Heart Failure Father    Breast cancer Sister 68   Alcohol abuse Sister    Drug abuse Sister    Anxiety disorder Sister    Depression Sister    Bipolar disorder Sister    Alcohol abuse Brother    Drug abuse Brother    Anxiety disorder Brother    Depression Brother    Colon polyps Brother    HIV Brother    Alcohol abuse Brother    Drug abuse Brother    Anxiety disorder Brother    Depression Brother    Colon polyps Brother    HIV Brother    Skin cancer Maternal Grandfather    Colon cancer Neg Hx    Ovarian cancer Neg Hx    Cervical cancer Neg Hx     Social History: Reviewed social history from progress note on 07/27/2017. Social History   Socioeconomic History   Marital status: Divorced    Spouse name: Not on file   Number of children: 1   Years of education: Not on file   Highest education level: Some college, no degree  Occupational History   Occupation: disabled    Comment: on SSI awaiting decision about disability  Tobacco Use   Smoking status: Every Day    Packs/day: 0.50    Years: 45.00    Total pack years: 22.50    Types: Cigarettes   Smokeless tobacco: Never   Tobacco comments:    started smoking at age 38  Vaping Use   Vaping Use: Former  Substance and Sexual Activity   Alcohol use: Yes    Comment: 1 times per month   Drug use: Not Currently    Comment: former crack cocaine user   Sexual activity: Yes    Partners: Male    Birth control/protection: Post-menopausal, Surgical  Other Topics Concern   Not on file  Social History Narrative   Live with brother and his husband   Social Determinants of Health   Financial Resource Strain: High Risk (07/27/2017)   Overall  Financial Resource Strain (CARDIA)    Difficulty of Paying Living Expenses: Hard  Food Insecurity: Food Insecurity Present (07/27/2017)   Hunger Vital Sign    Worried About Running Out of Food in the Last Year: Often true    Ran Out of Food in the Last Year: Often true  Transportation Needs: Unmet Transportation Needs (07/27/2017)   PRAPARE - Transportation    Lack of Transportation (Medical): Yes    Lack of Transportation (Non-Medical): Yes  Physical Activity: Insufficiently Active (07/27/2017)   Exercise Vital Sign    Days of Exercise per Week:  4 days    Minutes of Exercise per Session: 20 min  Stress: Stress Concern Present (07/27/2017)   [place]    Feeling of Stress : Very much  Social Connections: Somewhat Isolated (07/27/2017)   Social Connection and Isolation Panel [NHANES]    Frequency of Communication with Friends and Family: More than three times a week    Frequency of Social Gatherings with Friends and Family: Once a week    Attends Religious Services: Never    Marine scientist or Organizations: No    Attends Archivist Meetings: Never    Marital Status: Married    Allergies:  Allergies  Allergen Reactions   Pregabalin Other (See Comments)    Other reaction(s): Other (See Comments) Suicidal ideation  Suicidal ideation    Metabolic Disorder Labs: Lab Results  Component Value Date   HGBA1C 5.8 (A) 06/02/2021   No results found for: "PROLACTIN" Lab Results  Component Value Date   CHOL 154 12/02/2020   TRIG 161 (H) 12/02/2020   HDL 59 12/02/2020   CHOLHDL 2.6 12/02/2020   LDLCALC 68 12/02/2020   LDLCALC 77 05/27/2020   Lab Results  Component Value Date   TSH 0.801 12/02/2020   TSH 0.983 11/27/2019    Therapeutic Level Labs: No results found for: "LITHIUM" No results found for: "VALPROATE" No results found for: "CBMZ"  Current Medications: Current Outpatient  Medications  Medication Sig Dispense Refill   amLODipine (NORVASC) 10 MG tablet Take 1 tablet by mouth daily.     doxepin (SINEQUAN) 10 MG capsule TAKE 1 CAPSULE (10 MG TOTAL) BY MOUTH AT BEDTIME AS NEEDED. FOR SLEEP 90 capsule 0   hydrOXYzine (ATARAX) 10 MG tablet TAKE 1 TABLET BY MOUTH 3 TIMES DAILY AS NEEDED FOR ANXIETY. 270 tablet 0   lamoTRIgine (LAMICTAL) 100 MG tablet Take 1 tablet (100 mg total) by mouth 2 (two) times daily. 180 tablet 0   meloxicam (MOBIC) 15 MG tablet TAKE 1 TABLET BY MOUTH EVERY DAY 30 tablet 2   methocarbamol (ROBAXIN) 500 MG tablet Take 500 mg by mouth 2 (two) times daily.     rosuvastatin (CRESTOR) 5 MG tablet TAKE 1 TABLET BY MOUTH EVERY OTHER DAY 45 tablet 3   triamterene-hydrochlorothiazide (MAXZIDE-25) 37.5-25 MG tablet TAKE 1 TABLET BY MOUTH EVERY DAY 90 tablet 0   vitamin B-12 (CYANOCOBALAMIN) 1000 MCG tablet Take 1,000 mcg by mouth daily.     KLOR-CON M20 20 MEQ tablet TAKE 1 TABLET BY MOUTH EVERY DAY (Patient not taking: Reported on 10/16/2021) 90 tablet 1   methylPREDNISolone (MEDROL DOSEPAK) 4 MG TBPK tablet See admin instructions. (Patient not taking: Reported on 10/16/2021)     nicotine (NICODERM CQ - DOSED IN MG/24 HOURS) 14 mg/24hr patch Place 1 patch (14 mg total) onto the skin daily. (Patient not taking: Reported on 11/24/2021) 28 patch 1   predniSONE (DELTASONE) 5 MG tablet Take as directed - 6 day taper.  Take as needed pain flareup (Patient not taking: Reported on 11/24/2021)     No current facility-administered medications for this visit.     Musculoskeletal: Strength & Muscle Tone:  UTA Gait & Station:  Seated Patient leans: N/A  Psychiatric Specialty Exam: Review of Systems  Psychiatric/Behavioral: Negative.    All other systems reviewed and are negative.   Last menstrual period 12/07/2011.There is no height or weight on file to calculate BMI.  General Appearance: Casual  Eye Contact:  Fair  Speech:  Clear and Coherent  Volume:   Normal  Mood:  Euthymic  Affect:  Congruent  Thought Process:  Goal Directed and Descriptions of Associations: Intact  Orientation:  Full (Time, Place, and Person)  Thought Content: Logical   Suicidal Thoughts:  No  Homicidal Thoughts:  No  Memory:  Immediate;   Fair Recent;   Fair Remote;   Fair  Judgement:  Fair  Insight:  Fair  Psychomotor Activity:  Normal  Concentration:  Concentration: Fair and Attention Span: Fair  Recall:  [employer] of Knowledge: Fair  Language: Fair  Akathisia:  No  Handed:  Right  AIMS (if indicated): not done  Assets:  Communication Skills Desire for [place] Talents/Skills Transportation  ADL's:  Intact  Cognition: WNL  Sleep:  Fair   Screenings: AIMS    Flowsheet Row Video Visit from 10/16/2021 in [place] Video Visit from 07/17/2021 in [place] Total Score 0 0      GAD-7    Flowsheet Row Video Visit from 10/16/2021 in [place] Video Visit from 07/17/2021 in [place] Video Visit from 06/19/2021 in [place] Office Visit from 01/14/2021 in [place] Video Visit from 01/22/2020 in [place]  Total GAD-7 Score '13 2 13 1 15      '$ PHQ2-9    Flowsheet Row Video Visit from 11/24/2021 in [place] Video Visit from 10/16/2021 in [place] Video Visit from 07/17/2021 in [place] Video Visit from 06/19/2021 in [place] Video Visit from 04/16/2021 in [place]  PHQ-2 Total Score 0 0 0 2 0  PHQ-9 Total Score -- -- -- 5 --      Flowsheet Row Video Visit from 11/24/2021 in [place] Video Visit from 10/16/2021 in [place] Video Visit from 06/19/2021 in [place]        Assessment and Plan: NNEOMA HARRAL is a 61 year old Caucasian female, divorced, on SSI, lives in [place], has a history of bipolar disorder, borderline personality disorder, insomnia was evaluated by telemedicine today.  Patient is currently stable.  Plan Bipolar disorder in remission Lamotrigine 100 mg p.o. twice daily  GAD-improving Hydroxyzine 10 mg p.o. twice daily as needed Patient was referred for CBT-noncompliant.  Insomnia-stable Doxepin 10 mg p.o. nightly as needed Continue sleep hygiene techniques  Bereavement-improving Patient was referred for CBT-pending  Follow-up in clinic in 3 months or sooner if needed.    Consent: Patient/Guardian gives verbal consent for treatment and assignment of benefits for services provided during this visit. Patient/Guardian expressed understanding and agreed to proceed.   This note was generated in part or whole with voice recognition software. Voice recognition is usually quite accurate but there are transcription errors that can and very often do occur. I apologize for any typographical errors that were not detected and corrected.  This note was generated in part or whole with voice recognition software. Voice recognition is usually quite accurate but there are transcription errors that can and very often do occur. I apologize for any typographical errors that were not detected and corrected.         Ursula Alert, MD 11/25/2021, 5:29 PM

## 2021-12-09 ENCOUNTER — Encounter: Payer: Medicaid Other | Admitting: Family Medicine

## 2021-12-22 ENCOUNTER — Other Ambulatory Visit: Payer: Self-pay | Admitting: Family Medicine

## 2022-01-16 ENCOUNTER — Other Ambulatory Visit: Payer: Self-pay | Admitting: Psychiatry

## 2022-01-16 DIAGNOSIS — F411 Generalized anxiety disorder: Secondary | ICD-10-CM

## 2022-01-21 NOTE — Progress Notes (Unsigned)
   I,Wilda Wetherell S Maili Shutters,acting as a scribe for [name], MD.,have documented all relevant documentation on the behalf of [name], MD,as directed by  [name], MD while in the presence of [name], MD.     Established patient visit   Patient: Alexa Newman   DOB: 18-Sep-1960   61 y.o. Female  MRN: [id number] Visit Date: 01/22/2022  Today's healthcare provider: [name], MD   No chief complaint on file.  Subjective    Hip Pain  The pain is present in the right hip.    ***  Medications: Outpatient Medications Prior to Visit  Medication Sig   amLODipine (NORVASC) 10 MG tablet TAKE 1 TABLET BY MOUTH EVERY DAY   doxepin (SINEQUAN) 10 MG capsule TAKE 1 CAPSULE (10 MG TOTAL) BY MOUTH AT BEDTIME AS NEEDED. FOR SLEEP   hydrOXYzine (ATARAX) 10 MG tablet TAKE 1 TABLET BY MOUTH 3 TIMES DAILY AS NEEDED FOR ANXIETY.   KLOR-CON M20 20 MEQ tablet TAKE 1 TABLET BY MOUTH EVERY DAY (Patient not taking: Reported on 10/16/2021)   lamoTRIgine (LAMICTAL) 100 MG tablet TAKE 1 TABLET BY MOUTH TWICE A DAY   meloxicam (MOBIC) 15 MG tablet TAKE 1 TABLET BY MOUTH EVERY DAY   methocarbamol (ROBAXIN) 500 MG tablet Take 500 mg by mouth 2 (two) times daily.   methylPREDNISolone (MEDROL DOSEPAK) 4 MG TBPK tablet See admin instructions. (Patient not taking: Reported on 10/16/2021)   nicotine (NICODERM CQ - DOSED IN MG/24 HOURS) 14 mg/24hr patch Place 1 patch (14 mg total) onto the skin daily. (Patient not taking: Reported on 11/24/2021)   predniSONE (DELTASONE) 5 MG tablet Take as directed - 6 day taper.  Take as needed pain flareup (Patient not taking: Reported on 11/24/2021)   rosuvastatin (CRESTOR) 5 MG tablet TAKE 1 TABLET BY MOUTH EVERY OTHER DAY   triamterene-hydrochlorothiazide (MAXZIDE-25) 37.5-25 MG tablet TAKE 1 TABLET BY MOUTH EVERY DAY   vitamin B-12 (CYANOCOBALAMIN) 1000 MCG tablet Take 1,000 mcg by mouth daily.   No facility-administered medications prior to  visit.    Review of Systems  {Labs  Heme  Chem  Endocrine  Serology  Results Review (optional):23779}   Objective    LMP 12/07/2011  {Show previous vital signs (optional):23777}  Physical Exam  ***  No results found for any visits on 01/22/22.  Assessment & Plan     ***  No follow-ups on file.      {provider attestation***:1}   [name], MD  [facility] [phone] (phone) [phone] (fax)  [place]

## 2022-01-22 ENCOUNTER — Ambulatory Visit
Admission: RE | Admit: 2022-01-22 | Discharge: 2022-01-22 | Disposition: A | Discharge status: Home / Self Care | Payer: Medicaid Other | Attending: Family Medicine | Admitting: Family Medicine

## 2022-01-22 ENCOUNTER — Encounter: Payer: Self-pay | Admitting: Family Medicine

## 2022-01-22 ENCOUNTER — Ambulatory Visit: Payer: Medicaid Other | Admitting: Family Medicine

## 2022-01-22 ENCOUNTER — Ambulatory Visit
Admission: RE | Admit: 2022-01-22 | Discharge: 2022-01-22 | Disposition: A | Discharge status: Home / Self Care | Payer: Medicaid Other | Source: Ambulatory Visit | Attending: Family Medicine | Admitting: Family Medicine

## 2022-01-22 VITALS — BP 114/77 | HR 93 | Temp 98.0°F | Resp 16 | Wt 192.0 lb

## 2022-01-22 DIAGNOSIS — M25551 Pain in right hip: Secondary | ICD-10-CM | POA: Insufficient documentation

## 2022-01-22 MED ORDER — PREDNISONE 10 MG PO TABS: ORAL_TABLET | ORAL | 0 refills | Status: DC

## 2022-01-25 ENCOUNTER — Other Ambulatory Visit: Payer: Self-pay | Admitting: Family Medicine

## 2022-01-26 NOTE — Telephone Encounter (Signed)
Requested medication (s) are due for refill today: yes  Requested medication (s) are on the active medication list: yes  Last refill:  11/03/21#90  Future visit scheduled: yes  Notes to clinic:  overdue lab work   Requested Prescriptions  Pending Prescriptions Disp Refills   triamterene-hydrochlorothiazide (MAXZIDE-25) 37.5-25 MG tablet [Pharmacy Med Name: TRIAMTERENE-HCTZ 37.5-25 MG TB] 90 tablet 0    Sig: TAKE 1 TABLET BY MOUTH EVERY DAY     Cardiovascular: Diuretic Combos Failed - 01/25/2022 12:30 PM      Failed - K in normal range and within 180 days    Potassium  Date Value Ref Range Status  06/03/2021 3.7 3.5 - 5.2 mmol/L Final         Failed - Na in normal range and within 180 days    Sodium  Date Value Ref Range Status  06/03/2021 140 134 - 144 mmol/L Final         Failed - Cr in normal range and within 180 days    Creatinine, Ser  Date Value Ref Range Status  06/03/2021 0.82 0.57 - 1.00 mg/dL Final         Passed - Last BP in normal range    BP Readings from Last 1 Encounters:  01/22/22 114/77         Passed - Valid encounter within last 6 months    Recent Outpatient Visits           4 days ago Right hip pain   [facility], Dionne Bucy, MD   7 months ago Essential hypertension   [place], Dionne Bucy, MD   1 year ago Encounter for annual physical exam   [facility], Dionne Bucy, MD   1 year ago Left hip pain   [facility], Vickki Muff, PA-C   1 year ago Essential hypertension   [place], Dionne Bucy, MD       Future Appointments             In 1 month Bacigalupo, Dionne Bucy, MD [facility], [place]

## 2022-02-03 DIAGNOSIS — M25851 Other specified joint disorders, right hip: Secondary | ICD-10-CM | POA: Diagnosis not present

## 2022-02-03 DIAGNOSIS — M1611 Unilateral primary osteoarthritis, right hip: Secondary | ICD-10-CM | POA: Diagnosis not present

## 2022-02-05 ENCOUNTER — Other Ambulatory Visit: Payer: Self-pay | Admitting: Family Medicine

## 2022-02-05 NOTE — Telephone Encounter (Signed)
Requested medication (s) are due for refill today: yes  Requested medication (s) are on the active medication list: yes  Last refill:  11/03/21 #30 2 RF  Future visit scheduled: yes  Notes to clinic:   overdue lab work    Requested Prescriptions  Pending Prescriptions Disp Refills   meloxicam ([place]) 15 MG tablet [Pharmacy Med Name: MELOXICAM 15 MG TABLET] 30 tablet 2    Sig: TAKE 1 TABLET BY MOUTH EVERY DAY     Analgesics:  COX2 Inhibitors Failed - 02/05/2022 11:07 AM      Failed - Manual Review: Labs are only required if the patient has taken medication for more than 8 weeks.      Failed - HGB in normal range and within 360 days    Hemoglobin  Date Value Ref Range Status  12/02/2020 13.3 11.1 - 15.9 g/dL Final         Failed - HCT in normal range and within 360 days    Hematocrit  Date Value Ref Range Status  12/02/2020 41.8 34.0 - 46.6 % Final         Failed - AST in normal range and within 360 days    AST  Date Value Ref Range Status  01/03/2021 14 0 - 40 IU/L Final         Failed - ALT in normal range and within 360 days    ALT  Date Value Ref Range Status  01/03/2021 13 0 - 32 IU/L Final         Passed - Cr in normal range and within 360 days    Creatinine, Ser  Date Value Ref Range Status  06/03/2021 0.82 0.57 - 1.00 mg/dL Final         Passed - eGFR is 30 or above and within 360 days    GFR calc Af Amer  Date Value Ref Range Status  11/27/2019 103 >59 mL/min/1.73 Final    Comment:    **[facility] currently reports eGFR in compliance with the current**   recommendations of the [employer]. [facility] will   update reporting as new guidelines are published from the NKF-ASN   Task force.    GFR, Estimated  Date Value Ref Range Status  09/24/2020 >60 >60 mL/min Final    Comment:    (NOTE) Calculated using the CKD-EPI Creatinine Equation (2021)    eGFR  Date Value Ref Range Status  06/03/2021 82 >59 mL/min/1.73 Final         Passed  - Patient is not pregnant      Passed - Valid encounter within last 12 months    Recent Outpatient Visits           2 weeks ago Right hip pain   [facility], Dionne Bucy, MD   8 months ago Essential hypertension   [place], Dionne Bucy, MD   1 year ago Encounter for annual physical exam   [facility], Dionne Bucy, MD   1 year ago Left hip pain   [place], Vickki Muff, PA-C   1 year ago Essential hypertension   [place], Dionne Bucy, MD       Future Appointments             In 1 month Bacigalupo, Dionne Bucy, MD [facility], [place]

## 2022-02-06 ENCOUNTER — Other Ambulatory Visit: Payer: Self-pay | Admitting: Psychiatry

## 2022-02-06 DIAGNOSIS — F411 Generalized anxiety disorder: Secondary | ICD-10-CM

## 2022-02-06 DIAGNOSIS — F5105 Insomnia due to other mental disorder: Secondary | ICD-10-CM

## 2022-02-11 DIAGNOSIS — M1611 Unilateral primary osteoarthritis, right hip: Secondary | ICD-10-CM | POA: Diagnosis not present

## 2022-02-11 DIAGNOSIS — M25851 Other specified joint disorders, right hip: Secondary | ICD-10-CM | POA: Diagnosis not present

## 2022-02-24 DIAGNOSIS — M25851 Other specified joint disorders, right hip: Secondary | ICD-10-CM | POA: Diagnosis not present

## 2022-02-24 DIAGNOSIS — M1611 Unilateral primary osteoarthritis, right hip: Secondary | ICD-10-CM | POA: Diagnosis not present

## 2022-02-26 ENCOUNTER — Other Ambulatory Visit: Payer: Self-pay | Admitting: Orthopedic Surgery

## 2022-02-26 DIAGNOSIS — M1611 Unilateral primary osteoarthritis, right hip: Secondary | ICD-10-CM

## 2022-02-26 DIAGNOSIS — M25851 Other specified joint disorders, right hip: Secondary | ICD-10-CM

## 2022-03-03 ENCOUNTER — Ambulatory Visit
Admission: RE | Admit: 2022-03-03 | Discharge: 2022-03-03 | Disposition: A | Discharge status: Home / Self Care | Payer: Medicaid Other | Source: Ambulatory Visit | Attending: Orthopedic Surgery | Admitting: Orthopedic Surgery

## 2022-03-03 DIAGNOSIS — M25851 Other specified joint disorders, right hip: Secondary | ICD-10-CM | POA: Diagnosis not present

## 2022-03-03 DIAGNOSIS — M1611 Unilateral primary osteoarthritis, right hip: Secondary | ICD-10-CM | POA: Insufficient documentation

## 2022-03-03 DIAGNOSIS — S32401A Unspecified fracture of right acetabulum, initial encounter for closed fracture: Secondary | ICD-10-CM | POA: Diagnosis not present

## 2022-03-03 DIAGNOSIS — S32511A Fracture of superior rim of right pubis, initial encounter for closed fracture: Secondary | ICD-10-CM | POA: Diagnosis not present

## 2022-03-03 DIAGNOSIS — S73191A Other sprain of right hip, initial encounter: Secondary | ICD-10-CM | POA: Diagnosis not present

## 2022-03-05 ENCOUNTER — Other Ambulatory Visit: Payer: Self-pay | Admitting: Family Medicine

## 2022-03-05 ENCOUNTER — Ambulatory Visit: Payer: Self-pay | Admitting: *Deleted

## 2022-03-05 MED ORDER — NIRMATRELVIR/RITONAVIR (PAXLOVID)TABLET: 3.0000 | ORAL_TABLET | Freq: Two times a day (BID) | ORAL | 0 refills | Status: AC

## 2022-03-05 NOTE — Telephone Encounter (Signed)
Summary: covid   Pt has just tested positive for Covid and wants to now what to do next fur at [phone]       Chief Complaint: covid positive test today  Symptoms: headache no other sx  Frequency: 2 days  Pertinent Negatives: Patient denies chest pain no difficulty breathing no fever no other sx except headache  Disposition: '[]'$ ED /'[]'$ Urgent Care (no appt availability in office) / '[]'$ Appointment(In office/virtual)/ '[]'$  [facility]/ '[]'$ Home Care/ '[]'$ Refused Recommended Disposition /'[]'$ [facility]/ '[x]'$  Follow-up with PCP Additional Notes:   Patient requesting if she needs antiviral medication needed and appt due to only sx headache. Hx obese and HTN. Please advise if patient can keep her appt 03/13/22 physical.  Patient would like a call back      Reason for Disposition  [1] HIGH RISK patient (e.g., weak immune system, age > 44 years, obesity with BMI 30 or higher, pregnant, chronic lung disease or other chronic medical condition) AND [2] COVID symptoms (e.g., cough, fever)  (Exceptions: Already seen by PCP and no new or worsening symptoms.)  Answer Assessment - Initial Assessment Questions 1. COVID-19 DIAGNOSIS: "How do you know that you have COVID?" (e.g., positive lab test or self-test, diagnosed by doctor or NP/PA, symptoms after exposure).     Covid positive at home test today  2. COVID-19 EXPOSURE: "Was there any known exposure to COVID before the symptoms began?" CDC Definition of close contact: within 6 feet (2 meters) for a total of 15 minutes or more over a 24-hour period.      Brother / family  3. ONSET: "When did the COVID-19 symptoms start?"      2 days ago headache  4. WORST SYMPTOM: "What is your worst symptom?" (e.g., cough, fever, shortness of breath, muscle aches)     Headache  5. COUGH: "Do you have a cough?" If Yes, ask: "How bad is the cough?"       no 6. FEVER: "Do you have a fever?" If Yes, ask: "What is your temperature, how was it measured,  and when did it start?"     no 7. RESPIRATORY STATUS: "Describe your breathing?" (e.g., normal; shortness of breath, wheezing, unable to speak)      na 8. BETTER-SAME-WORSE: "Are you getting better, staying the same or getting worse compared to yesterday?"  If getting worse, ask, "In what way?"     na 9. OTHER SYMPTOMS: "Do you have any other symptoms?"  (e.g., chills, fatigue, headache, loss of smell or taste, muscle pain, sore throat)     headache 10. HIGH RISK DISEASE: "Do you have any chronic medical problems?" (e.g., asthma, heart or lung disease, weak immune system, obesity, etc.)       na 11. VACCINE: "Have you had the COVID-19 vaccine?" If Yes, ask: "Which one, how many shots, when did you get it?"       Yes  12. PREGNANCY: "Is there any chance you are pregnant?" "When was your last menstrual period?"       na 13. O2 SATURATION MONITOR:  "Do you use an oxygen saturation monitor (pulse oximeter) at home?" If Yes, ask "What is your reading (oxygen level) today?" "What is your usual oxygen saturation reading?" (e.g., 95%)       na  Protocols used: Coronavirus (COVID-19) Diagnosed or Suspected-A-AH

## 2022-03-06 ENCOUNTER — Telehealth (INDEPENDENT_AMBULATORY_CARE_PROVIDER_SITE_OTHER): Payer: Medicaid Other | Admitting: Psychiatry

## 2022-03-06 ENCOUNTER — Encounter: Payer: Self-pay | Admitting: Psychiatry

## 2022-03-06 DIAGNOSIS — F17200 Nicotine dependence, unspecified, uncomplicated: Secondary | ICD-10-CM

## 2022-03-06 DIAGNOSIS — Z8659 Personal history of other mental and behavioral disorders: Secondary | ICD-10-CM | POA: Diagnosis not present

## 2022-03-06 DIAGNOSIS — F5105 Insomnia due to other mental disorder: Secondary | ICD-10-CM | POA: Diagnosis not present

## 2022-03-06 DIAGNOSIS — F411 Generalized anxiety disorder: Secondary | ICD-10-CM | POA: Diagnosis not present

## 2022-03-06 DIAGNOSIS — F3178 Bipolar disorder, in full remission, most recent episode mixed: Secondary | ICD-10-CM | POA: Diagnosis not present

## 2022-03-06 DIAGNOSIS — F1421 Cocaine dependence, in remission: Secondary | ICD-10-CM

## 2022-03-06 DIAGNOSIS — Z634 Disappearance and death of family member: Secondary | ICD-10-CM | POA: Diagnosis not present

## 2022-03-06 NOTE — Progress Notes (Signed)
I,Alexa Newman,acting as a Education administrator for Alexa Paganini, MD.,have documented all relevant documentation on the behalf of Alexa Paganini, MD,as directed by  Alexa Paganini, MD while in the presence of Alexa Paganini, MD.    Complete physical exam   Patient: Alexa Newman   DOB: [birth year]   61 y.o. Female  MRN: [id number] Visit Date: 03/13/2022  Today's healthcare provider: [name], MD   Chief Complaint  Patient presents with   Annual Exam   Subjective    Alexa Newman is a 61 y.o. female who presents today for a complete physical exam.  She reports consuming a general diet. The patient does not participate in regular exercise at present. She generally feels well. She reports sleeping well. She does have additional problems to discuss today.  HPI   L>R foot cold.  Chest tightness after COVID.  Wants to quit smoking. Was using the patch and it works well. Smoking about 1 ppd of menthol cigarettes.  Concerns about memory - MMSE 30/30. Is worse when stressed/anxious.  Past Medical History:  Diagnosis Date   Anxiety    Arthritis    Bipolar 1 disorder ([place])    Depression    Family history of adverse reaction to anesthesia    half sister claims that she would stop breathing during surgery x 2   Hypertension    Osteoarthritis    PONV (postoperative nausea and vomiting)    Pre-diabetes    Past Surgical History:  Procedure Laterality Date   ANTERIOR CRUCIATE LIGAMENT (ACL) REVISION Right    CARPAL TUNNEL RELEASE     COLONOSCOPY     COLONOSCOPY WITH PROPOFOL N/A 11/17/2017   Procedure: COLONOSCOPY WITH PROPOFOL;  Surgeon: Virgel Manifold, MD;  Location: [facility] END[facility]COPY;  Service: Endoscopy;  Laterality: N/A;   DILATION AND CURETTAGE OF UTERUS  2013   for heavy bleeding   ENDOMETRIAL ABLATION  2013   KNEE ARTHROPLASTY Right 10/02/2020   Procedure: COMPUTER ASSISTED TOTAL KNEE ARTHROPLASTY;  Surgeon: Dereck Leep, MD;  Location: [facility] ORS;   Service: Orthopedics;  Laterality: Right;   right wrist surgery     TUBAL LIGATION     WRIST ARTHROSCOPY Left 1990   for degenerative changes   Social History   Socioeconomic History   Marital status: Divorced    Spouse name: Not on file   Number of children: 1   Years of education: Not on file   Highest education level: Some college, no degree  Occupational History   Occupation: disabled    Comment: on SSI awaiting decision about disability  Tobacco Use   Smoking status: Every Day    Packs/day: 0.50    Years: 45.00    Total pack years: 22.50    Types: Cigarettes   Smokeless tobacco: Never   Tobacco comments:    started smoking at age 79  Vaping Use   Vaping Use: Former  Substance and Sexual Activity   Alcohol use: Yes    Comment: 1 times per month   Drug use: Not Currently    Comment: former crack cocaine user   Sexual activity: Yes    Partners: Male    Birth control/protection: Post-menopausal, Surgical  Other Topics Concern   Not on file  Social History Narrative   Live with brother and his husband   Social Determinants of Health   Financial Resource Strain: High Risk (07/27/2017)   Overall Financial Resource Strain (CARDIA)    Difficulty of Paying Living Expenses:  Hard  Food Insecurity: Food Insecurity Present (07/27/2017)   Hunger Vital Sign    Worried About Running Out of Food in the Last Year: Often true    Ran Out of Food in the Last Year: Often true  Transportation Needs: Unmet Transportation Needs (07/27/2017)   PRAPARE - Hydrologist (Medical): Yes    Lack of Transportation (Non-Medical): Yes  Physical Activity: Insufficiently Active (07/27/2017)   Exercise Vital Sign    Days of Exercise per Week: 4 days    Minutes of Exercise per Session: 20 min  Stress: Stress Concern Present (07/27/2017)   [place]    Feeling of Stress : Very much  Social Connections:  Somewhat Isolated (07/27/2017)   Social Connection and Isolation Panel [NHANES]    Frequency of Communication with Friends and Family: More than three times a week    Frequency of Social Gatherings with Friends and Family: Once a week    Attends Religious Services: Never    Marine scientist or Organizations: No    Attends Archivist Meetings: Never    Marital Status: Married  Human resources officer Violence: Not At Risk (07/27/2017)   Humiliation, Afraid, Rape, and Kick questionnaire    Fear of Current or Ex-Partner: No    Emotionally Abused: No    Physically Abused: No    Sexually Abused: No   Family Status  Relation Name Status   Mother  Deceased   Father  Deceased   Sister  Alive   Brother  Alive   Sister  Alive   Sister  Alive   Sister  Alive   Brother  Alive   MGF  (Not Specified)   Neg Hx  (Not Specified)   Family History  Problem Relation Age of Onset   Diabetes Mother    Depression Mother    Hypertension Mother    Crohn's disease Mother    Lung cancer Mother 14       former smoker   Congestive Heart Failure Father    Breast cancer Sister 28   Alcohol abuse Sister    Drug abuse Sister    Anxiety disorder Sister    Depression Sister    Bipolar disorder Sister    Alcohol abuse Brother    Drug abuse Brother    Anxiety disorder Brother    Depression Brother    Colon polyps Brother    HIV Brother    Alcohol abuse Brother    Drug abuse Brother    Anxiety disorder Brother    Depression Brother    Colon polyps Brother    HIV Brother    Skin cancer Maternal Grandfather    Colon cancer Neg Hx    Ovarian cancer Neg Hx    Cervical cancer Neg Hx    Allergies  Allergen Reactions   Pregabalin Other (See Comments)    Other reaction(s): Other (See Comments) Suicidal ideation  Suicidal ideation    Patient Care Team: Virginia Crews, MD as PCP - General (Family Medicine)   Medications: Outpatient Medications Prior to Visit  Medication Sig    amLODipine (NORVASC) 10 MG tablet TAKE 1 TABLET BY MOUTH EVERY DAY   celecoxib (CELEBREX) 100 MG capsule Take 200 mg by mouth 2 (two) times daily.   doxepin (SINEQUAN) 10 MG capsule TAKE 1 CAPSULE (10 MG TOTAL) BY MOUTH AT BEDTIME AS NEEDED. FOR SLEEP   hydrOXYzine (ATARAX) 10 MG  tablet TAKE 1 TABLET BY MOUTH 3 TIMES DAILY AS NEEDED FOR ANXIETY.   lamoTRIgine (LAMICTAL) 100 MG tablet TAKE 1 TABLET BY MOUTH TWICE A DAY   methocarbamol (ROBAXIN) 500 MG tablet Take 500 mg by mouth 2 (two) times daily.   rosuvastatin (CRESTOR) 5 MG tablet TAKE 1 TABLET BY MOUTH EVERY OTHER DAY   triamterene-hydrochlorothiazide (MAXZIDE-25) 37.5-25 MG tablet TAKE 1 TABLET BY MOUTH EVERY DAY   UNABLE TO FIND Take 1 tablet by mouth in the morning and at bedtime. Med Name: calcium, magnesium and zinc   vitamin B-12 (CYANOCOBALAMIN) 1000 MCG tablet Take 1,000 mcg by mouth daily.   [DISCONTINUED] nicotine (NICODERM CQ - DOSED IN MG/24 HOURS) 14 mg/24hr patch Place 1 patch (14 mg total) onto the skin daily.   [DISCONTINUED] meloxicam (MOBIC) 15 MG tablet TAKE 1 TABLET BY MOUTH EVERY DAY (Patient not taking: Reported on 03/13/2022)   [DISCONTINUED] predniSONE (DELTASONE) 10 MG tablet Take 27m PO daily x1d, then 520mdaily x1d, then 4060maily x1d, then 44m21mily x1d, then 20mg2mly x1d, then 10mg 73my x1d, then stop (Patient not taking: Reported on 03/13/2022)   No facility-administered medications prior to visit.    Review of Systems  All other systems reviewed and are negative.   Last CBC Lab Results  Component Value Date   WBC 10.5 12/02/2020   HGB 13.3 12/02/2020   HCT 41.8 12/02/2020   MCV 79 12/02/2020   MCH 25.2 (L) 12/02/2020   RDW 12.7 12/02/2020   PLT 351 12/02/20/48/2500t metabolic panel Lab Results  Component Value Date   GLUCOSE 97 06/03/2021   NA 140 06/03/2021   K 3.7 06/03/2021   CL 102 06/03/2021   CO2 25 06/03/2021   BUN 15 06/03/2021   CREATININE 0.82 06/03/2021   EGFR 82 06/03/2021    CALCIUM 10.3 06/03/2021   PHOS 4.4 (H) 01/16/2019   PROT 6.7 01/03/2021   ALBUMIN 4.5 01/03/2021   LABGLOB 2.2 01/03/2021   AGRATIO 2.0 01/03/2021   BILITOT 0.3 01/03/2021   ALKPHOS 130 (H) 01/03/2021   AST 14 01/03/2021   ALT 13 01/03/2021   ANIONGAP 7 09/24/2020   Last lipids Lab Results  Component Value Date   CHOL 154 12/02/2020   HDL 59 12/02/2020   LDLCALC 68 12/02/2020   TRIG 161 (H) 12/02/2020   CHOLHDL 2.6 12/02/2020   Last hemoglobin A1c Lab Results  Component Value Date   HGBA1C 5.8 (A) 06/02/2021   Last thyroid functions Lab Results  Component Value Date   TSH 0.801 12/02/2020   Last vitamin D Lab Results  Component Value Date   VD25OH 36.3 12/02/2020   Last vitamin B12 and Folate Lab Results  Component Value Date   VITAMINB12 534 01/03/2021      Objective    BP 121/77 (BP Location: Left Arm, Patient Position: Sitting, Cuff Size: Large)   Pulse 98   Temp 98.5 F (36.9 C) (Oral)   Resp 16   Ht 5' 4" (1.626 m)   Wt 195 lb (88.5 kg)   LMP 12/07/2011   BMI 33.47 kg/m  BP Readings from Last 3 Encounters:  03/13/22 121/77  01/22/22 114/77  06/02/21 131/80   Wt Readings from Last 3 Encounters:  03/13/22 195 lb (88.5 kg)  01/22/22 192 lb (87.1 kg)  07/16/21 180 lb (81.6 kg)      Physical Exam Vitals reviewed.  Constitutional:      General: She is not in acute distress.  Appearance: Normal appearance. She is well-developed. She is not diaphoretic.  HENT:     Head: Normocephalic and atraumatic.  Eyes:     General: No scleral icterus.    Conjunctiva/sclera: Conjunctivae normal.     Pupils: Pupils are equal, round, and reactive to light.  Neck:     Thyroid: No thyromegaly.  Cardiovascular:     Rate and Rhythm: Normal rate and regular rhythm.     Pulses: Normal pulses.     Heart sounds: Normal heart sounds. No murmur heard. Pulmonary:     Effort: Pulmonary effort is normal. No respiratory distress.     Breath sounds: Normal  breath sounds. No wheezing or rales.  Abdominal:     General: There is no distension.     Palpations: Abdomen is soft.     Tenderness: There is no abdominal tenderness.  Musculoskeletal:        General: No deformity.     Cervical back: Neck supple.     Right lower leg: No edema.     Left lower leg: No edema.  Lymphadenopathy:     Cervical: No cervical adenopathy.  Skin:    General: Skin is warm and dry.     Findings: No rash.  Neurological:     Mental Status: She is alert and oriented to person, place, and time. Mental status is at baseline.  Psychiatric:        Mood and Affect: Mood normal.        Behavior: Behavior normal.        Thought Content: Thought content normal.      Monofilament and pulses intact in feet.   Last depression screening scores    03/13/2022    8:26 AM 11/24/2021    2:45 PM 10/16/2021   11:39 AM  PHQ 2/9 Scores  PHQ - 2 Score 1    PHQ- 9 Score 3       Information is confidential and restricted. Go to Review Flowsheets to unlock data.   Last fall risk screening    03/13/2022    8:26 AM  [place] in the past year? 0  Number falls in past yr: 0  Injury with Fall? 0  Risk for fall due to : No Fall Risks  Follow up Falls evaluation completed   Last Audit-C alcohol use screening    03/13/2022    8:26 AM  Alcohol Use Disorder Test (AUDIT)  1. How often do you have a drink containing alcohol? 0  2. How many drinks containing alcohol do you have on a typical day when you are drinking? 0  3. How often do you have six or more drinks on one occasion? 0  AUDIT-C Score 0   A score of 3 or more in women, and 4 or more in men indicates increased risk for alcohol abuse, EXCEPT if all of the points are from question 1   No results found for any visits on 03/13/22.  Assessment & Plan    Routine Health Maintenance and Physical Exam  Exercise Activities and Dietary recommendations  Goals   None     Immunization History  Administered Date(s)  Administered   Hep A / Hep B 11/26/2006, 01/21/2007, 12/20/2008   Influenza,inj,Quad PF,6+ Mos 01/13/2018, 11/17/2018, 11/27/2019, 12/02/2020   Moderna Sars-Covid-2 Vaccination 05/31/2019, 06/28/2019, 01/26/2020   Tdap 01/21/2007, 09/20/2009, 11/27/2019    Health Maintenance  Topic Date Due   Zoster Vaccines- Shingrix (1 of 2) Never done  COVID-19 Vaccine (4 - 2023-24 season) 11/07/2021   INFLUENZA VACCINE  06/07/2022 (Originally 10/07/2021)   Lung Cancer Screening  07/17/2022   PAP SMEAR-Modifier  10/14/2022   COLONOSCOPY (Pts 45-13yr Insurance coverage will need to be confirmed)  11/18/2022   MAMMOGRAM  07/17/2023   DTaP/Tdap/Td (4 - Td or Tdap) 11/26/2029   Hepatitis C Screening  Completed   HIV Screening  Completed   HPV VACCINES  Aged Out    Discussed health benefits of physical activity, and encouraged her to engage in regular exercise appropriate for her age and condition.  Problem List Items Addressed This Visit       Cardiovascular and Mediastinum   Essential hypertension    Well controlled Continue current medications Recheck metabolic panel F/u in 6 months       Relevant Orders   Comprehensive metabolic panel     Other   Tobacco use disorder    Ready to quit smoking Will Rx nicotine patch 21 mg daily.   Discussed stepping down on patches.      Obesity    Discussed importance of healthy weight management Discussed diet and exercise       Prediabetes    Recommend low carb diet Recheck A1c       Relevant Orders   Hemoglobin A1c   Hyperlipidemia    Previously well controlled Continue statin Repeat FLP and CMP      Relevant Orders   Lipid Panel With LDL/HDL Ratio   Other Visit Diagnoses     Encounter for annual health examination    -  Primary   Relevant Orders   Comprehensive metabolic panel   Lipid Panel With LDL/HDL Ratio   Hemoglobin A1c   Vitamin B12   Low vitamin B12 level       Relevant Orders   Vitamin B12        Return in  about 6 months (around 09/11/2022) for chronic disease f/u.     I, ALavon Paganini MD, have reviewed all documentation for this visit. The documentation on 03/13/22 for the exam, diagnosis, procedures, and orders are all accurate and complete.   , ADionne Bucy MD, MPH [email or web address]

## 2022-03-06 NOTE — Progress Notes (Signed)
Virtual Visit via Video Note  I connected with Alexa Newman on 03/06/22 at 10:30 AM EST by a video enabled telemedicine application and verified that I am speaking with the correct person using two identifiers.  Location Provider Location : [facility] Patient Location : Home  Participants: Patient , Provider    I discussed the limitations of evaluation and management by telemedicine and the availability of in person appointments. The patient expressed understanding and agreed to proceed.   I discussed the assessment and treatment plan with the patient. The patient was provided an opportunity to ask questions and all were answered. The patient agreed with the plan and demonstrated an understanding of the instructions.   The patient was advised to call back or seek an in-person evaluation if the symptoms worsen or if the condition fails to improve as anticipated.    [place] MD OP Progress Note  03/06/2022 12:05 PM Alexa Newman  MRN:  [id number]  Chief Complaint:  Chief Complaint  Patient presents with   Follow-up   Medication Refill   Depression   Anxiety   HPI: Alexa Newman is a 61 year old Caucasian female, divorced, on SSI, lives in [place], has a history of bipolar disorder, borderline personality disorder, cocaine use disorder in remission, GAD, insomnia, tobacco use disorder was evaluated by telemedicine today.  Patient today reports she is currently recovering from COVID-19 infection.  She tested positive few days ago.  Her other family members are also sick with COVID-19.  She was provided Paxlovid by her primary care provider.  She does not have any severe symptoms except for headache.  Patient otherwise reports overall mood symptoms are stable.  She denies any significant depression, anxiety.  Reports sleep as good.  She is compliant on the Lamictal.  Denies side effects.  Uses the doxepin rarely.  Patient denies any suicidality, homicidality or perceptual  disturbances.  Reports she also has pelvic fractures however she does not have to do anything about it and was advised by her provider to 'just walk it out'.  She does have pain and is currently on Celebrex for the same.  That does help.  Patient denies any other concerns today.  Visit Diagnosis:    ICD-10-CM   1. Bipolar 1 disorder, mixed, full remission ([place])  F31.78     2. GAD (generalized anxiety disorder)  F41.1     3. Insomnia due to mental condition  F51.05    mood    4. Bereavement  Z63.4     5. Tobacco use disorder  F17.200     6. Hx of borderline personality disorder  Z86.59     7. Cocaine use disorder, moderate, in sustained remission ([place])  F14.21       Past Psychiatric History: Reviewed past psychiatric history from progress note on 07/27/2017.  Past Medical History:  Past Medical History:  Diagnosis Date   Anxiety    Arthritis    Bipolar 1 disorder ([place])    Depression    Family history of adverse reaction to anesthesia    half sister claims that she would stop breathing during surgery x 2   Hypertension    Osteoarthritis    PONV (postoperative nausea and vomiting)    Pre-diabetes     Past Surgical History:  Procedure Laterality Date   ANTERIOR CRUCIATE LIGAMENT (ACL) REVISION Right    CARPAL TUNNEL RELEASE     COLONOSCOPY     COLONOSCOPY WITH PROPOFOL N/A 11/17/2017   Procedure: COLONOSCOPY  WITH PROPOFOL;  Surgeon: Virgel Manifold, MD;  Location: [facility] ENDOSCOPY;  Service: Endoscopy;  Laterality: N/A;   DILATION AND CURETTAGE OF UTERUS  2013   for heavy bleeding   ENDOMETRIAL ABLATION  2013   KNEE ARTHROPLASTY Right 10/02/2020   Procedure: COMPUTER ASSISTED TOTAL KNEE ARTHROPLASTY;  Surgeon: Dereck Leep, MD;  Location: [facility] ORS;  Service: Orthopedics;  Laterality: Right;   right wrist surgery     TUBAL LIGATION     WRIST ARTHROSCOPY Left 1990   for degenerative changes    Family Psychiatric History: Reviewed family psychiatric history  from progress note on 07/27/2017.  Family History:  Family History  Problem Relation Age of Onset   Diabetes Mother    Depression Mother    Hypertension Mother    Crohn's disease Mother    Lung cancer Mother 19       former smoker   Congestive Heart Failure Father    Breast cancer Sister 73   Alcohol abuse Sister    Drug abuse Sister    Anxiety disorder Sister    Depression Sister    Bipolar disorder Sister    Alcohol abuse Brother    Drug abuse Brother    Anxiety disorder Brother    Depression Brother    Colon polyps Brother    HIV Brother    Alcohol abuse Brother    Drug abuse Brother    Anxiety disorder Brother    Depression Brother    Colon polyps Brother    HIV Brother    Skin cancer Maternal Grandfather    Colon cancer Neg Hx    Ovarian cancer Neg Hx    Cervical cancer Neg Hx     Social History: Reviewed social history from progress note on 07/27/2017. Social History   Socioeconomic History   Marital status: Divorced    Spouse name: Not on file   Number of children: 1   Years of education: Not on file   Highest education level: Some college, no degree  Occupational History   Occupation: disabled    Comment: on SSI awaiting decision about disability  Tobacco Use   Smoking status: Every Day    Packs/day: 0.50    Years: 45.00    Total pack years: 22.50    Types: Cigarettes   Smokeless tobacco: Never   Tobacco comments:    started smoking at age 46  Vaping Use   Vaping Use: Former  Substance and Sexual Activity   Alcohol use: Yes    Comment: 1 times per month   Drug use: Not Currently    Comment: former crack cocaine user   Sexual activity: Yes    Partners: Male    Birth control/protection: Post-menopausal, Surgical  Other Topics Concern   Not on file  Social History Narrative   Live with brother and his husband   Social Determinants of Health   Financial Resource Strain: High Risk (07/27/2017)   Overall Financial Resource Strain (CARDIA)     Difficulty of Paying Living Expenses: Hard  Food Insecurity: Food Insecurity Present (07/27/2017)   Hunger Vital Sign    Worried About Running Out of Food in the Last Year: Often true    Ran Out of Food in the Last Year: Often true  Transportation Needs: Unmet Transportation Needs (07/27/2017)   PRAPARE - Transportation    Lack of Transportation (Medical): Yes    Lack of Transportation (Non-Medical): Yes  Physical Activity: Insufficiently Active (07/27/2017)   Exercise Vital  Sign    Days of Exercise per Week: 4 days    Minutes of Exercise per Session: 20 min  Stress: Stress Concern Present (07/27/2017)   [place]    Feeling of Stress : Very much  Social Connections: Somewhat Isolated (07/27/2017)   Social Connection and Isolation Panel [NHANES]    Frequency of Communication with Friends and Family: More than three times a week    Frequency of Social Gatherings with Friends and Family: Once a week    Attends Religious Services: Never    Marine scientist or Organizations: No    Attends Archivist Meetings: Never    Marital Status: Married    Allergies:  Allergies  Allergen Reactions   Pregabalin Other (See Comments)    Other reaction(s): Other (See Comments) Suicidal ideation  Suicidal ideation    Metabolic Disorder Labs: Lab Results  Component Value Date   HGBA1C 5.8 (A) 06/02/2021   No results found for: "PROLACTIN" Lab Results  Component Value Date   CHOL 154 12/02/2020   TRIG 161 (H) 12/02/2020   HDL 59 12/02/2020   CHOLHDL 2.6 12/02/2020   LDLCALC 68 12/02/2020   LDLCALC 77 05/27/2020   Lab Results  Component Value Date   TSH 0.801 12/02/2020   TSH 0.983 11/27/2019    Therapeutic Level Labs: No results found for: "LITHIUM" No results found for: "VALPROATE" No results found for: "CBMZ"  Current Medications: Current Outpatient Medications  Medication Sig Dispense Refill    amLODipine (NORVASC) 10 MG tablet TAKE 1 TABLET BY MOUTH EVERY DAY 90 tablet 0   celecoxib (CELEBREX) 100 MG capsule Take 200 mg by mouth 2 (two) times daily.     doxepin (SINEQUAN) 10 MG capsule TAKE 1 CAPSULE (10 MG TOTAL) BY MOUTH AT BEDTIME AS NEEDED. FOR SLEEP 90 capsule 0   hydrOXYzine (ATARAX) 10 MG tablet TAKE 1 TABLET BY MOUTH 3 TIMES DAILY AS NEEDED FOR ANXIETY. 270 tablet 0   lamoTRIgine (LAMICTAL) 100 MG tablet TAKE 1 TABLET BY MOUTH TWICE A DAY 180 tablet 0   methocarbamol (ROBAXIN) 500 MG tablet Take 500 mg by mouth 2 (two) times daily.     nirmatrelvir/ritonavir (PAXLOVID) 20 x 150 MG & 10 x '100MG'$  TABS Take 3 tablets by mouth 2 (two) times daily for 5 days. (Take nirmatrelvir 150 mg two tablets twice daily for 5 days and ritonavir 100 mg one tablet twice daily for 5 days) Patient GFR is >60. 30 tablet 0   rosuvastatin (CRESTOR) 5 MG tablet TAKE 1 TABLET BY MOUTH EVERY OTHER DAY 45 tablet 3   triamterene-hydrochlorothiazide (MAXZIDE-25) 37.5-25 MG tablet TAKE 1 TABLET BY MOUTH EVERY DAY 90 tablet 0   vitamin B-12 (CYANOCOBALAMIN) 1000 MCG tablet Take 1,000 mcg by mouth daily.     meloxicam (MOBIC) 15 MG tablet TAKE 1 TABLET BY MOUTH EVERY DAY (Patient not taking: Reported on 03/06/2022) 30 tablet 2   nicotine (NICODERM CQ - DOSED IN MG/24 HOURS) 14 mg/24hr patch Place 1 patch (14 mg total) onto the skin daily. (Patient not taking: Reported on 11/24/2021) 28 patch 1   predniSONE (DELTASONE) 10 MG tablet Take '60mg'$  PO daily x1d, then '50mg'$  daily x1d, then '40mg'$  daily x1d, then '30mg'$  daily x1d, then '20mg'$  daily x1d, then '10mg'$  daily x1d, then stop (Patient not taking: Reported on 03/06/2022) 21 tablet 0   No current facility-administered medications for this visit.     Musculoskeletal: Strength &  Muscle Tone:  UTA Gait & Station:  Seated Patient leans: N/A  Psychiatric Specialty Exam: Review of Systems  Musculoskeletal:        Hip pain - s/p pelvic fracture  Neurological:  Positive for  headaches (Currently has COVID-19 infection).  All other systems reviewed and are negative.   Last menstrual period 12/07/2011.There is no height or weight on file to calculate BMI.  General Appearance: Casual  Eye Contact:  Fair  Speech:  Clear and Coherent  Volume:  Normal  Mood:  Euthymic  Affect:  Full Range  Thought Process:  Goal Directed and Descriptions of Associations: Intact  Orientation:  Full (Time, Place, and Person)  Thought Content: Logical   Suicidal Thoughts:  No  Homicidal Thoughts:  No  Memory:  Immediate;   Fair Recent;   Fair Remote;   Fair  Judgement:  Fair  Insight:  Fair  Psychomotor Activity:  Normal  Concentration:  Concentration: Fair and Attention Span: Fair  Recall:  [employer] of Knowledge: Fair  Language: Fair  Akathisia:  No  Handed:  Right  AIMS (if indicated): not done  Assets:  Communication Skills Desire for Improvement Housing Social Support Talents/Skills  ADL's:  Intact  Cognition: WNL  Sleep:  Fair   Screenings: AIMS    Flowsheet Row Video Visit from 10/16/2021 in [facility] Video Visit from 07/17/2021 in [facility] Total Score 0 0      GAD-7    Flowsheet Row Video Visit from 10/16/2021 in [facility] Video Visit from 07/17/2021 in [place] Video Visit from 06/19/2021 in [facility] Office Visit from 01/14/2021 in [facility] Video Visit from 01/22/2020 in [place]  Total GAD-7 Score '13 2 13 1 15      '$ PHQ2-9    Flowsheet Row Video Visit from 11/24/2021 in [place] Video Visit from 10/16/2021 in [place] Video Visit from 07/17/2021 in [facility] Video Visit from 06/19/2021 in [place] Video Visit  from 04/16/2021 in [place]  PHQ-2 Total Score 0 0 0 2 0  PHQ-9 Total Score -- -- -- 5 --      Flowsheet Row Video Visit from 03/06/2022 in [place] Video Visit from 11/24/2021 in [place] Video Visit from 10/16/2021 in [facility]        Assessment and Plan: Alexa Newman is a 61 year old Caucasian female, divorced, on SSI, lives in [place], has a history of bipolar disorder, borderline personality disorder, insomnia was evaluated by telemedicine today.  Patient is currently stable, recovering from COVID-19 infection.  Plan Bipolar disorder in remission Lamotrigine 100 mg p.o. twice daily  GAD-stable Hydroxyzine 10 mg p.o. twice daily as needed   Insomnia-stable Doxepin 10 mg p.o. nightly as needed.  Patient rarely uses it.  Patient to continue to follow-up with primary care provider for COVID-19 infection.  Patient also to follow-up with her provider for her pelvic fracture currently on Celebrex for pain management.  Follow-up in clinic in 3 months or sooner if needed.    Consent: Patient/Guardian gives verbal consent for treatment and assignment of benefits for services provided during this visit. Patient/Guardian expressed understanding and agreed to proceed.   This note was generated in part or whole with voice  recognition software. Voice recognition is usually quite accurate but there are transcription errors that can and very often do occur. I apologize for any typographical errors that were not detected and corrected.      Ursula Alert, MD 03/06/2022, 12:05 PM

## 2022-03-07 ENCOUNTER — Other Ambulatory Visit: Payer: Self-pay | Admitting: Family Medicine

## 2022-03-10 NOTE — Telephone Encounter (Signed)
Requested medications are due for refill today.  yes  Requested medications are on the active medications list.  yes  Last refill. 03/11/2021 #45 3 rf  Future visit scheduled.   yes  Notes to clinic.  Labs are expired.    Requested Prescriptions  Pending Prescriptions Disp Refills   rosuvastatin (CRESTOR) 5 MG tablet [Pharmacy Med Name: ROSUVASTATIN CALCIUM 5 MG TAB] 45 tablet 3    Sig: TAKE 1 TABLET BY MOUTH EVERY OTHER DAY     Cardiovascular:  Antilipid - Statins 2 Failed - 03/07/2022  5:58 PM      Failed - Lipid Panel in normal range within the last 12 months    Cholesterol, Total  Date Value Ref Range Status  12/02/2020 154 100 - 199 mg/dL Final   LDL Chol Calc (NIH)  Date Value Ref Range Status  12/02/2020 68 0 - 99 mg/dL Final   HDL  Date Value Ref Range Status  12/02/2020 59 >39 mg/dL Final   Triglycerides  Date Value Ref Range Status  12/02/2020 161 (H) 0 - 149 mg/dL Final         Passed - Cr in normal range and within 360 days    Creatinine, Ser  Date Value Ref Range Status  06/03/2021 0.82 0.57 - 1.00 mg/dL Final         Passed - Patient is not pregnant      Passed - Valid encounter within last 12 months    Recent Outpatient Visits           1 month ago Right hip pain   [facility] [place], Dionne Bucy, MD   9 months ago Essential hypertension   [place], Dionne Bucy, MD   1 year ago Encounter for annual physical exam   [facility] [place], Dionne Bucy, MD   1 year ago Left hip pain   [place], Vickki Muff, PA-C   1 year ago Essential hypertension   [place], Dionne Bucy, MD       Future Appointments             In 3 days [place], Dionne Bucy, MD [facility] [place]

## 2022-03-13 ENCOUNTER — Encounter: Payer: Self-pay | Admitting: Family Medicine

## 2022-03-13 ENCOUNTER — Ambulatory Visit (INDEPENDENT_AMBULATORY_CARE_PROVIDER_SITE_OTHER): Payer: Medicaid Other | Admitting: Family Medicine

## 2022-03-13 VITALS — BP 121/77 | HR 98 | Temp 98.5°F | Resp 16 | Ht 64.0 in | Wt 195.0 lb

## 2022-03-13 DIAGNOSIS — R7303 Prediabetes: Secondary | ICD-10-CM

## 2022-03-13 DIAGNOSIS — Z6833 Body mass index (BMI) 33.0-33.9, adult: Secondary | ICD-10-CM

## 2022-03-13 DIAGNOSIS — I10 Essential (primary) hypertension: Secondary | ICD-10-CM | POA: Diagnosis not present

## 2022-03-13 DIAGNOSIS — E669 Obesity, unspecified: Secondary | ICD-10-CM | POA: Diagnosis not present

## 2022-03-13 DIAGNOSIS — R7989 Other specified abnormal findings of blood chemistry: Secondary | ICD-10-CM

## 2022-03-13 DIAGNOSIS — F17200 Nicotine dependence, unspecified, uncomplicated: Secondary | ICD-10-CM

## 2022-03-13 DIAGNOSIS — E782 Mixed hyperlipidemia: Secondary | ICD-10-CM | POA: Diagnosis not present

## 2022-03-13 DIAGNOSIS — Z0000 Encounter for general adult medical examination without abnormal findings: Secondary | ICD-10-CM

## 2022-03-13 MED ORDER — NICOTINE 21 MG/24HR TD PT24: 21.0000 mg | MEDICATED_PATCH | Freq: Every day | TRANSDERMAL | 3 refills | Status: DC

## 2022-03-13 NOTE — Assessment & Plan Note (Signed)
Discussed importance of healthy weight management Discussed diet and exercise  

## 2022-03-13 NOTE — Assessment & Plan Note (Signed)
Previously well controlled Continue statin Repeat FLP and CMP  

## 2022-03-13 NOTE — Assessment & Plan Note (Signed)
Well controlled Continue current medications Recheck metabolic panel F/u in 6 months  

## 2022-03-13 NOTE — Assessment & Plan Note (Signed)
Ready to quit smoking Will Rx nicotine patch 21 mg daily.   Discussed stepping down on patches.

## 2022-03-13 NOTE — Assessment & Plan Note (Signed)
Recommend low carb diet °Recheck A1c  °

## 2022-03-23 DIAGNOSIS — M25851 Other specified joint disorders, right hip: Secondary | ICD-10-CM | POA: Diagnosis not present

## 2022-03-25 ENCOUNTER — Other Ambulatory Visit: Payer: Self-pay | Admitting: Family Medicine

## 2022-04-02 DIAGNOSIS — M25851 Other specified joint disorders, right hip: Secondary | ICD-10-CM | POA: Diagnosis not present

## 2022-04-07 DIAGNOSIS — M25851 Other specified joint disorders, right hip: Secondary | ICD-10-CM | POA: Diagnosis not present

## 2022-04-09 DIAGNOSIS — M25851 Other specified joint disorders, right hip: Secondary | ICD-10-CM | POA: Diagnosis not present

## 2022-04-14 DIAGNOSIS — M25851 Other specified joint disorders, right hip: Secondary | ICD-10-CM | POA: Diagnosis not present

## 2022-04-19 ENCOUNTER — Other Ambulatory Visit: Payer: Self-pay | Admitting: Physician Assistant

## 2022-04-19 ENCOUNTER — Other Ambulatory Visit: Payer: Self-pay | Admitting: Psychiatry

## 2022-04-19 DIAGNOSIS — F411 Generalized anxiety disorder: Secondary | ICD-10-CM

## 2022-04-20 DIAGNOSIS — M5441 Lumbago with sciatica, right side: Secondary | ICD-10-CM | POA: Diagnosis not present

## 2022-04-20 DIAGNOSIS — M48062 Spinal stenosis, lumbar region with neurogenic claudication: Secondary | ICD-10-CM | POA: Diagnosis not present

## 2022-04-20 DIAGNOSIS — M5442 Lumbago with sciatica, left side: Secondary | ICD-10-CM | POA: Diagnosis not present

## 2022-04-27 IMAGING — CR DG LUMBAR SPINE COMPLETE 4+V
1 series · 7 of 7 positions shown · non-contrast
Comparison: X-ray 07/13/2017.

CLINICAL DATA: Chronic low back pain without known injury.

EXAM:
LUMBAR SPINE - COMPLETE 4+ VIEW

[Series 1: dg lumbar spine complete 4 +v · 0.14mm/px · 7 of 7 slices shown]
[im 1/7]
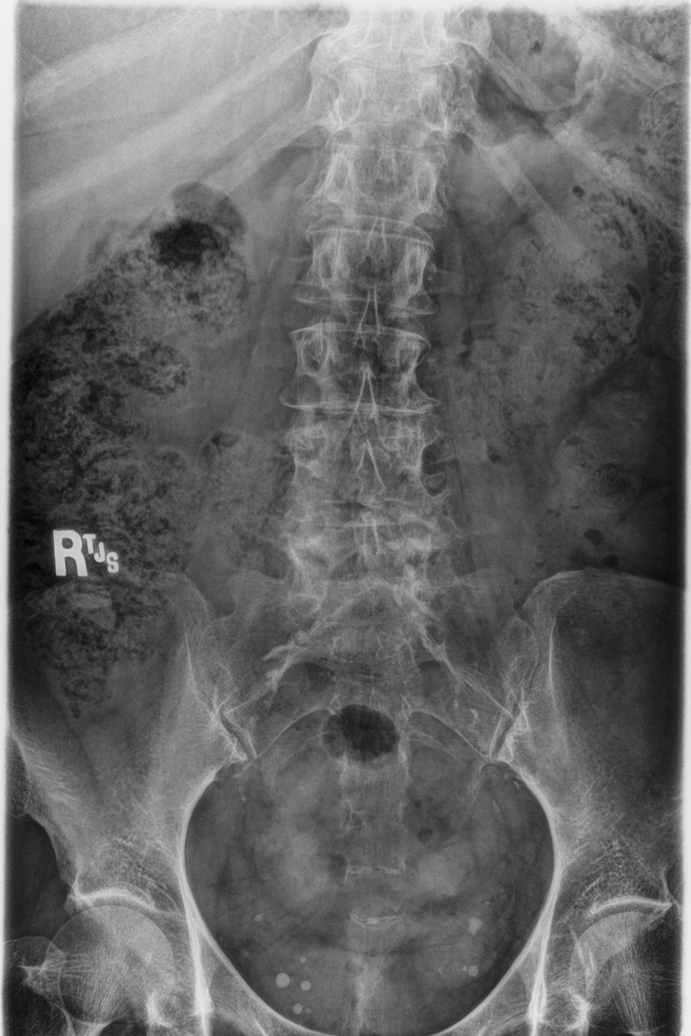
[im 2/7]
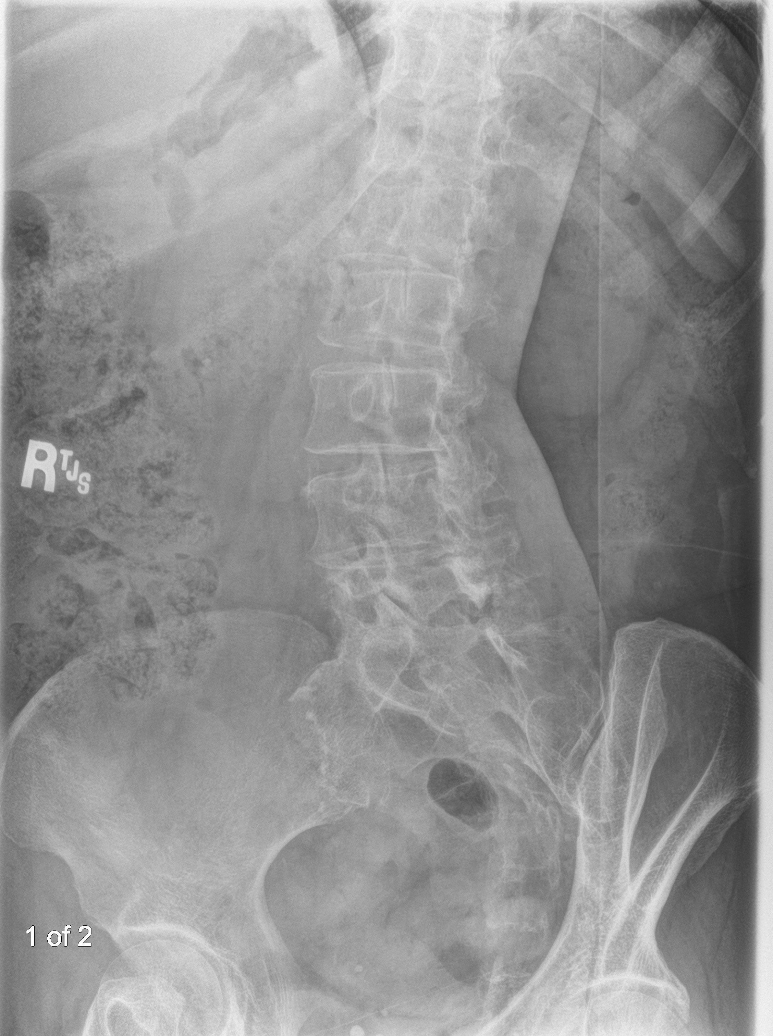
[im 3/7]
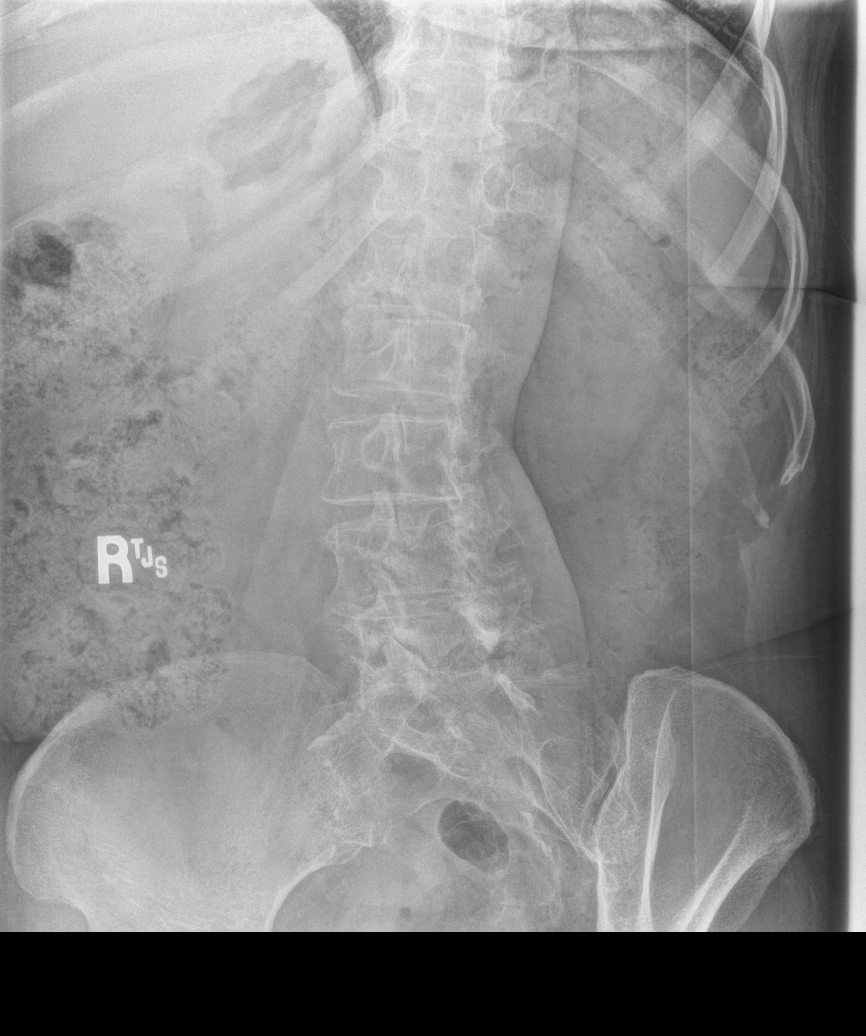
[im 4/7]
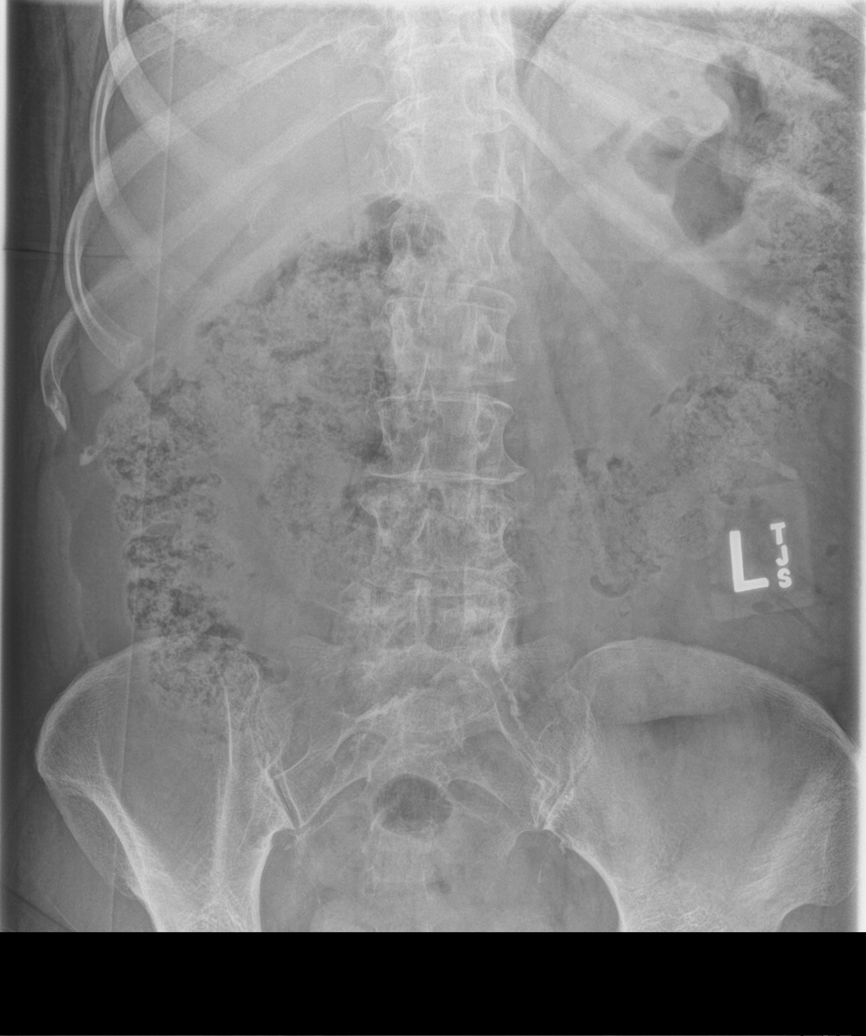
[im 5/7]
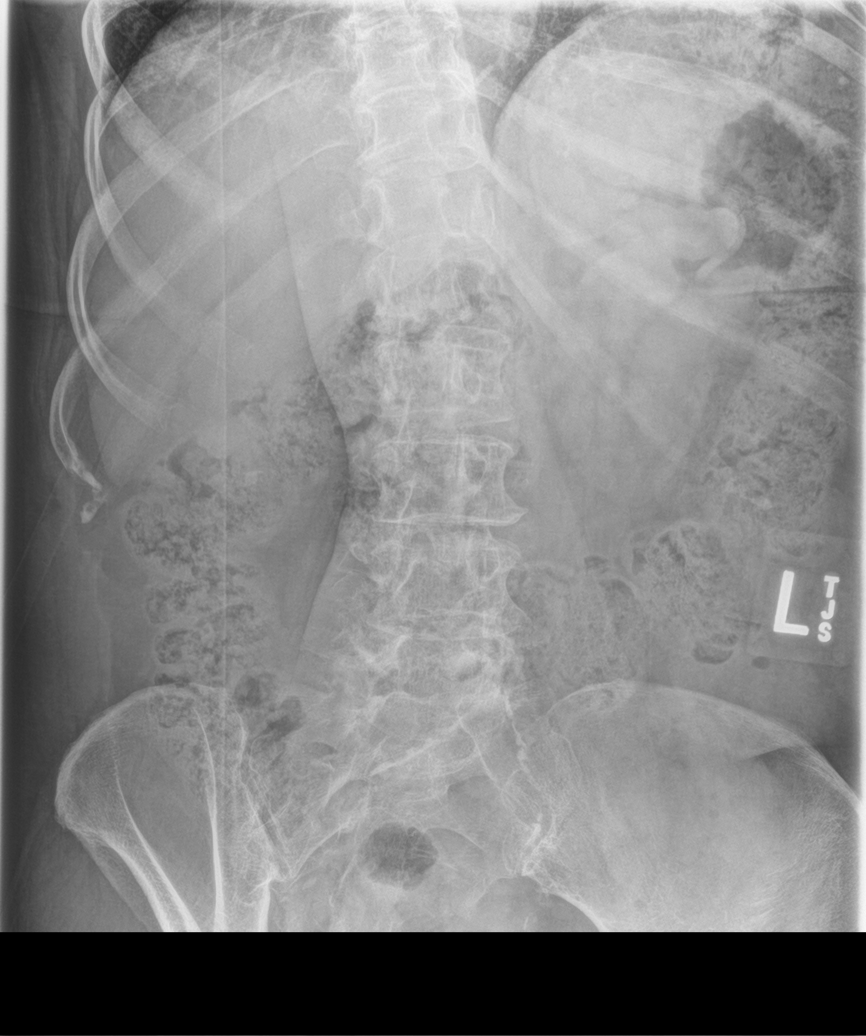
[im 6/7]
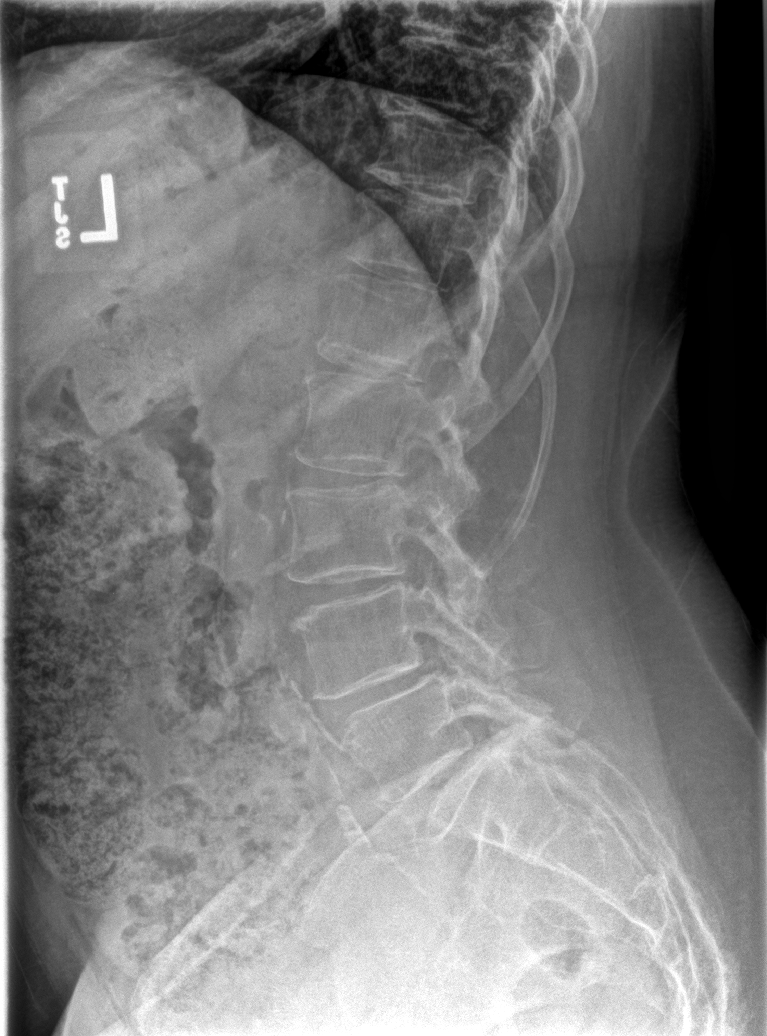
[im 7/7]
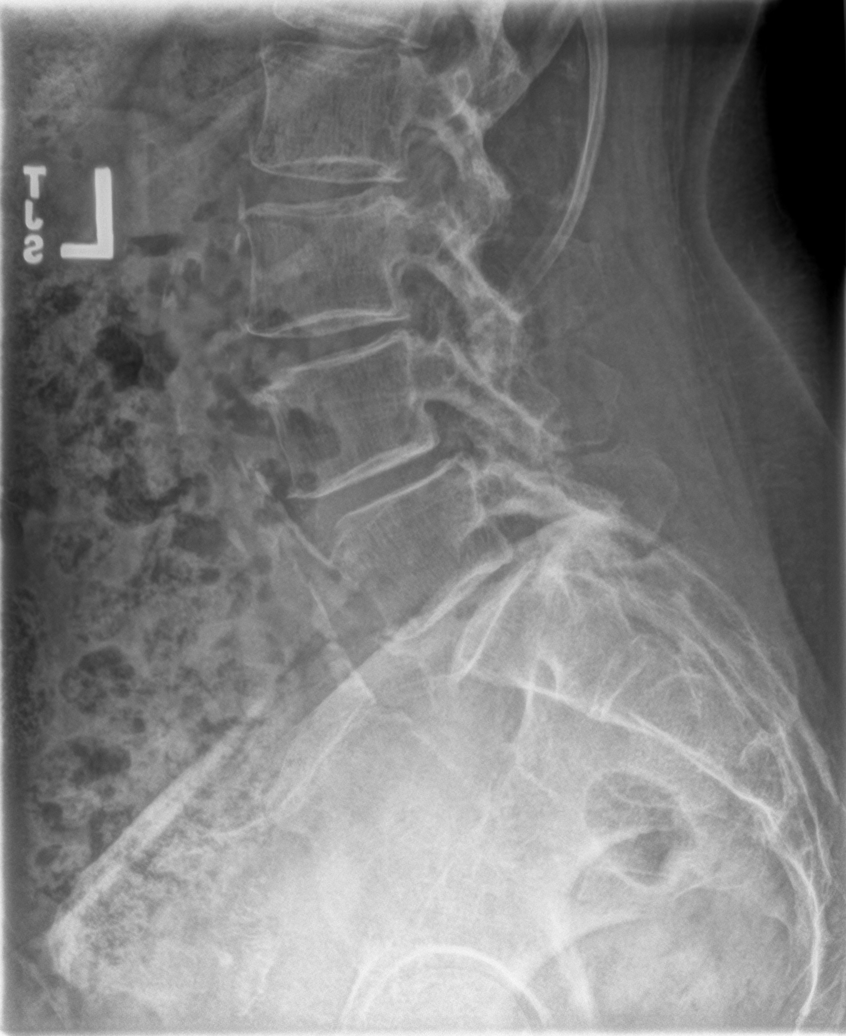

[7 of 7 positions shown; findings below may reference images not displayed]

FINDINGS: Minimal grade 1 anterolisthesis of L4-5 is noted secondary to
posterior facet joint hypertrophy. Mild degenerative disc disease is
noted at L3-4. No fracture is noted.
IMPRESSION: Mild multilevel degenerative changes are noted. No acute abnormality
seen.

## 2022-05-03 ENCOUNTER — Other Ambulatory Visit: Payer: Self-pay | Admitting: Family Medicine

## 2022-05-05 DIAGNOSIS — G8929 Other chronic pain: Secondary | ICD-10-CM | POA: Diagnosis not present

## 2022-05-05 DIAGNOSIS — M48062 Spinal stenosis, lumbar region with neurogenic claudication: Secondary | ICD-10-CM | POA: Diagnosis not present

## 2022-05-05 DIAGNOSIS — M5441 Lumbago with sciatica, right side: Secondary | ICD-10-CM | POA: Diagnosis not present

## 2022-05-05 DIAGNOSIS — M5442 Lumbago with sciatica, left side: Secondary | ICD-10-CM | POA: Diagnosis not present

## 2022-05-28 ENCOUNTER — Telehealth (INDEPENDENT_AMBULATORY_CARE_PROVIDER_SITE_OTHER): Payer: Medicaid Other | Admitting: Psychiatry

## 2022-05-28 ENCOUNTER — Encounter: Payer: Self-pay | Admitting: Psychiatry

## 2022-05-28 DIAGNOSIS — F17210 Nicotine dependence, cigarettes, uncomplicated: Secondary | ICD-10-CM

## 2022-05-28 DIAGNOSIS — F411 Generalized anxiety disorder: Secondary | ICD-10-CM | POA: Diagnosis not present

## 2022-05-28 DIAGNOSIS — F3178 Bipolar disorder, in full remission, most recent episode mixed: Secondary | ICD-10-CM

## 2022-05-28 DIAGNOSIS — F17200 Nicotine dependence, unspecified, uncomplicated: Secondary | ICD-10-CM

## 2022-05-28 DIAGNOSIS — F5105 Insomnia due to other mental disorder: Secondary | ICD-10-CM

## 2022-05-28 DIAGNOSIS — Z8659 Personal history of other mental and behavioral disorders: Secondary | ICD-10-CM

## 2022-05-28 DIAGNOSIS — F1421 Cocaine dependence, in remission: Secondary | ICD-10-CM

## 2022-05-28 NOTE — Progress Notes (Signed)
Virtual Visit via Video Note  I connected with Alexa Newman on 05/28/22 at 11:30 AM EDT by a video enabled telemedicine application and verified that I am speaking with the correct person using two identifiers.  Location Provider Location : [facility] Patient Location : Car  Participants: Patient , Provider   I discussed the limitations of evaluation and management by telemedicine and the availability of in person appointments. The patient expressed understanding and agreed to proceed.   I discussed the assessment and treatment plan with the patient. The patient was provided an opportunity to ask questions and all were answered. The patient agreed with the plan and demonstrated an understanding of the instructions.   The patient was advised to call back or seek an in-person evaluation if the symptoms worsen or if the condition fails to improve as anticipated.  [place] MD OP Progress Note  05/28/2022 2:01 PM IRAIDA SCANLIN  MRN:  [id number]  Chief Complaint:  Chief Complaint  Patient presents with   Follow-up   Anxiety   Depression   Medication Refill   HPI: Alexa Newman is a 62 year old Caucasian female, divorced, on SSI, lives in [place], has a history of bipolar disorder, borderline personality disorder, cocaine use disorder, GAD, insomnia, tobacco use disorder was evaluated by telemedicine today.  Patient today reports she is currently doing well with regards to her mood.  She had COVID-19 infection in late December.  She reports she only had mild symptoms however continues to have a constant headache as well as fatigue.  She however reports it is getting better.  She is following up with her primary care provider.  She is currently awaiting to get labs ordered by her primary care completed.  Patient reports sleep is overall good.  Uses the doxepin only as needed.  Patient denies any significant mood swings, mania or depression symptoms.  Currently compliant on the  Lamictal.  Currently cutting back on the smoking, smokes 1 cigarette a few times a week only.  Patient denies any suicidality, homicidality or perceptual disturbances.  Patient denies any other concerns today.  Visit Diagnosis:    ICD-10-CM   1. Bipolar 1 disorder, mixed, full remission ([place])  F31.78     2. GAD (generalized anxiety disorder)  F41.1     3. Insomnia due to mental condition  F51.05    mood    4. Tobacco use disorder  F17.200     5. Hx of borderline personality disorder  Z86.59     6. Cocaine use disorder, moderate, in sustained remission ([place])  F14.21       Past Psychiatric History: I have reviewed past psychiatric history from progress note on 07/27/2017  Past Medical History:  Past Medical History:  Diagnosis Date   Anxiety    Arthritis    Bipolar 1 disorder ([place])    Depression    Family history of adverse reaction to anesthesia    half sister claims that she would stop breathing during surgery x 2   Hypertension    Osteoarthritis    PONV (postoperative nausea and vomiting)    Pre-diabetes     Past Surgical History:  Procedure Laterality Date   ANTERIOR CRUCIATE LIGAMENT (ACL) REVISION Right    CARPAL TUNNEL RELEASE     COLONOSCOPY     COLONOSCOPY WITH PROPOFOL N/A 11/17/2017   Procedure: COLONOSCOPY WITH PROPOFOL;  Surgeon: Virgel Manifold, MD;  Location: [facility] ENDOSCOPY;  Service: Endoscopy;  Laterality: N/A;   DILATION AND  CURETTAGE OF UTERUS  2013   for heavy bleeding   ENDOMETRIAL ABLATION  2013   KNEE ARTHROPLASTY Right 10/02/2020   Procedure: COMPUTER ASSISTED TOTAL KNEE ARTHROPLASTY;  Surgeon: Dereck Leep, MD;  Location: [facility] ORS;  Service: Orthopedics;  Laterality: Right;   right wrist surgery     TUBAL LIGATION     WRIST ARTHROSCOPY Left 1990   for degenerative changes    Family Psychiatric History: I have reviewed family psychiatric history from progress note on 07/27/2017.  Family History:  Family History  Problem  Relation Age of Onset   Diabetes Mother    Depression Mother    Hypertension Mother    Crohn's disease Mother    Lung cancer Mother 86       former smoker   Congestive Heart Failure Father    Breast cancer Sister 35   Alcohol abuse Sister    Drug abuse Sister    Anxiety disorder Sister    Depression Sister    Bipolar disorder Sister    Alcohol abuse Brother    Drug abuse Brother    Anxiety disorder Brother    Depression Brother    Colon polyps Brother    HIV Brother    Alcohol abuse Brother    Drug abuse Brother    Anxiety disorder Brother    Depression Brother    Colon polyps Brother    HIV Brother    Skin cancer Maternal Grandfather    Colon cancer Neg Hx    Ovarian cancer Neg Hx    Cervical cancer Neg Hx     Social History: I have reviewed social history from progress note on 07/27/2017. Social History   Socioeconomic History   Marital status: Divorced    Spouse name: Not on file   Number of children: 1   Years of education: Not on file   Highest education level: Some college, no degree  Occupational History   Occupation: disabled    Comment: on SSI awaiting decision about disability  Tobacco Use   Smoking status: Some Days    Types: Cigarettes   Smokeless tobacco: Never   Tobacco comments:    Currently smokes 1 cig every few days  Vaping Use   Vaping Use: Former  Substance and Sexual Activity   Alcohol use: Yes    Comment: 1 times per month   Drug use: Not Currently    Comment: former crack cocaine user   Sexual activity: Yes    Partners: Male    Birth control/protection: Post-menopausal, Surgical  Other Topics Concern   Not on file  Social History Narrative   Live with brother and his husband   Social Determinants of Health   Financial Resource Strain: High Risk (07/27/2017)   Overall Financial Resource Strain (CARDIA)    Difficulty of Paying Living Expenses: Hard  Food Insecurity: Food Insecurity Present (07/27/2017)   Hunger Vital Sign     Worried About Running Out of Food in the Last Year: Often true    Ran Out of Food in the Last Year: Often true  Transportation Needs: Unmet Transportation Needs (07/27/2017)   PRAPARE - Hydrologist (Medical): Yes    Lack of Transportation (Non-Medical): Yes  Physical Activity: Insufficiently Active (07/27/2017)   Exercise Vital Sign    Days of Exercise per Week: 4 days    Minutes of Exercise per Session: 20 min  Stress: Stress Concern Present (07/27/2017)   [employer] Occupational  Health - Occupational Stress Questionnaire    Feeling of Stress : Very much  Social Connections: Somewhat Isolated (07/27/2017)   Social Connection and Isolation Panel [NHANES]    Frequency of Communication with Friends and Family: More than three times a week    Frequency of Social Gatherings with Friends and Family: Once a week    Attends Religious Services: Never    Marine scientist or Organizations: No    Attends Archivist Meetings: Never    Marital Status: Married    Allergies:  Allergies  Allergen Reactions   Pregabalin Other (See Comments)    Other reaction(s): Other (See Comments) Suicidal ideation  Suicidal ideation    Metabolic Disorder Labs: Lab Results  Component Value Date   HGBA1C 5.8 (A) 06/02/2021   No results found for: "PROLACTIN" Lab Results  Component Value Date   CHOL 154 12/02/2020   TRIG 161 (H) 12/02/2020   HDL 59 12/02/2020   CHOLHDL 2.6 12/02/2020   LDLCALC 68 12/02/2020   LDLCALC 77 05/27/2020   Lab Results  Component Value Date   TSH 0.801 12/02/2020   TSH 0.983 11/27/2019    Therapeutic Level Labs: No results found for: "LITHIUM" No results found for: "VALPROATE" No results found for: "CBMZ"  Current Medications: Current Outpatient Medications  Medication Sig Dispense Refill   amLODipine (NORVASC) 10 MG tablet TAKE 1 TABLET BY MOUTH EVERY DAY 90 tablet 0   celecoxib (CELEBREX) 100 MG capsule Take  200 mg by mouth 2 (two) times daily.     doxepin (SINEQUAN) 10 MG capsule TAKE 1 CAPSULE (10 MG TOTAL) BY MOUTH AT BEDTIME AS NEEDED. FOR SLEEP 90 capsule 0   hydrOXYzine (ATARAX) 10 MG tablet TAKE 1 TABLET BY MOUTH 3 TIMES DAILY AS NEEDED FOR ANXIETY. 270 tablet 0   lamoTRIgine (LAMICTAL) 100 MG tablet TAKE 1 TABLET BY MOUTH TWICE A DAY 180 tablet 0   methocarbamol (ROBAXIN) 500 MG tablet Take 500 mg by mouth 2 (two) times daily.     nicotine (NICODERM CQ - DOSED IN MG/24 HOURS) 21 mg/24hr patch Place 1 patch (21 mg total) onto the skin daily. 28 patch 3   rosuvastatin (CRESTOR) 5 MG tablet TAKE 1 TABLET BY MOUTH EVERY OTHER DAY 45 tablet 0   triamterene-hydrochlorothiazide (MAXZIDE-25) 37.5-25 MG tablet TAKE 1 TABLET BY MOUTH EVERY DAY 90 tablet 0   UNABLE TO FIND Take 1 tablet by mouth in the morning and at bedtime. Med Name: calcium, magnesium and zinc     vitamin B-12 (CYANOCOBALAMIN) 1000 MCG tablet Take 1,000 mcg by mouth daily.     No current facility-administered medications for this visit.     Musculoskeletal: Strength & Muscle Tone:  UTA Gait & Station:  Seated Patient leans: N/A  Psychiatric Specialty Exam: Review of Systems  Constitutional:  Positive for fatigue.  Neurological:  Positive for headaches.  Psychiatric/Behavioral: Negative.    All other systems reviewed and are negative.   Last menstrual period 12/07/2011.There is no height or weight on file to calculate BMI.  General Appearance: Casual  Eye Contact:  Good  Speech:  Clear and Coherent  Volume:  Normal  Mood:  Euthymic  Affect:  Congruent  Thought Process:  Goal Directed and Descriptions of Associations: Intact  Orientation:  Full (Time, Place, and Person)  Thought Content: Logical   Suicidal Thoughts:  No  Homicidal Thoughts:  No  Memory:  Immediate;   Fair Recent;   Fair Remote;  Fair  Judgement:  Fair  Insight:  Fair  Psychomotor Activity:  Normal  Concentration:  Concentration: Fair and  Attention Span: Fair  Recall:  [employer] of Knowledge: Fair  Language: Fair  Akathisia:  No  Handed:  Right  AIMS (if indicated): not done  Assets:  Communication Skills Desire for Improvement Housing Social Support  ADL's:  Intact  Cognition: WNL  Sleep:  Fair   Screenings: AIMS    Flowsheet Row Video Visit from 10/16/2021 in [place] Video Visit from 07/17/2021 in [place] Total Score 0 0      GAD-7    Flowsheet Row Video Visit from 10/16/2021 in [place] Video Visit from 07/17/2021 in [place] Video Visit from 06/19/2021 in [place] Office Visit from 01/14/2021 in [place] Video Visit from 01/22/2020 in [place]  Total GAD-7 Score 13 2 13 1 15       PHQ2-9    Flowsheet Row Video Visit from 05/28/2022 in [place] Office Visit from 03/13/2022 in [place] Video Visit from 11/24/2021 in [place] Video Visit from 10/16/2021 in [place] Video Visit from 07/17/2021 in [place]  PHQ-2 Total Score 0 1 0 0 0  PHQ-9 Total Score -- 3 -- -- --      Flowsheet Row Video Visit from 05/28/2022 in [place] Video Visit from 03/06/2022 in [place] Video Visit from 11/24/2021 in [place]  C-SSRS RISK CATEGORY Low Risk Low Risk Low Risk        Assessment and Plan: Alexa Newman is a 62 year old Caucasian female, divorced, SSI, lives in [place], has a history  of bipolar disorder, borderline personality disorder, insomnia was evaluated by telemedicine today.  Patient is currently stable.  Plan as noted below.  Plan Bipolar disorder in remission Lamotrigine 100 mg p.o. twice daily  GAD-stable Hydroxyzine 10 mg p.o. twice daily as needed  Insomnia-stable Doxepin 10 mg p.o. nightly as needed  Tobacco use disorder-improving Patient is currently cutting back.  Provided counseling for 1 minute. Patient is currently on nicotine replacement therapy.   Follow-up in clinic in 4 months or sooner in person. Collaboration of Care: Collaboration of Care: Other encouraged to get labs done including TSH.  Patient to go to primary care provider.  Patient also advised to follow up with primary care for fatigue and headache.  Patient/Guardian was advised Release of Information must be obtained prior to any record release in order to collaborate their care with an outside provider. Patient/Guardian was advised if they have not already done so to contact the registration department to sign all necessary forms in order for [place] to release information regarding their care.   Consent: Patient/Guardian gives verbal consent for treatment and assignment of benefits for services provided during this visit. Patient/Guardian expressed understanding and agreed to proceed.   This note was generated in part or whole with voice recognition software. Voice recognition is usually quite accurate but there are transcription errors that can and very often do occur. I apologize for any typographical errors that were not detected and corrected.    Ursula Alert, MD 05/28/2022, 2:01  PM

## 2022-05-29 ENCOUNTER — Telehealth: Payer: Medicaid Other | Admitting: Psychiatry

## 2022-06-03 ENCOUNTER — Other Ambulatory Visit: Payer: Self-pay | Admitting: Psychiatry

## 2022-06-03 ENCOUNTER — Other Ambulatory Visit: Payer: Self-pay | Admitting: Family Medicine

## 2022-06-03 DIAGNOSIS — F411 Generalized anxiety disorder: Secondary | ICD-10-CM

## 2022-06-30 ENCOUNTER — Other Ambulatory Visit: Payer: Self-pay | Admitting: Acute Care

## 2022-06-30 DIAGNOSIS — Z122 Encounter for screening for malignant neoplasm of respiratory organs: Secondary | ICD-10-CM

## 2022-06-30 DIAGNOSIS — Z87891 Personal history of nicotine dependence: Secondary | ICD-10-CM

## 2022-06-30 DIAGNOSIS — F17210 Nicotine dependence, cigarettes, uncomplicated: Secondary | ICD-10-CM

## 2022-07-04 ENCOUNTER — Other Ambulatory Visit: Payer: Self-pay | Admitting: Family Medicine

## 2022-07-08 ENCOUNTER — Other Ambulatory Visit: Payer: Self-pay | Admitting: Psychiatry

## 2022-07-08 DIAGNOSIS — F411 Generalized anxiety disorder: Secondary | ICD-10-CM

## 2022-07-20 ENCOUNTER — Ambulatory Visit
Admission: RE | Admit: 2022-07-20 | Discharge: 2022-07-20 | Disposition: A | Discharge status: Home / Self Care | Payer: Medicaid Other | Source: Ambulatory Visit | Attending: Family Medicine | Admitting: Family Medicine

## 2022-07-20 DIAGNOSIS — Z122 Encounter for screening for malignant neoplasm of respiratory organs: Secondary | ICD-10-CM | POA: Insufficient documentation

## 2022-07-20 DIAGNOSIS — F17210 Nicotine dependence, cigarettes, uncomplicated: Secondary | ICD-10-CM | POA: Insufficient documentation

## 2022-07-20 DIAGNOSIS — Z87891 Personal history of nicotine dependence: Secondary | ICD-10-CM | POA: Insufficient documentation

## 2022-07-24 ENCOUNTER — Other Ambulatory Visit: Payer: Self-pay | Admitting: Family Medicine

## 2022-07-24 ENCOUNTER — Other Ambulatory Visit: Payer: Self-pay | Admitting: Acute Care

## 2022-07-24 DIAGNOSIS — F17210 Nicotine dependence, cigarettes, uncomplicated: Secondary | ICD-10-CM

## 2022-07-24 DIAGNOSIS — Z87891 Personal history of nicotine dependence: Secondary | ICD-10-CM

## 2022-07-24 DIAGNOSIS — Z1231 Encounter for screening mammogram for malignant neoplasm of breast: Secondary | ICD-10-CM

## 2022-07-24 DIAGNOSIS — Z122 Encounter for screening for malignant neoplasm of respiratory organs: Secondary | ICD-10-CM

## 2022-07-25 ENCOUNTER — Other Ambulatory Visit: Payer: Self-pay | Admitting: Family Medicine

## 2022-07-27 NOTE — Telephone Encounter (Signed)
Requested medications are due for refill today.  Yes  Requested medications are on the active medications list.  yes  Last refill. 05/04/2022 #45 0 rf  Future visit scheduled.   no  Notes to clinic.  Labs are expired.    Requested Prescriptions  Pending Prescriptions Disp Refills   rosuvastatin (CRESTOR) 5 MG tablet [Pharmacy Med Name: ROSUVASTATIN CALCIUM 5 MG TAB] 45 tablet 0    Sig: TAKE 1 TABLET BY MOUTH EVERY OTHER DAY     Cardiovascular:  Antilipid - Statins 2 Failed - 07/25/2022 11:35 AM      Failed - Cr in normal range and within 360 days    Creatinine, Ser  Date Value Ref Range Status  06/03/2021 0.82 0.57 - 1.00 mg/dL Final         Failed - Lipid Panel in normal range within the last 12 months    Cholesterol, Total  Date Value Ref Range Status  12/02/2020 154 100 - 199 mg/dL Final   LDL Chol Calc (NIH)  Date Value Ref Range Status  12/02/2020 68 0 - 99 mg/dL Final   HDL  Date Value Ref Range Status  12/02/2020 59 >39 mg/dL Final   Triglycerides  Date Value Ref Range Status  12/02/2020 161 (H) 0 - 149 mg/dL Final         Passed - Patient is not pregnant      Passed - Valid encounter within last 12 months    Recent Outpatient Visits           4 months ago Encounter for annual health examination   [place] [facility] [place], Marzella Schlein, MD   6 months ago Right hip pain   [place] [place], Marzella Schlein, MD   1 year ago Essential hypertension   [place] [facility] [place], Marzella Schlein, MD   1 year ago Encounter for annual physical exam   [place] [facility] [place], Marzella Schlein, MD   2 years ago Left hip pain   [facility] [place], Jodell [place], [place]

## 2022-07-30 DIAGNOSIS — M5416 Radiculopathy, lumbar region: Secondary | ICD-10-CM | POA: Diagnosis not present

## 2022-08-11 ENCOUNTER — Ambulatory Visit
Admission: RE | Admit: 2022-08-11 | Discharge: 2022-08-11 | Disposition: A | Discharge status: Home / Self Care | Payer: Medicaid Other | Source: Ambulatory Visit | Attending: Family Medicine | Admitting: Family Medicine

## 2022-08-11 DIAGNOSIS — Z1231 Encounter for screening mammogram for malignant neoplasm of breast: Secondary | ICD-10-CM | POA: Diagnosis not present

## 2022-08-18 DIAGNOSIS — M5442 Lumbago with sciatica, left side: Secondary | ICD-10-CM | POA: Diagnosis not present

## 2022-08-18 DIAGNOSIS — M5416 Radiculopathy, lumbar region: Secondary | ICD-10-CM | POA: Diagnosis not present

## 2022-08-18 DIAGNOSIS — G8929 Other chronic pain: Secondary | ICD-10-CM | POA: Diagnosis not present

## 2022-08-18 DIAGNOSIS — M5441 Lumbago with sciatica, right side: Secondary | ICD-10-CM | POA: Diagnosis not present

## 2022-08-25 ENCOUNTER — Telehealth: Payer: Self-pay

## 2022-08-25 NOTE — Telephone Encounter (Signed)
Pt left vmm to schedule colonoscopy please return call 

## 2022-08-25 NOTE — Telephone Encounter (Signed)
Message left for patient to return my call.  

## 2022-08-27 ENCOUNTER — Telehealth: Payer: Self-pay | Admitting: *Deleted

## 2022-08-27 ENCOUNTER — Other Ambulatory Visit: Payer: Self-pay | Admitting: *Deleted

## 2022-08-27 DIAGNOSIS — Z86010 Personal history of colonic polyps: Secondary | ICD-10-CM

## 2022-08-27 MED ORDER — NA SULFATE-K SULFATE-MG SULF 17.5-3.13-1.6 GM/177ML PO SOLN: 1.0000 | Freq: Once | ORAL | 0 refills | Status: AC

## 2022-08-27 NOTE — Telephone Encounter (Signed)
Colonoscopy schedule on 11/24/2022 with Dr Tobi Bastos

## 2022-08-27 NOTE — Telephone Encounter (Signed)
Gastroenterology Pre-Procedure Review  Request Date: 11/24/2022 Requesting Physician: Dr. Tobi Bastos  PATIENT REVIEW QUESTIONS: The patient responded to the following health history questions as indicated:    1. Are you having any GI issues? no 2. Do you have a personal history of Polyps? yes (11/24/2022) 3. Do you have a family history of Colon Cancer or Polyps? no 4. Diabetes Mellitus? no 5. Joint replacements in the past 12 months?no 6. Major health problems in the past 3 months?no 7. Any artificial heart valves, MVP, or defibrillator?no    MEDICATIONS & ALLERGIES:    Patient reports the following regarding taking any anticoagulation/antiplatelet therapy:   Plavix, Coumadin, Eliquis, Xarelto, Lovenox, Pradaxa, Brilinta, or Effient? no Aspirin? no  Patient confirms/reports the following medications:  Current Outpatient Medications  Medication Sig Dispense Refill   amLODipine (NORVASC) 10 MG tablet TAKE 1 TABLET BY MOUTH EVERY DAY 90 tablet 0   celecoxib (CELEBREX) 100 MG capsule Take 200 mg by mouth 2 (two) times daily.     doxepin (SINEQUAN) 10 MG capsule TAKE 1 CAPSULE (10 MG TOTAL) BY MOUTH AT BEDTIME AS NEEDED. FOR SLEEP 90 capsule 0   hydrOXYzine (ATARAX) 10 MG tablet TAKE 1 TABLET BY MOUTH 3 TIMES DAILY AS NEEDED FOR ANXIETY. 270 tablet 0   lamoTRIgine (LAMICTAL) 100 MG tablet TAKE 1 TABLET BY MOUTH TWICE A DAY 180 tablet 0   methocarbamol (ROBAXIN) 500 MG tablet Take 500 mg by mouth 2 (two) times daily.     nicotine (NICODERM CQ - DOSED IN MG/24 HOURS) 21 mg/24hr patch PLACE 1 PATCH ONTO THE SKIN DAILY. 28 patch 3   rosuvastatin (CRESTOR) 5 MG tablet TAKE 1 TABLET BY MOUTH EVERY OTHER DAY 15 tablet 0   triamterene-hydrochlorothiazide (MAXZIDE-25) 37.5-25 MG tablet TAKE 1 TABLET BY MOUTH EVERY DAY 90 tablet 0   UNABLE TO FIND Take 1 tablet by mouth in the morning and at bedtime. Med Name: calcium, magnesium and zinc     vitamin B-12 (CYANOCOBALAMIN) 1000 MCG tablet Take 1,000 mcg by  mouth daily.     No current facility-administered medications for this visit.    Patient confirms/reports the following allergies:  Allergies  Allergen Reactions   Pregabalin Other (See Comments)    Other reaction(s): Other (See Comments) Suicidal ideation  Suicidal ideation    No orders of the defined types were placed in this encounter.   AUTHORIZATION INFORMATION Primary Insurance: 1D#: Group #:  Secondary Insurance: 1D#: Group #:  SCHEDULE INFORMATION: Date: 11/24/2022 Time: Location:  [facility]

## 2022-09-04 NOTE — Telephone Encounter (Signed)
error 

## 2022-09-11 ENCOUNTER — Other Ambulatory Visit: Payer: Self-pay | Admitting: Family Medicine

## 2022-09-11 NOTE — Telephone Encounter (Signed)
Requested medication (s) are due for refill today: yes  Requested medication (s) are on the active medication list: yes  Last refill:  06/03/22#90/0  Future visit scheduled: no  Notes to clinic:  Unable to refill per protocol due to failed labs, no updated results.      Requested Prescriptions  Pending Prescriptions Disp Refills   triamterene-hydrochlorothiazide (MAXZIDE-25) 37.5-25 MG tablet [Pharmacy Med Name: TRIAMTERENE-HCTZ 37.5-25 MG TB] 90 tablet 0    Sig: TAKE 1 TABLET BY MOUTH EVERY DAY     Cardiovascular: Diuretic Combos Failed - 09/11/2022  2:53 PM      Failed - K in normal range and within 180 days    Potassium  Date Value Ref Range Status  06/03/2021 3.7 3.5 - 5.2 mmol/L Final         Failed - Na in normal range and within 180 days    Sodium  Date Value Ref Range Status  06/03/2021 140 134 - 144 mmol/L Final         Failed - Cr in normal range and within 180 days    Creatinine, Ser  Date Value Ref Range Status  06/03/2021 0.82 0.57 - 1.00 mg/dL Final         Failed - Valid encounter within last 6 months    Recent Outpatient Visits           6 months ago Encounter for annual health examination   [facility] [place], Marzella Schlein, MD   7 months ago Right hip pain   [place] [facility] [place], Marzella Schlein, MD   1 year ago Essential hypertension   [place] [facility] [place], Marzella Schlein, MD   1 year ago Encounter for annual physical exam   [place] [facility] Erasmo Downer, MD   2 years ago Left hip pain   [place] [facility] [place], Jodell [place], [place]              Passed - Last BP in normal range    BP Readings from Last 1 Encounters:  03/13/22 121/77

## 2022-09-11 NOTE — Telephone Encounter (Signed)
Requested medication (s) are due for refill today: Yes  Requested medication (s) are on the active medication list: Yes  Last refill:  07/28/22  Future visit scheduled: No  Notes to clinic:  Unable to refill per protocol due to failed labs, no updated results.      Requested Prescriptions  Pending Prescriptions Disp Refills   rosuvastatin (CRESTOR) 5 MG tablet [Pharmacy Med Name: ROSUVASTATIN CALCIUM 5 MG TAB] 15 tablet 0    Sig: TAKE 1 TABLET BY MOUTH EVERY OTHER DAY     Cardiovascular:  Antilipid - Statins 2 Failed - 09/11/2022  2:53 PM      Failed - Cr in normal range and within 360 days    Creatinine, Ser  Date Value Ref Range Status  06/03/2021 0.82 0.57 - 1.00 mg/dL Final         Failed - Lipid Panel in normal range within the last 12 months    Cholesterol, Total  Date Value Ref Range Status  12/02/2020 154 100 - 199 mg/dL Final   LDL Chol Calc (NIH)  Date Value Ref Range Status  12/02/2020 68 0 - 99 mg/dL Final   HDL  Date Value Ref Range Status  12/02/2020 59 >39 mg/dL Final   Triglycerides  Date Value Ref Range Status  12/02/2020 161 (H) 0 - 149 mg/dL Final         Passed - Patient is not pregnant      Passed - Valid encounter within last 12 months    Recent Outpatient Visits           6 months ago Encounter for annual health examination   [place] [facility] [place], Marzella Schlein, MD   7 months ago Right hip pain   [facility] [facility] [place], Marzella Schlein, MD   1 year ago Essential hypertension   [place] [facility] [place], Marzella Schlein, MD   1 year ago Encounter for annual physical exam   [place] [facility] [place], Marzella Schlein, MD   2 years ago Left hip pain   [facility] [place], Jodell [place], [place]

## 2022-09-12 ENCOUNTER — Other Ambulatory Visit: Payer: Self-pay | Admitting: Family Medicine

## 2022-10-01 ENCOUNTER — Ambulatory Visit (INDEPENDENT_AMBULATORY_CARE_PROVIDER_SITE_OTHER): Payer: Medicaid Other | Admitting: Psychiatry

## 2022-10-01 ENCOUNTER — Encounter: Payer: Self-pay | Admitting: Psychiatry

## 2022-10-01 VITALS — BP 146/87 | HR 98 | Temp 97.2°F | Ht 64.0 in | Wt 190.2 lb

## 2022-10-01 DIAGNOSIS — F411 Generalized anxiety disorder: Secondary | ICD-10-CM | POA: Diagnosis not present

## 2022-10-01 DIAGNOSIS — F17200 Nicotine dependence, unspecified, uncomplicated: Secondary | ICD-10-CM

## 2022-10-01 DIAGNOSIS — F3131 Bipolar disorder, current episode depressed, mild: Secondary | ICD-10-CM

## 2022-10-01 DIAGNOSIS — Z9189 Other specified personal risk factors, not elsewhere classified: Secondary | ICD-10-CM

## 2022-10-01 DIAGNOSIS — Z8659 Personal history of other mental and behavioral disorders: Secondary | ICD-10-CM

## 2022-10-01 DIAGNOSIS — F1421 Cocaine dependence, in remission: Secondary | ICD-10-CM

## 2022-10-01 DIAGNOSIS — Z96651 Presence of right artificial knee joint: Secondary | ICD-10-CM | POA: Diagnosis not present

## 2022-10-01 DIAGNOSIS — F5105 Insomnia due to other mental disorder: Secondary | ICD-10-CM

## 2022-10-01 MED ORDER — ARIPIPRAZOLE 2 MG PO TABS
2.0000 mg | ORAL_TABLET | Freq: Every morning | ORAL | 0 refills | Status: DC
Start: 2022-10-01 — End: 2022-10-26

## 2022-10-01 NOTE — Patient Instructions (Signed)
Please call for EKG - [phone]  Aripiprazole Tablets What is this medication? ARIPIPRAZOLE (ay ri PIP ray zole) treats schizophrenia, bipolar I disorder, autism spectrum disorder, and Tourette disorder. It may also be used with antidepressant medications to treat depression. It works by balancing the levels of dopamine and serotonin in the brain, hormones that help regulate mood, behaviors, and thoughts. It belongs to a group of medications called antipsychotics. Antipsychotics can be used to treat several kinds of mental health conditions. This medicine may be used for other purposes; ask your health care provider or pharmacist if you have questions. COMMON BRAND NAME(S): Abilify What should I tell my care team before I take this medication? They need to know if you have any of these conditions: Dementia Diabetes Difficulty swallowing Have trouble controlling your muscles Heart disease History of irregular heartbeat History of stroke Low blood cell levels (white cells, red cells, and platelets) Low blood pressure Parkinson disease Seizures Suicidal thoughts, plans, or attempt by you or a family member Urges to engage in impulsive behaviors in ways that are unusual for you An unusual or allergic reaction to aripiprazole, other medications, foods, dyes, or preservatives Pregnant or trying to get pregnant Breastfeeding How should I use this medication? Take this medication by mouth with a glass of water. Take it as directed on the prescription label at the same time every day. You can take it with or without food. If it upsets your stomach, take it with food. Do not take your medication more often than directed. Keep taking it unless your care team tells you to stop. A special MedGuide will be given to you by the pharmacist with each prescription and refill. Be sure to read this information carefully each time. Talk to your care team about the use of this medication in children. While  it may be prescribed for children as young as 6 years for selected conditions, precautions do apply. Overdosage: If you think you have taken too much of this medicine contact a poison control center or emergency room at once. NOTE: This medicine is only for you. Do not share this medicine with others. What if I miss a dose? If you miss a dose, take it as soon as you can. If it is almost time for your next dose, take only that dose. Do not take double or extra doses. What may interact with this medication? Do not take this medication with any of the following: Brexpiprazole Cisapride Dextromethorphan; quinidine Dronedarone Metoclopramide Pimozide Quinidine Thioridazine This medication may also interact with the following: Antihistamines for allergy, cough, and cold Carbamazepine Certain medications for anxiety or sleep Certain medications for depression, such as amitriptyline, fluoxetine, paroxetine, or sertraline Certain medications for fungal infections, such as fluconazole, itraconazole, ketoconazole, posaconazole, or voriconazole Clarithromycin General anesthetics, such as halothane, isoflurane, methoxyflurane, or propofol Medications for Parkinson disease, such as levodopa Medications for blood pressure Medications for seizures Medications that relax muscles for surgery Opioid medications for pain Other medications that cause heart rhythm changes Phenothiazines, such as chlorpromazine or prochlorperazine Rifampin This list may not describe all possible interactions. Give your health care provider a list of all the medicines, herbs, non-prescription drugs, or dietary supplements you use. Also tell them if you smoke, drink alcohol, or use illegal drugs. Some items may interact with your medicine. What should I watch for while using this medication? Visit your care team for regular checks on your progress. Tell your care team if your symptoms do not start to  get better or if they  get worse. Do not suddenly stop taking this medication. You may develop a severe reaction. Your care team will tell you how much medication to take. If your care team wants you to stop the medication, the dose may be slowly lowered over time to avoid any side effects. Patients and their families should watch out for new or worsening depression or thoughts of suicide. Also watch out for sudden changes in feelings such as feeling anxious, agitated, panicky, irritable, hostile, aggressive, impulsive, severely restless, overly excited and hyperactive, or not being able to sleep. If this happens, especially at the beginning of antidepressant treatment or after a change in dose, call your care team. This medication may affect your coordination, reaction time, or judgment. Do not drive or operate machinery until you know how this medication affects you. Sit up or stand slowly to reduce the risk of dizzy or fainting spells. Drinking alcohol with this medication can increase the risk of these side effects. This medication can cause problems with controlling your body temperature. It can lower the response of your body to cold temperatures. If possible, stay indoors during cold weather. If you must go outdoors, wear warm clothes. It can also lower the response of your body to heat. Do not overheat. Do not over-exercise. Stay out of the sun when possible. If you must be in the sun, wear cool clothing. Drink plenty of water. If you have trouble controlling your body temperature, call your care team right away. This medication may cause dry eyes and blurred vision. If you wear contact lenses, you may feel some discomfort. Lubricating eye drops may help. See your care team if the problem does not go away or is severe. This medication may increase blood sugar. Ask your care team if changes in diet or medications are needed if you have diabetes. There have been reports of increased sexual urges or other strong urges such as  gambling while taking this medication. If you experience any of these while taking this medication, you should report this to your care team as soon as possible. What side effects may I notice from receiving this medication? Side effects that you should report to your care team as soon as possible: Allergic reactions--skin rash, itching, hives, swelling of the face, lips, tongue, or throat High blood sugar (hyperglycemia)--increased thirst or amount of urine, unusual weakness or fatigue, blurry vision High fever, stiff muscles, increased sweating, fast or irregular heartbeat, and confusion, which may be signs of neuroleptic malignant syndrome Low blood pressure--dizziness, feeling faint or lightheaded, blurry vision Pain or trouble swallowing Prolonged or painful erection Seizures Stroke--sudden numbness or weakness of the face, arm, or leg, trouble speaking, confusion, trouble walking, loss of balance or coordination, dizziness, severe headache, change in vision Uncontrolled and repetitive body movements, muscle stiffness or spasms, tremors or shaking, loss of balance or coordination, restlessness, shuffling walk, which may be signs of extrapyramidal symptoms (EPS) Thoughts of suicide or self-harm, worsening mood, feelings of depression Urges to engage in impulsive behaviors such as gambling, binge eating, sexual activity, or shopping in ways that are unusual for you Side effects that usually do not require medical attention (report these to your care team if they continue or are bothersome): Constipation Drowsiness Weight gain This list may not describe all possible side effects. Call your doctor for medical advice about side effects. You may report side effects to FDA at 1-800-FDA-1088. Where should I keep my medication? Keep out of the  reach of children and pets. Store at room temperature between 15 and 30 degrees C (59 and 86 degrees F). Throw away any unused medication after the expiration  date. NOTE: This sheet is a summary. It may not cover all possible information. If you have questions about this medicine, talk to your doctor, pharmacist, or health care provider.  2024 Elsevier/Gold Standard (2021-09-13 00:00:00)

## 2022-10-02 NOTE — Progress Notes (Signed)
BH MD OP Progress Note  10/02/2022 12:51 PM Alexa Newman  MRN:  [id number]  Chief Complaint:  Chief Complaint  Patient presents with   Follow-up   Anxiety   Depression   Medication Refill   HPI: Alexa Newman is a 62 year old Caucasian female, divorced, on SSI, lives in [place], has a history of bipolar disorder, borderline personality disorder, cocaine use disorder, GAD, insomnia, tobacco use disorder was evaluated in office today.  Patient today reports she has noticed worsening mood symptoms in the past several days.  Patient reports she has sadness, low energy, low motivation, concentration problems as well as excessive sleepiness during the day.  Patient reports she does struggle with psychosocial stressors of her brother who has relapsed on alcohol again and is currently going to be homeless.  That does worry her.  Patient reports sleep at night is overall good when she takes the doxepin.  Patient is currently denying any suicidality or homicidality.  Patient however does report that if she looks at anything like an object for a long period ,she feels it changes into something else.  She is aware that is likely due to her current stressors.  Patient reports she continues to have good support system from her daughter.  Currently compliant on her Lamictal.  Denies side effects.  Hydroxyzine does help when she takes it as needed for her anxiety symptoms.  Denies side effects.  Denies any other concerns today.  Visit Diagnosis:    ICD-10-CM   1. Bipolar 1 disorder, depressed, mild (HCC)  F31.31 EKG 12-Lead    TSH    ARIPiprazole (ABILIFY) 2 MG tablet    2. GAD (generalized anxiety disorder)  F41.1 TSH    3. Insomnia due to mental condition  F51.05 TSH   mood    4. Tobacco use disorder  F17.200     5. At risk for prolonged QT interval syndrome  Z91.89 EKG 12-Lead    6. Hx of borderline personality disorder  Z86.59     7. Cocaine use disorder, moderate, in  sustained remission (HCC)  F14.21       Past Psychiatric History: I have reviewed past psychiatric history from progress note on 07/27/2017.  Past Medical History:  Past Medical History:  Diagnosis Date   Anxiety    Arthritis    Bipolar 1 disorder (HCC)    Depression    Family history of adverse reaction to anesthesia    half sister claims that she would stop breathing during surgery x 2   Hypertension    Osteoarthritis    PONV (postoperative nausea and vomiting)    Pre-diabetes     Past Surgical History:  Procedure Laterality Date   ANTERIOR CRUCIATE LIGAMENT (ACL) REVISION Right    CARPAL TUNNEL RELEASE     COLONOSCOPY     COLONOSCOPY WITH PROPOFOL N/A 11/17/2017   Procedure: COLONOSCOPY WITH PROPOFOL;  Surgeon: Pasty Spillers, MD;  Location: [facility] END[facility]COPY;  Service: Endoscopy;  Laterality: N/A;   DILATION AND CURETTAGE OF UTERUS  2013   for heavy bleeding   ENDOMETRIAL ABLATION  2013   KNEE ARTHROPLASTY Right 10/02/2020   Procedure: COMPUTER ASSISTED TOTAL KNEE ARTHROPLASTY;  Surgeon: Donato Heinz, MD;  Location: [facility] ORS;  Service: Orthopedics;  Laterality: Right;   right wrist surgery     TUBAL LIGATION     WRIST ARTHROSCOPY Left 1990   for degenerative changes    Family Psychiatric History: I have reviewed family psychiatric history  from my progress note on 07/27/2017.  Family History:  Family History  Problem Relation Age of Onset   Diabetes Mother    Depression Mother    Hypertension Mother    Crohn's disease Mother    Lung cancer Mother 34       former smoker   Congestive Heart Failure Father    Breast cancer Sister 61   Alcohol abuse Sister    Drug abuse Sister    Anxiety disorder Sister    Depression Sister    Bipolar disorder Sister    Alcohol abuse Brother    Drug abuse Brother    Anxiety disorder Brother    Depression Brother    Colon polyps Brother    HIV Brother    Alcohol abuse Brother    Drug abuse Brother    Anxiety disorder  Brother    Depression Brother    Colon polyps Brother    HIV Brother    Skin cancer Maternal Grandfather    Colon cancer Neg Hx    Ovarian cancer Neg Hx    Cervical cancer Neg Hx     Social History: I have reviewed social history from my progress note on 07/27/2017. Social History   Socioeconomic History   Marital status: Divorced    Spouse name: Not on file   Number of children: 1   Years of education: Not on file   Highest education level: Some college, no degree  Occupational History   Occupation: disabled    Comment: on SSI awaiting decision about disability  Tobacco Use   Smoking status: Some Days    Types: Cigarettes   Smokeless tobacco: Never   Tobacco comments:    Currently smokes 1 cig every few days  Vaping Use   Vaping status: Former  Substance and Sexual Activity   Alcohol use: Yes    Comment: 1 times per month   Drug use: Not Currently    Comment: former crack cocaine user   Sexual activity: Yes    Partners: Male    Birth control/protection: Post-menopausal, Surgical  Other Topics Concern   Not on file  Social History Narrative   Live with brother and his husband   Social Determinants of Health   Financial Resource Strain: High Risk (07/27/2017)   Overall Financial Resource Strain (CARDIA)    Difficulty of Paying Living Expenses: Hard  Food Insecurity: Food Insecurity Present (07/27/2017)   Hunger Vital Sign    Worried About Running Out of Food in the Last Year: Often true    Ran Out of Food in the Last Year: Often true  Transportation Needs: Unmet Transportation Needs (07/27/2017)   PRAPARE - Administrator, Civil Service (Medical): Yes    Lack of Transportation (Non-Medical): Yes  Physical Activity: Insufficiently Active (07/27/2017)   Exercise Vital Sign    Days of Exercise per Week: 4 days    Minutes of Exercise per Session: 20 min  Stress: Stress Concern Present (07/27/2017)   [employer] of Occupational Health - Occupational  Stress Questionnaire    Feeling of Stress : Very much  Social Connections: Somewhat Isolated (07/27/2017)   Social Connection and Isolation Panel [NHANES]    Frequency of Communication with Friends and Family: More than three times a week    Frequency of Social Gatherings with Friends and Family: Once a week    Attends Religious Services: Never    Database administrator or Organizations: No    Attends [employer]  or Organization Meetings: Never    Marital Status: Married    Allergies:  Allergies  Allergen Reactions   Pregabalin Other (See Comments)    Other reaction(s): Other (See Comments) Suicidal ideation  Suicidal ideation    Metabolic Disorder Labs: Lab Results  Component Value Date   HGBA1C 5.8 (A) 06/02/2021   No results found for: "PROLACTIN" Lab Results  Component Value Date   CHOL 154 12/02/2020   TRIG 161 (H) 12/02/2020   HDL 59 12/02/2020   CHOLHDL 2.6 12/02/2020   LDLCALC 68 12/02/2020   LDLCALC 77 05/27/2020   Lab Results  Component Value Date   TSH 0.801 12/02/2020   TSH 0.983 11/27/2019    Therapeutic Level Labs: No results found for: "LITHIUM" No results found for: "VALPROATE" No results found for: "CBMZ"  Current Medications: Current Outpatient Medications  Medication Sig Dispense Refill   amLODipine (NORVASC) 10 MG tablet TAKE 1 TABLET BY MOUTH EVERY DAY 90 tablet 0   ARIPiprazole (ABILIFY) 2 MG tablet Take 1 tablet (2 mg total) by mouth in the morning. 30 tablet 0   calcium-vitamin D (OSCAL WITH D) 500-5 MG-MCG tablet Take 1 tablet by mouth.     celecoxib (CELEBREX) 100 MG capsule Take 200 mg by mouth 2 (two) times daily.     doxepin (SINEQUAN) 10 MG capsule TAKE 1 CAPSULE (10 MG TOTAL) BY MOUTH AT BEDTIME AS NEEDED. FOR SLEEP 90 capsule 0   hydrOXYzine (ATARAX) 10 MG tablet TAKE 1 TABLET BY MOUTH 3 TIMES DAILY AS NEEDED FOR ANXIETY. 270 tablet 0   lamoTRIgine (LAMICTAL) 100 MG tablet TAKE 1 TABLET BY MOUTH TWICE A DAY 180 tablet 0   methocarbamol  (ROBAXIN) 500 MG tablet Take 500 mg by mouth 2 (two) times daily.     nicotine (NICODERM CQ - DOSED IN MG/24 HOURS) 21 mg/24hr patch PLACE 1 PATCH ONTO THE SKIN DAILY. 28 patch 3   rosuvastatin (CRESTOR) 5 MG tablet Take 1 tablet (5 mg total) by mouth every other day. Please schedule office visit before any future refill 15 tablet 0   triamterene-hydrochlorothiazide (MAXZIDE-25) 37.5-25 MG tablet TAKE 1 TABLET BY MOUTH EVERY DAY 90 tablet 0   UNABLE TO FIND Take 1 tablet by mouth in the morning and at bedtime. Med Name: calcium, magnesium and zinc     No current facility-administered medications for this visit.     Musculoskeletal: Strength & Muscle Tone: within normal limits Gait & Station: normal Patient leans: N/A  Psychiatric Specialty Exam: Review of Systems  Psychiatric/Behavioral:  Positive for decreased concentration, dysphoric mood and sleep disturbance. The patient is nervous/anxious.     Blood pressure (!) 146/87, pulse 98, temperature (!) 97.2 F (36.2 C), temperature source Skin, height 5\' 4"  (1.626 m), weight 190 lb 3.2 oz (86.3 kg), last menstrual period 12/07/2011.Body mass index is 32.65 kg/m.  General Appearance: Fairly Groomed  Eye Contact:  Good  Speech:  Normal Rate  Volume:  Normal  Mood:  Anxious and Depressed  Affect:  Congruent  Thought Process:  Goal Directed and Descriptions of Associations: Intact  Orientation:  Full (Time, Place, and Person)  Thought Content: Ilusions   Suicidal Thoughts:  No  Homicidal Thoughts:  No  Memory:  Immediate;   Fair Recent;   Fair Remote;   Fair  Judgement:  Fair  Insight:  Fair  Psychomotor Activity:  Normal  Concentration:  Concentration: Fair and Attention Span: Fair  Recall:  [employer] of Knowledge: Fair  Language: Fair  Akathisia:  No  Handed:  Right  AIMS (if indicated): not done  Assets:  Communication Skills Desire for Improvement Housing Social Support  ADL's:  Intact  Cognition: WNL  Sleep:    Excessive during the day   Screenings: AIMS    Flowsheet Row Video Visit from 10/16/2021 in [facility] Video Visit from 07/17/2021 in [facility]  AIMS Total Score 0 0      GAD-7    Flowsheet Row Office Visit from 10/01/2022 in [facility] Video Visit from 10/16/2021 in [facility] Video Visit from 07/17/2021 in [facility] Video Visit from 06/19/2021 in [facility] Office Visit from 01/14/2021 in [facility]  Total GAD-7 Score 7 13 2 13 1       PHQ2-9    Flowsheet Row Office Visit from 10/01/2022 in [facility] Video Visit from 05/28/2022 in [facility] Office Visit from 03/13/2022 in [facility] Video Visit from 11/24/2021 in [facility] Video Visit from 10/16/2021 in [facility]  PHQ-2 Total Score 0 0 1 0 0  PHQ-9 Total Score 6 -- 3 -- --      Flowsheet Row Office Visit from 10/01/2022 in [facility] Video Visit from 05/28/2022 in [facility] Video Visit from 03/06/2022 in [facility]  C-SSRS RISK CATEGORY Low Risk Low Risk Low Risk        Assessment and Plan: Alexa Newman is a 62 year old Caucasian female, divorced, on SSI, lives in [place], has a history of bipolar disorder, borderline personality disorder, insomnia was evaluated in office today.  Patient with current situational stressors which does have an impact on her mood, worsening mood symptoms, will benefit from the following  plan.  Plan Bipolar disorder type I depressed mild-unstable Continue lamotrigine 100 mg p.o. twice daily Start Abilify low dosage of 2 mg p.o. daily in the morning.   GAD-unstable Hydroxyzine 10 mg p.o. three times daily as needed Will consider adding an SSRI in the future as needed. Patient may benefit from psychotherapy-patient to establish care with a therapist. Discussed mindfulness techniques.  Insomnia-stable  Patient sleeps okay at night. Continue doxepin 10 mg p.o. nightly as needed  Tobacco use disorder-unstable Provided counseling for 1 minute.  At risk for prolonged QT syndrome-we will order EKG-patient to call [phone] since patient is currently being started on Abilify.  Follow-up in clinic in 4 weeks or sooner if needed. Collaboration of Care: Collaboration of Care: Referral or follow-up with counselor/therapist AEB patient to establish care with a therapist.  Patient/Guardian was advised Release of Information must be obtained prior to any record release in order to collaborate their care with an outside provider. Patient/Guardian was advised if they have not already done so to contact the registration department to sign all necessary forms in order for [place] to release information regarding their care.   Consent: Patient/Guardian gives verbal consent for treatment and assignment of benefits for services provided during this visit. Patient/Guardian expressed understanding and agreed to proceed.   This note was generated in part or whole with voice recognition software. Voice recognition is usually quite accurate but there are transcription errors that can and very  often do occur. I apologize for any typographical errors that were not detected and corrected.    Jomarie Longs, MD 10/02/2022, 12:51 PM

## 2022-10-03 ENCOUNTER — Other Ambulatory Visit: Payer: Self-pay | Admitting: Family Medicine

## 2022-10-03 ENCOUNTER — Other Ambulatory Visit: Payer: Self-pay | Admitting: Psychiatry

## 2022-10-03 DIAGNOSIS — F411 Generalized anxiety disorder: Secondary | ICD-10-CM

## 2022-10-05 ENCOUNTER — Encounter: Payer: Self-pay | Admitting: Family Medicine

## 2022-10-05 ENCOUNTER — Ambulatory Visit: Payer: Medicaid Other | Admitting: Family Medicine

## 2022-10-05 VITALS — BP 134/87 | HR 87 | Temp 98.2°F | Resp 12 | Ht 64.0 in | Wt 190.3 lb

## 2022-10-05 DIAGNOSIS — E66811 Obesity, class 1: Secondary | ICD-10-CM

## 2022-10-05 DIAGNOSIS — R7303 Prediabetes: Secondary | ICD-10-CM | POA: Diagnosis not present

## 2022-10-05 DIAGNOSIS — E782 Mixed hyperlipidemia: Secondary | ICD-10-CM

## 2022-10-05 DIAGNOSIS — G8929 Other chronic pain: Secondary | ICD-10-CM

## 2022-10-05 DIAGNOSIS — M25561 Pain in right knee: Secondary | ICD-10-CM

## 2022-10-05 DIAGNOSIS — Z6832 Body mass index (BMI) 32.0-32.9, adult: Secondary | ICD-10-CM | POA: Diagnosis not present

## 2022-10-05 DIAGNOSIS — Z0000 Encounter for general adult medical examination without abnormal findings: Secondary | ICD-10-CM | POA: Diagnosis not present

## 2022-10-05 DIAGNOSIS — F17200 Nicotine dependence, unspecified, uncomplicated: Secondary | ICD-10-CM | POA: Diagnosis not present

## 2022-10-05 DIAGNOSIS — E669 Obesity, unspecified: Secondary | ICD-10-CM

## 2022-10-05 DIAGNOSIS — I10 Essential (primary) hypertension: Secondary | ICD-10-CM | POA: Diagnosis not present

## 2022-10-05 DIAGNOSIS — R7989 Other specified abnormal findings of blood chemistry: Secondary | ICD-10-CM | POA: Diagnosis not present

## 2022-10-05 MED ORDER — CELECOXIB 100 MG PO CAPS: 100.0000 mg | ORAL_CAPSULE | Freq: Two times a day (BID) | ORAL | 2 refills | Status: DC

## 2022-10-05 NOTE — Assessment & Plan Note (Signed)
Well controlled on Triamterene-HCTZ 37.5-25mg  daily and Amlodipine 10mg  daily. -Continue current medications. - recheck CMP

## 2022-10-05 NOTE — Assessment & Plan Note (Signed)
Managed on Crestor 5mg  every other day. -Continue current medication. - recheck cmp and lipids

## 2022-10-05 NOTE — Progress Notes (Signed)
Established Patient Office Visit  Subjective   Patient ID: Alexa Newman, female    DOB: August 27, 1960  Age: 62 y.o. MRN: [id number]  Chief Complaint  Patient presents with   Medical Management of Chronic Issues    Patient reports checking BP some times. She reports stable readings.     HPI  Discussed the use of AI scribe software for clinical note transcription with the patient, who gave verbal consent to proceed.  History of Present Illness   The patient, a 62 year old with a history of hypertension, prediabetes, hyperlipidemia, and bipolar I disorder, presents for a routine follow-up. She is currently managed on Crestor 5 mg every other day for hyperlipidemia, triamterene HCTZ 37.5-25 mg daily, and amlodipine 10 mg daily for hypertension. The patient's bipolar disorder is managed by a psychiatrist who recently requested thyroid function tests. The patient has not yet completed these tests or the routine labs ordered by this office.         ROS per HPI    Objective:     BP 134/87 (BP Location: Left Arm, Patient Position: Sitting, Cuff Size: Large)   Pulse 87   Temp 98.2 F (36.8 C) (Temporal)   Resp 12   Ht 5\' 4"  (1.626 m)   Wt 190 lb 4.8 oz (86.3 kg)   LMP 12/07/2011   SpO2 99%   BMI 32.66 kg/m    Physical Exam Vitals reviewed.  Constitutional:      General: She is not in acute distress.    Appearance: Normal appearance. She is well-developed. She is not diaphoretic.  HENT:     Head: Normocephalic and atraumatic.  Eyes:     General: No scleral icterus.    Conjunctiva/sclera: Conjunctivae normal.  Neck:     Thyroid: No thyromegaly.  Cardiovascular:     Rate and Rhythm: Normal rate and regular rhythm.     Heart sounds: Normal heart sounds. No murmur heard. Pulmonary:     Effort: Pulmonary effort is normal. No respiratory distress.     Breath sounds: Normal breath sounds. No wheezing, rhonchi or rales.  Musculoskeletal:     Cervical back: Neck supple.      Right lower leg: No edema.     Left lower leg: No edema.  Lymphadenopathy:     Cervical: No cervical adenopathy.  Skin:    General: Skin is warm and dry.     Findings: No rash.  Neurological:     Mental Status: She is alert and oriented to person, place, and time. Mental status is at baseline.  Psychiatric:        Mood and Affect: Mood normal.        Behavior: Behavior normal.      No results found for any visits on 10/05/22.    The 10-year ASCVD risk score (Arnett DK, et al., 2019) is: 8.6%    Assessment & Plan:   Problem List Items Addressed This Visit       Cardiovascular and Mediastinum   Essential hypertension - Primary    Well controlled on Triamterene-HCTZ 37.5-25mg  daily and Amlodipine 10mg  daily. -Continue current medications. - recheck CMP        Other   Tobacco use disorder    Counseled on cessation Down to 2 cigs per day      Obesity    Discussed importance of healthy weight management Discussed diet and exercise       Prediabetes    Encourage low carb diet - recheck  A1c. -Continue current management.      Chronic pain of right knee    Ok to continue celebrex prn - discussed risk of long term NSAIDs      Hyperlipidemia    Managed on Crestor 5mg  every other day. -Continue current medication. - recheck cmp and lipids          Bipolar I Disorder: Managed by psychiatry. Psychiatrist requested thyroid function tests. -Coordinate with psychiatrist and conduct thyroid function tests.  General Health Maintenance: -Order routine blood work today.        Return in about 6 months (around 04/07/2023) for CPE.    Shirlee Latch, MD

## 2022-10-05 NOTE — Assessment & Plan Note (Signed)
Encourage low carb diet - recheck A1c. -Continue current management.

## 2022-10-05 NOTE — Assessment & Plan Note (Signed)
Counseled on cessation Down to 2 cigs per day

## 2022-10-05 NOTE — Assessment & Plan Note (Signed)
Discussed importance of healthy weight management Discussed diet and exercise  

## 2022-10-05 NOTE — Assessment & Plan Note (Signed)
Ok to continue celebrex prn - discussed risk of long term NSAIDs

## 2022-10-07 ENCOUNTER — Ambulatory Visit
Admission: RE | Admit: 2022-10-07 | Discharge: 2022-10-07 | Disposition: A | Discharge status: Home / Self Care | Payer: Medicaid Other | Source: Ambulatory Visit | Attending: Psychiatry | Admitting: Psychiatry

## 2022-10-10 ENCOUNTER — Emergency Department
Admission: EM | Admit: 2022-10-10 | Discharge: 2022-10-10 | Disposition: A | Discharge status: Home / Self Care | Payer: Medicaid Other | Attending: Student in an Organized Health Care Education/Training Program | Admitting: Student in an Organized Health Care Education/Training Program

## 2022-10-10 ENCOUNTER — Emergency Department: Payer: Medicaid Other

## 2022-10-10 ENCOUNTER — Other Ambulatory Visit: Payer: Self-pay

## 2022-10-10 DIAGNOSIS — M19012 Primary osteoarthritis, left shoulder: Secondary | ICD-10-CM | POA: Diagnosis not present

## 2022-10-10 DIAGNOSIS — I10 Essential (primary) hypertension: Secondary | ICD-10-CM | POA: Insufficient documentation

## 2022-10-10 DIAGNOSIS — M25512 Pain in left shoulder: Secondary | ICD-10-CM | POA: Diagnosis not present

## 2022-10-10 DIAGNOSIS — M25511 Pain in right shoulder: Secondary | ICD-10-CM | POA: Diagnosis not present

## 2022-10-10 DIAGNOSIS — M25519 Pain in unspecified shoulder: Secondary | ICD-10-CM | POA: Diagnosis not present

## 2022-10-10 DIAGNOSIS — M19011 Primary osteoarthritis, right shoulder: Secondary | ICD-10-CM | POA: Diagnosis not present

## 2022-10-10 DIAGNOSIS — I771 Stricture of artery: Secondary | ICD-10-CM | POA: Diagnosis not present

## 2022-10-10 LAB — BASIC METABOLIC PANEL
Anion gap: 10 (ref 5–15)
BUN: 16 mg/dL (ref 8–23)
CO2: 24 mmol/L (ref 22–32)
Calcium: 9.6 mg/dL (ref 8.9–10.3)
Chloride: 103 mmol/L (ref 98–111)
Creatinine, Ser: 0.72 mg/dL (ref 0.44–1.00)
GFR, Estimated: >60 mL/min (ref >60)
Glucose, Bld: 107 mg/dL — ABNORMAL HIGH (ref 70–99)
Potassium: 3.4 mmol/L — ABNORMAL LOW (ref 3.5–5.1)
Sodium: 137 mmol/L (ref 135–145)

## 2022-10-10 LAB — D-DIMER, QUANTITATIVE: D-Dimer, Quant: 0.37 ug/mL-FEU (ref 0.00–0.50)

## 2022-10-10 LAB — CBC
HCT: 43.8 % (ref 36.0–46.0)
Hemoglobin: 13.8 g/dL (ref 12.0–15.0)
MCH: 26.1 pg (ref 26.0–34.0)
MCHC: 31.5 g/dL (ref 30.0–36.0)
MCV: 83 fL (ref 80.0–100.0)
Platelets: 279 10*3/uL (ref 150–400)
RBC: 5.28 MIL/uL — ABNORMAL HIGH (ref 3.87–5.11)
RDW: 13.8 % (ref 11.5–15.5)
WBC: 9.8 10*3/uL (ref 4.0–10.5)
nRBC: 0 % (ref 0.0–0.2)

## 2022-10-10 LAB — TROPONIN I (HIGH SENSITIVITY): Troponin I (High Sensitivity): <2 ng/L (ref <18)

## 2022-10-10 MED ORDER — LIDOCAINE 5 % EX PTCH: 1.0000 | MEDICATED_PATCH | Freq: Two times a day (BID) | CUTANEOUS | 0 refills | Status: DC

## 2022-10-10 MED ORDER — CYCLOBENZAPRINE HCL 10 MG PO TABS: 10.0000 mg | ORAL_TABLET | Freq: Three times a day (TID) | ORAL | 0 refills | Status: DC | PRN

## 2022-10-10 MED ORDER — ACETAMINOPHEN 325 MG PO TABS
650.0000 mg | ORAL_TABLET | Freq: Once | ORAL | Status: AC
  Administered 2022-10-10: 650 mg via ORAL
  Filled 2022-10-10: qty 2

## 2022-10-10 MED ORDER — CYCLOBENZAPRINE HCL 10 MG PO TABS
10.0000 mg | ORAL_TABLET | Freq: Once | ORAL | Status: AC
  Administered 2022-10-10: 10 mg via ORAL
  Filled 2022-10-10: qty 1

## 2022-10-10 MED ORDER — LIDOCAINE 5 % EX PTCH
1.0000 | MEDICATED_PATCH | CUTANEOUS | Status: DC
  Administered 2022-10-10: 1 via TRANSDERMAL
  Filled 2022-10-10: qty 1

## 2022-10-10 NOTE — ED Provider Notes (Signed)
[facility] Provider Note    Event Date/Time   First MD Initiated Contact with Patient 10/10/22 [phone]     (approximate)   History   Shoulder Pain   HPI  Alexa Newman is a 62 y.o. female   history of anxiety depression hypertension osteoarthritis presents to the ER for evaluation of left shoulder pain and posterior back pain he has been having some left shoulder pain for the past few weeks.  Started having worsening achiness to the left posterior shoulder after washing windshield yesterday.  Does have some pleuritic discomfort.  Denies any falls or injury.  No anterior chest pain.  No nausea or vomiting.      Physical Exam   Triage Vital Signs: ED Triage Vitals  Encounter Vitals Group     BP 10/10/22 0924 (!) 154/95     Systolic BP Percentile --      Diastolic BP Percentile --      Pulse Rate 10/10/22 0924 (!) 106     Resp 10/10/22 0924 16     Temp 10/10/22 0924 98.4 F (36.9 C)     Temp src --      SpO2 10/10/22 0924 92 %     Weight 10/10/22 0925 195 lb (88.5 kg)     Height 10/10/22 0925 5\' 4"  (1.626 m)     Head Circumference --      Peak Flow --      Pain Score 10/10/22 0925 10     Pain Loc --      Pain Education --      Exclude from Growth Chart --     Most recent vital signs: Vitals:   10/10/22 0924  BP: (!) 154/95  Pulse: (!) 106  Resp: 16  Temp: 98.4 F (36.9 C)  SpO2: 92%     Constitutional: Alert  Eyes: Conjunctivae are normal.  Head: Atraumatic. Nose: No congestion/rhinnorhea. Mouth/Throat: Mucous membranes are moist.   Neck: Painless ROM.  Cardiovascular:   Good peripheral circulation. Respiratory: Normal respiratory effort.  No retractions.  Gastrointestinal: Soft and nontender.  Musculoskeletal:  no deformity Neurologic:  MAE spontaneously. No gross focal neurologic deficits are appreciated.  Skin:  Skin is warm, dry and intact. No rash noted. Psychiatric: Mood and affect are normal. Speech and behavior are  normal.    ED Results / Procedures / Treatments   Labs (all labs ordered are listed, but only abnormal results are displayed) Labs Reviewed  BASIC METABOLIC PANEL - Abnormal; Notable for the following components:      Result Value   Potassium 3.4 (*)    Glucose, Bld 107 (*)    All other components within normal limits  CBC - Abnormal; Notable for the following components:   RBC 5.28 (*)    All other components within normal limits  D-DIMER, QUANTITATIVE (NOT AT [facility])  TROPONIN I (HIGH SENSITIVITY)     EKG  ED ECG REPORT I, Willy Eddy, the attending physician, personally viewed and interpreted this ECG.   Date: 10/10/2022  EKG Time: 9:30  Rate: 95  Rhythm: sinus  Axis: normal  Intervals: normal  ST&T Change: no stemi, no depressions    RADIOLOGY Please see ED Course for my review and interpretation.  I personally reviewed all radiographic images ordered to evaluate for the above acute complaints and reviewed radiology reports and findings.  These findings were personally discussed with the patient.  Please see medical record for radiology report.  PROCEDURES:  Critical Care performed: No  Procedures   MEDICATIONS ORDERED IN ED: Medications  lidocaine (LIDODERM) 5 % 1 patch (1 patch Transdermal Patch Applied 10/10/22 1035)  acetaminophen (TYLENOL) tablet 650 mg (650 mg Oral Given 10/10/22 1035)  cyclobenzaprine (FLEXERIL) tablet 10 mg (10 mg Oral Given 10/10/22 1035)     IMPRESSION / MDM / ASSESSMENT AND PLAN / ED COURSE  I reviewed the triage vital signs and the nursing notes.                              Differential diagnosis includes, but is not limited to, ACS, pericarditis, esophagitis, boerhaaves, pe, dissection, pna, bronchitis, costochondritis  Patient presenting to the ER for evaluation of symptoms as described above.  Based on symptoms, risk factors and considered above differential, this presenting complaint could reflect a potentially  life-threatening illness therefore the patient will be placed on continuous pulse oximetry and telemetry for monitoring.  Laboratory evaluation will be sent to evaluate for the above complaints.  Patient nontoxic-appearing with reproducible posterior left shoulder pain.  Not consistent with infectious process no sign of shingles.  Will rule out cardiac etiology   Clinical Course as of 10/10/22 1147  Sat Oct 10, 2022  1003 Chest x-ray on my review and interpretation does not show any evidence of infiltrate or consolidation. [PR]  1145 D-dimer negative.  Troponin negative after 2 days of discomfort.  This is most consistent with musculoskeletal pain discomfort.  Does appear stable and appropriate for outpatient follow-up. [PR]    Clinical Course User Index [PR] Willy Eddy, MD     FINAL CLINICAL IMPRESSION(S) / ED DIAGNOSES   Final diagnoses:  Acute pain of left shoulder     Rx / DC Orders   ED Discharge Orders          Ordered    cyclobenzaprine (FLEXERIL) 10 MG tablet  3 times daily PRN        10/10/22 1146    lidocaine (LIDODERM) 5 %  Every 12 hours        10/10/22 1146             Note:  This document was prepared using Dragon voice recognition software and may include unintentional dictation errors.    Willy Eddy, MD 10/10/22 1147

## 2022-10-10 NOTE — ED Triage Notes (Signed)
Pt presents to ED with c/o of L shoulder pain that started yesterday. Pt denies injury to this shoulder but does state she was washing her windshield. NAD noted. Pt denies cardiac HX.

## 2022-10-18 ENCOUNTER — Emergency Department: Payer: Medicaid Other

## 2022-10-18 ENCOUNTER — Encounter: Payer: Self-pay | Admitting: Emergency Medicine

## 2022-10-18 ENCOUNTER — Other Ambulatory Visit: Payer: Self-pay

## 2022-10-18 ENCOUNTER — Emergency Department
Admission: EM | Admit: 2022-10-18 | Discharge: 2022-10-18 | Disposition: A | Discharge status: Home / Self Care | Payer: Medicaid Other | Attending: Emergency Medicine | Admitting: Emergency Medicine

## 2022-10-18 DIAGNOSIS — S8392XA Sprain of unspecified site of left knee, initial encounter: Secondary | ICD-10-CM | POA: Insufficient documentation

## 2022-10-18 DIAGNOSIS — S8992XA Unspecified injury of left lower leg, initial encounter: Secondary | ICD-10-CM

## 2022-10-18 DIAGNOSIS — I10 Essential (primary) hypertension: Secondary | ICD-10-CM | POA: Diagnosis not present

## 2022-10-18 DIAGNOSIS — X501XXA Overexertion from prolonged static or awkward postures, initial encounter: Secondary | ICD-10-CM | POA: Insufficient documentation

## 2022-10-18 DIAGNOSIS — M25562 Pain in left knee: Secondary | ICD-10-CM | POA: Diagnosis not present

## 2022-10-18 DIAGNOSIS — M1712 Unilateral primary osteoarthritis, left knee: Secondary | ICD-10-CM | POA: Diagnosis not present

## 2022-10-18 MED ORDER — ACETAMINOPHEN 500 MG PO TABS
1000.0000 mg | ORAL_TABLET | Freq: Once | ORAL | Status: AC
  Administered 2022-10-18: 1000 mg via ORAL
  Filled 2022-10-18: qty 2

## 2022-10-18 MED ORDER — IBUPROFEN 600 MG PO TABS
600.0000 mg | ORAL_TABLET | Freq: Once | ORAL | Status: AC
  Administered 2022-10-18: 600 mg via ORAL
  Filled 2022-10-18: qty 1

## 2022-10-18 NOTE — ED Triage Notes (Signed)
First nurse note: pt to ED [facility] from [facility] for left knee pain began yesterday after being in pool. States felt something pop in left knee while walking this am.  20g to R hand. 4mg  zofran, fentanyl PTA VSS

## 2022-10-18 NOTE — ED Triage Notes (Signed)
Patient to ED via [facility] for left knee pain. Patient states she heard a "pop" in knee this AM while loading car.

## 2022-10-18 NOTE — Discharge Instructions (Signed)
Call Dr. Ernest Pine of orthopedic surgery for follow-up appointment this week.  Fortunately your x-ray did not show any broken bones or dislocations.  I think you have a ligamentous injury.  Keep your knee in the immobilizer and use crutches when walking.  Take acetaminophen 650 mg and ibuprofen 400 mg every 6 hours for pain.  Take with food.   Thank you for choosing [place] for your health care today!  Please see your primary doctor this week for a follow up appointment.   If you have any new, worsening, or unexpected symptoms call your doctor right away or come back to the emergency department for reevaluation.  It was my pleasure to care for you today.   Daneil Dan Modesto Charon, MD

## 2022-10-18 NOTE — ED Provider Notes (Signed)
[facility] Provider Note    Event Date/Time   First MD Initiated Contact with Patient 10/18/22 1106     (approximate)   History   Knee Pain   HPI  Alexa Newman is a 61 y.o. female   Past medical history of osteoarthritis hypertension, prediabetic who presents to the emergency department with L knee  injury.  Sprained left knee while getting out of pool yesterday.  Minor pain then was able to walk.  Felt a pop today while getting to the car.  No direct trauma.  Able to walk with pain.   No other injuries noted.     Physical Exam   Triage Vital Signs: ED Triage Vitals  Encounter Vitals Group     BP 10/18/22 1102 (!) 141/80     Systolic BP Percentile --      Diastolic BP Percentile --      Pulse Rate 10/18/22 1102 90     Resp 10/18/22 1102 18     Temp 10/18/22 1102 98.1 F (36.7 C)     Temp Source 10/18/22 1102 Oral     SpO2 10/18/22 1102 96 %     Weight 10/18/22 1101 190 lb (86.2 kg)     Height 10/18/22 1101 5\' 3"  (1.6 m)     Head Circumference --      Peak Flow --      Pain Score 10/18/22 1101 7     Pain Loc --      Pain Education --      Exclude from Growth Chart --     Most recent vital signs: Vitals:   10/18/22 1102  BP: (!) 141/80  Pulse: 90  Resp: 18  Temp: 98.1 F (36.7 C)  SpO2: 96%    General: Awake, no distress.  CV:  Good peripheral perfusion.  Resp:  Normal effort.  Abd:  No distention.  Other:  No significant swelling warmth or erythema to the knee.  No tenderness to palpation on the anterior posterior medial lateral sides.  No laxity on stressing in all directions though she does state increased pain when I try to test anterior laxity.   ED Results / Procedures / Treatments   Labs (all labs ordered are listed, but only abnormal results are displayed) Labs Reviewed - No data to display  RADIOLOGY I independently reviewed and interpreted x-ray of the see no obvious fractures or dislocation I also  reviewed radiologist's formal read.   PROCEDURES:  Critical Care performed: No  Procedures   MEDICATIONS ORDERED IN ED: Medications  acetaminophen (TYLENOL) tablet 1,000 mg (1,000 mg Oral Given 10/18/22 1202)  ibuprofen (ADVIL) tablet 600 mg (600 mg Oral Given 10/18/22 1202)   IMPRESSION / MDM / ASSESSMENT AND PLAN / ED COURSE  I reviewed the triage vital signs and the nursing notes.                                Patient's presentation is most consistent with acute presentation with potential threat to life or bodily function.  Differential diagnosis includes, but is not limited to, fracture, dislocation, septic joint, ligamentous injury meniscal injury   The patient is on the cardiac monitor to evaluate for evidence of arrhythmia and/or significant heart rate changes.  MDM: Exam is consistent with ligamentous injury, soft tissue injury.  No direct trauma to suggest fracture dislocation and x-rays negative.  Does not appear to  be a septic joint.  Given any immobilizer, anticipatory guidance, she will follow-up with her established orthopedist.      FINAL CLINICAL IMPRESSION(S) / ED DIAGNOSES   Final diagnoses:  Left knee injury, initial encounter     Rx / DC Orders   ED Discharge Orders     None        Note:  This document was prepared using Dragon voice recognition software and may include unintentional dictation errors.    Pilar Jarvis, MD 10/18/22 [phone]

## 2022-10-20 DIAGNOSIS — S83012A Lateral subluxation of left patella, initial encounter: Secondary | ICD-10-CM | POA: Diagnosis not present

## 2022-10-20 DIAGNOSIS — M1712 Unilateral primary osteoarthritis, left knee: Secondary | ICD-10-CM | POA: Diagnosis not present

## 2022-10-24 ENCOUNTER — Other Ambulatory Visit: Payer: Self-pay | Admitting: Psychiatry

## 2022-10-24 DIAGNOSIS — F3131 Bipolar disorder, current episode depressed, mild: Secondary | ICD-10-CM

## 2022-11-03 DIAGNOSIS — M1712 Unilateral primary osteoarthritis, left knee: Secondary | ICD-10-CM | POA: Diagnosis not present

## 2022-11-03 DIAGNOSIS — S83012A Lateral subluxation of left patella, initial encounter: Secondary | ICD-10-CM | POA: Diagnosis not present

## 2022-11-03 DIAGNOSIS — Z6832 Body mass index (BMI) 32.0-32.9, adult: Secondary | ICD-10-CM | POA: Diagnosis not present

## 2022-11-13 ENCOUNTER — Other Ambulatory Visit: Payer: Self-pay | Admitting: Medical Genetics

## 2022-11-13 DIAGNOSIS — Z006 Encounter for examination for normal comparison and control in clinical research program: Secondary | ICD-10-CM

## 2022-11-17 ENCOUNTER — Encounter: Payer: Self-pay | Admitting: Psychiatry

## 2022-11-17 ENCOUNTER — Encounter: Payer: Self-pay | Admitting: Gastroenterology

## 2022-11-17 ENCOUNTER — Other Ambulatory Visit: Payer: Self-pay

## 2022-11-17 ENCOUNTER — Ambulatory Visit (INDEPENDENT_AMBULATORY_CARE_PROVIDER_SITE_OTHER): Payer: Medicaid Other | Admitting: Psychiatry

## 2022-11-17 VITALS — BP 148/87 | HR 102 | Temp 98.1°F | Ht 63.0 in | Wt 190.4 lb

## 2022-11-17 DIAGNOSIS — Z8659 Personal history of other mental and behavioral disorders: Secondary | ICD-10-CM | POA: Diagnosis not present

## 2022-11-17 DIAGNOSIS — F3176 Bipolar disorder, in full remission, most recent episode depressed: Secondary | ICD-10-CM | POA: Diagnosis not present

## 2022-11-17 DIAGNOSIS — F17200 Nicotine dependence, unspecified, uncomplicated: Secondary | ICD-10-CM | POA: Diagnosis not present

## 2022-11-17 DIAGNOSIS — F1421 Cocaine dependence, in remission: Secondary | ICD-10-CM

## 2022-11-17 DIAGNOSIS — F5105 Insomnia due to other mental disorder: Secondary | ICD-10-CM | POA: Diagnosis not present

## 2022-11-17 DIAGNOSIS — F411 Generalized anxiety disorder: Secondary | ICD-10-CM | POA: Diagnosis not present

## 2022-11-17 NOTE — Progress Notes (Signed)
BH MD OP Progress Note  11/17/2022 4:33 PM Alexa Newman  MRN:  [id number]  Chief Complaint:  Chief Complaint  Patient presents with   Follow-up   Depression   Manic Behavior   Anxiety   Medication Refill   HPI: Alexa Newman is a 62 year old Caucasian female, divorced, on SSI, lives in [place], has a history of bipolar disorder, borderline personality disorder, cocaine use disorder, GAD, insomnia, tobacco use disorder was evaluated in office today.  Patient today reports she recently was doing household chores and all of a sudden heard a 'pop' sound on her left sided knee joint.  She went to the emergency department and later on followed up with orthopedic provider.  She was diagnosed with subluxation of her knee joint and currently wears a knee brace.  Patient reports she is also on hydrocodone/acetaminophen for pain however she has been limiting use.  She reports the medication does help her to sleep better since sleep was affected by the pain.  She however has also been using the doxepin and that does help when she does not use her pain medication.  She stopped taking the Abilify since she felt nauseous on it.  She reports she feels good with regards to her mood symptoms.  Denies any significant depression, mania or hypomanic symptoms.  She is not interested in addition of a new medication or change of any of her medications at this time since she is doing better.  She is currently compliant on the Lamictal.  Denies side effects.  Patient denies any suicidality, homicidality or perceptual disturbances.  She has cut back on smoking cigarettes, a pack of cigarettes has lasted 3 weeks for her.  Patient denies any other concerns today.  Visit Diagnosis:    ICD-10-CM   1. Bipolar disorder, in full remission, most recent episode depressed (HCC)  F31.76    Type I    2. GAD (generalized anxiety disorder)  F41.1     3. Insomnia due to mental condition  F51.05    Knee pain     4. Tobacco use disorder  F17.200     5. Hx of borderline personality disorder  Z86.59     6. Cocaine use disorder, moderate, in sustained remission (HCC)  F14.21       Past Psychiatric History: I have reviewed past psychiatric history from progress note on 07/27/2017.  Past Medical History:  Past Medical History:  Diagnosis Date   Anxiety    Arthritis    Bipolar 1 disorder (HCC)    Depression    Family history of adverse reaction to anesthesia    half sister claims that she would stop breathing during surgery x 2   Hypertension    Osteoarthritis    PONV (postoperative nausea and vomiting)    Pre-diabetes     Past Surgical History:  Procedure Laterality Date   ANTERIOR CRUCIATE LIGAMENT (ACL) REVISION Right    CARPAL TUNNEL RELEASE     COLONOSCOPY     COLONOSCOPY WITH PROPOFOL N/A 11/17/2017   Procedure: COLONOSCOPY WITH PROPOFOL;  Surgeon: Pasty Spillers, MD;  Location: [facility];  Service: Endoscopy;  Laterality: N/A;   DILATION AND CURETTAGE OF UTERUS  2013   for heavy bleeding   ENDOMETRIAL ABLATION  2013   KNEE ARTHROPLASTY Right 10/02/2020   Procedure: COMPUTER ASSISTED TOTAL KNEE ARTHROPLASTY;  Surgeon: Donato Heinz, MD;  Location: [facility] ORS;  Service: Orthopedics;  Laterality: Right;   right wrist surgery  TUBAL LIGATION     WRIST ARTHROSCOPY Left 1990   for degenerative changes    Family Psychiatric History: I have reviewed family psychiatric history from progress note on 07/27/2017.  Family History:  Family History  Problem Relation Age of Onset   Diabetes Mother    Depression Mother    Hypertension Mother    Crohn's disease Mother    Lung cancer Mother 78       former smoker   Congestive Heart Failure Father    Breast cancer Sister 51   Alcohol abuse Sister    Drug abuse Sister    Anxiety disorder Sister    Depression Sister    Bipolar disorder Sister    Alcohol abuse Brother    Drug abuse Brother    Anxiety disorder Brother     Depression Brother    Colon polyps Brother    HIV Brother    Alcohol abuse Brother    Drug abuse Brother    Anxiety disorder Brother    Depression Brother    Colon polyps Brother    HIV Brother    Skin cancer Maternal Grandfather    Colon cancer Neg Hx    Ovarian cancer Neg Hx    Cervical cancer Neg Hx     Social History: I have reviewed social history from progress note on 07/27/2017. Social History   Socioeconomic History   Marital status: Divorced    Spouse name: Not on file   Number of children: 1   Years of education: Not on file   Highest education level: Some college, no degree  Occupational History   Occupation: disabled    Comment: on SSI awaiting decision about disability  Tobacco Use   Smoking status: Some Days    Types: Cigarettes   Smokeless tobacco: Never   Tobacco comments:    Currently smokes 1 cig every few days  Vaping Use   Vaping status: Former  Substance and Sexual Activity   Alcohol use: Yes    Comment: 1 times per month   Drug use: Not Currently    Comment: former crack cocaine user   Sexual activity: Yes    Partners: Male    Birth control/protection: Post-menopausal, Surgical  Other Topics Concern   Not on file  Social History Narrative   Live with brother and his husband   Social Determinants of Health   Financial Resource Strain: High Risk (07/27/2017)   Overall Financial Resource Strain (CARDIA)    Difficulty of Paying Living Expenses: Hard  Food Insecurity: Food Insecurity Present (07/27/2017)   Hunger Vital Sign    Worried About Running Out of Food in the Last Year: Often true    Ran Out of Food in the Last Year: Often true  Transportation Needs: Unmet Transportation Needs (07/27/2017)   PRAPARE - Administrator, Civil Service (Medical): Yes    Lack of Transportation (Non-Medical): Yes  Physical Activity: Insufficiently Active (07/27/2017)   Exercise Vital Sign    Days of Exercise per Week: 4 days    Minutes of  Exercise per Session: 20 min  Stress: Stress Concern Present (07/27/2017)   [employer] of Occupational Health - Occupational Stress Questionnaire    Feeling of Stress : Very much  Social Connections: Somewhat Isolated (07/27/2017)   Social Connection and Isolation Panel [NHANES]    Frequency of Communication with Friends and Family: More than three times a week    Frequency of Social Gatherings with Friends and Family:  Once a week    Attends Religious Services: Never    Active Member of Clubs or Organizations: No    Attends Banker Meetings: Never    Marital Status: Married    Allergies:  Allergies  Allergen Reactions   Pregabalin Other (See Comments)    Other reaction(s): Other (See Comments) Suicidal ideation  Suicidal ideation    Metabolic Disorder Labs: Lab Results  Component Value Date   HGBA1C 6.2 (H) 10/05/2022   No results found for: "PROLACTIN" Lab Results  Component Value Date   CHOL 168 10/05/2022   TRIG 232 (H) 10/05/2022   HDL 59 10/05/2022   CHOLHDL 2.6 12/02/2020   LDLCALC 71 10/05/2022   LDLCALC 68 12/02/2020   Lab Results  Component Value Date   TSH 0.661 10/05/2022   TSH 0.801 12/02/2020    Therapeutic Level Labs: No results found for: "LITHIUM" No results found for: "VALPROATE" No results found for: "CBMZ"  Current Medications: Current Outpatient Medications  Medication Sig Dispense Refill   amLODipine (NORVASC) 10 MG tablet TAKE 1 TABLET BY MOUTH EVERY DAY 90 tablet 0   calcium-vitamin D (OSCAL WITH D) 500-5 MG-MCG tablet Take 1 tablet by mouth.     celecoxib (CELEBREX) 100 MG capsule Take 1 capsule (100 mg total) by mouth 2 (two) times daily. 60 capsule 2   cyclobenzaprine (FLEXERIL) 10 MG tablet Take 1 tablet (10 mg total) by mouth 3 (three) times daily as needed for muscle spasms. 12 tablet 0   doxepin (SINEQUAN) 10 MG capsule TAKE 1 CAPSULE (10 MG TOTAL) BY MOUTH AT BEDTIME AS NEEDED. FOR SLEEP 90 capsule 0    HYDROcodone-acetaminophen (NORCO/VICODIN) 5-325 MG tablet Take 1 tablet by mouth every 6 (six) hours as needed.     hydrOXYzine (ATARAX) 10 MG tablet TAKE 1 TABLET BY MOUTH 3 TIMES DAILY AS NEEDED FOR ANXIETY. 270 tablet 0   lamoTRIgine (LAMICTAL) 100 MG tablet TAKE 1 TABLET BY MOUTH TWICE A DAY 180 tablet 0   lidocaine (LIDODERM) 5 % Place 1 patch onto the skin every 12 (twelve) hours. Remove & Discard patch within 12 hours or as directed by MD 10 patch 0   methocarbamol (ROBAXIN) 500 MG tablet Take 500 mg by mouth 2 (two) times daily.     nicotine (NICODERM CQ - DOSED IN MG/24 HOURS) 21 mg/24hr patch PLACE 1 PATCH ONTO THE SKIN DAILY. 28 patch 3   rosuvastatin (CRESTOR) 5 MG tablet TAKE 1 TABLET BY MOUTH EVERY OTHER DAY. PLEASE SCHEDULE OFFICE VISIT BEFORE ANY FUTURE REFILL 90 tablet 1   triamterene-hydrochlorothiazide (MAXZIDE-25) 37.5-25 MG tablet TAKE 1 TABLET BY MOUTH EVERY DAY 90 tablet 0   UNABLE TO FIND Take 1 tablet by mouth in the morning and at bedtime. Med Name: calcium, magnesium and zinc     No current facility-administered medications for this visit.     Musculoskeletal: Strength & Muscle Tone:  ok , except left sided LE in a brace Gait & Station: unsteady uses  Patient leans: N/A  Psychiatric Specialty Exam: Review of Systems  Musculoskeletal:        Knee joint  subluxation , left sided in a knee brace  Psychiatric/Behavioral:  Positive for sleep disturbance.        Mood swings    Blood pressure (!) 148/87, pulse (!) 102, temperature 98.1 F (36.7 C), temperature source Skin, height 5\' 3"  (1.6 m), weight 190 lb 6.4 oz (86.4 kg), last menstrual period 12/07/2011.Body mass index is 33.73  kg/m.  General Appearance: Casual  Eye Contact:  Fair  Speech:  Clear and Coherent  Volume:  Normal  Mood:   mood swings - improved  Affect:  Appropriate  Thought Process:  Goal Directed and Descriptions of Associations: Intact  Orientation:  Full (Time, Place, and Person)  Thought  Content: Logical   Suicidal Thoughts:  No  Homicidal Thoughts:  No  Memory:  Immediate;   Fair Recent;   Fair Remote;   Fair  Judgement:  Fair  Insight:  Fair  Psychomotor Activity:  Normal  Concentration:  Concentration: Fair and Attention Span: Fair  Recall:  [employer] of Knowledge: Fair  Language: Fair  Akathisia:  No  Handed:  Right  AIMS (if indicated): not done  Assets:  Communication Skills Desire for Improvement Housing Social Support  ADL's:  Intact  Cognition: WNL  Sleep:   improving   Screenings: AIMS    Flowsheet Row Video Visit from 10/16/2021 in [facility] Video Visit from 07/17/2021 in [facility]  AIMS Total Score 0 0      GAD-7    Flowsheet Row Office Visit from 10/01/2022 in [facility] Video Visit from 10/16/2021 in [facility] Video Visit from 07/17/2021 in [facility] Video Visit from 06/19/2021 in [facility] Office Visit from 01/14/2021 in [facility]  Total GAD-7 Score 7 13 2 13 1       PHQ2-9    Flowsheet Row Office Visit from 10/05/2022 in [facility] Office Visit from 10/01/2022 in [facility] Video Visit from 05/28/2022 in [facility] Office Visit from 03/13/2022 in [facility] Video Visit from 11/24/2021 in [facility]  PHQ-2 Total Score 3 0 0 1 0  PHQ-9 Total Score 8 6 -- 3 --      Flowsheet Row Office Visit from 11/17/2022 in [facility] ED from 10/18/2022 in [facility] ED from 10/10/2022 in [facility]  Department at [facility]  C-SSRS RISK CATEGORY Low Risk No Risk No Risk        Assessment and Plan: SOPHIAH PAYES is a 62 year old Caucasian female, divorced, on SSI, lives in [place], has a history of bipolar disorder, borderline personality disorder, insomnia was evaluated in office today.  Patient with side effects to Abilify currently noncompliant however reports mood symptoms are stable, will benefit from the following plan.  Plan Bipolar disorder type I depressed mild in remission Lamotrigine 100 mg p.o. twice daily Discontinue Abilify for side effects and noncompliance.  GAD-improving Hydroxyzine 10 mg p.o. 3 times daily as needed We will consider an SSRI in the future as needed Patient encouraged to establish care with therapist as well as practice mindfulness techniques.  Insomnia-restless due to pain Patient currently has knee pain, currently has braces as well as pain medications available. Continue doxepin 10 mg p.o. nightly as needed  Tobacco use disorder-improving Patient to continue to cut back.     Consent: Patient/Guardian gives verbal consent for treatment and assignment of benefits for services provided during this visit. Patient/Guardian expressed understanding and agreed to proceed.   Follow-up in clinic in 3 months or sooner if needed.  This note was generated in part or  whole with voice recognition software. Voice recognition is usually quite accurate but there are transcription errors that can and very often do occur. I apologize for any typographical errors that were not detected and corrected.    Jomarie Longs, MD 11/17/2022, 4:33 PM

## 2022-11-19 DIAGNOSIS — S83002D Unspecified subluxation of left patella, subsequent encounter: Secondary | ICD-10-CM | POA: Diagnosis not present

## 2022-11-24 ENCOUNTER — Ambulatory Visit
Admission: RE | Admit: 2022-11-24 | Discharge: 2022-11-24 | Disposition: A | Discharge status: Home / Self Care | Payer: Medicaid Other | Source: Ambulatory Visit | Attending: Gastroenterology | Admitting: Gastroenterology

## 2022-11-24 ENCOUNTER — Encounter: Payer: Self-pay | Admitting: Gastroenterology

## 2022-11-24 ENCOUNTER — Ambulatory Visit: Payer: Medicaid Other | Admitting: General Practice

## 2022-11-24 ENCOUNTER — Encounter
Admission: RE | Disposition: A | Discharge status: Home / Self Care | Payer: Self-pay | Source: Ambulatory Visit | Attending: Gastroenterology

## 2022-11-24 DIAGNOSIS — Z5982 Transportation insecurity: Secondary | ICD-10-CM | POA: Insufficient documentation

## 2022-11-24 DIAGNOSIS — Z5986 Financial insecurity: Secondary | ICD-10-CM | POA: Insufficient documentation

## 2022-11-24 DIAGNOSIS — F17200 Nicotine dependence, unspecified, uncomplicated: Secondary | ICD-10-CM | POA: Diagnosis not present

## 2022-11-24 DIAGNOSIS — Z1211 Encounter for screening for malignant neoplasm of colon: Secondary | ICD-10-CM | POA: Diagnosis not present

## 2022-11-24 DIAGNOSIS — Z86010 Personal history of colonic polyps: Secondary | ICD-10-CM | POA: Insufficient documentation

## 2022-11-24 DIAGNOSIS — J449 Chronic obstructive pulmonary disease, unspecified: Secondary | ICD-10-CM | POA: Diagnosis not present

## 2022-11-24 DIAGNOSIS — F319 Bipolar disorder, unspecified: Secondary | ICD-10-CM | POA: Insufficient documentation

## 2022-11-24 DIAGNOSIS — F17210 Nicotine dependence, cigarettes, uncomplicated: Secondary | ICD-10-CM | POA: Diagnosis not present

## 2022-11-24 DIAGNOSIS — I10 Essential (primary) hypertension: Secondary | ICD-10-CM | POA: Diagnosis not present

## 2022-11-24 DIAGNOSIS — Z09 Encounter for follow-up examination after completed treatment for conditions other than malignant neoplasm: Secondary | ICD-10-CM | POA: Diagnosis not present

## 2022-11-24 DIAGNOSIS — F418 Other specified anxiety disorders: Secondary | ICD-10-CM | POA: Diagnosis not present

## 2022-11-24 HISTORY — PX: COLONOSCOPY WITH PROPOFOL: SHX5780

## 2022-11-24 SURGERY — COLONOSCOPY WITH PROPOFOL
Anesthesia: General

## 2022-11-24 MED ORDER — LIDOCAINE HCL (PF) 2 % IJ SOLN
INTRAMUSCULAR | Status: AC
  Filled 2022-11-24: qty 5

## 2022-11-24 MED ORDER — PROPOFOL 500 MG/50ML IV EMUL
INTRAVENOUS | Status: DC | PRN
  Administered 2022-11-24: 150 ug/kg/min via INTRAVENOUS

## 2022-11-24 MED ORDER — PROPOFOL 1000 MG/100ML IV EMUL
INTRAVENOUS | Status: AC
  Filled 2022-11-24: qty 100

## 2022-11-24 MED ORDER — LIDOCAINE HCL (CARDIAC) PF 100 MG/5ML IV SOSY
PREFILLED_SYRINGE | INTRAVENOUS | Status: DC | PRN
  Administered 2022-11-24: 50 mg via INTRAVENOUS

## 2022-11-24 MED ORDER — SODIUM CHLORIDE 0.9 % IV SOLN: INTRAVENOUS | Status: DC

## 2022-11-24 MED ORDER — PROPOFOL 10 MG/ML IV BOLUS
INTRAVENOUS | Status: DC | PRN
  Administered 2022-11-24: 60 mg via INTRAVENOUS

## 2022-11-24 NOTE — Anesthesia Procedure Notes (Signed)
Procedure Name: MAC Date/Time: 11/24/2022 7:50 AM  Performed by: Elmarie Mainland, CRNAPre-anesthesia Checklist: Patient identified, Emergency Drugs available, Suction available and Patient being monitored Patient Re-evaluated:Patient Re-evaluated prior to induction Oxygen Delivery Method: Nasal cannula

## 2022-11-24 NOTE — Anesthesia Preprocedure Evaluation (Signed)
Anesthesia Evaluation  Patient identified by MRN, date of birth, ID band Patient awake    Reviewed: Allergy & Precautions, H&P , NPO status , Patient's Chart, lab work & pertinent test results  History of Anesthesia Complications (+) PONV and history of anesthetic complications  Airway Mallampati: III  TM Distance: >3 FB Neck ROM: limited    Dental  (+) Chipped, Poor Dentition, Upper Dentures, Dental Advidsory Given   Pulmonary neg shortness of breath, COPD, Current Smoker          Cardiovascular Exercise Tolerance: Good hypertension, (-) angina (-) Past MI and (-) DOE      Neuro/Psych  PSYCHIATRIC DISORDERS Anxiety Depression Bipolar Disorder   negative neurological ROS     GI/Hepatic negative GI ROS, Neg liver ROS,neg GERD  ,,  Endo/Other  negative endocrine ROS    Renal/GU negative Renal ROS  negative genitourinary   Musculoskeletal  (+) Arthritis ,    Abdominal   Peds  Hematology negative hematology ROS (+)   Anesthesia Other Findings Past Medical History: No date: Anxiety No date: Arthritis No date: Bipolar 1 disorder (HCC) No date: Depression No date: Osteoarthritis  Past Surgical History: No date: ANTERIOR CRUCIATE LIGAMENT (ACL) REVISION; Right 2013: DILATION AND CURETTAGE OF UTERUS     Comment:  for heavy bleeding 2013: ENDOMETRIAL ABLATION No date: TUBAL LIGATION 1990: WRIST ARTHROSCOPY; Left     Comment:  for degenerative changes     Reproductive/Obstetrics negative OB ROS                             Anesthesia Physical Anesthesia Plan  ASA: 3  Anesthesia Plan: General   Post-op Pain Management:    Induction: Intravenous  PONV Risk Score and Plan: Propofol infusion, TIVA and Ondansetron  Airway Management Planned: Natural Airway and Nasal Cannula  Additional Equipment:   Intra-op Plan:   Post-operative Plan:   Informed Consent: I have reviewed the  patients History and Physical, chart, labs and discussed the procedure including the risks, benefits and alternatives for the proposed anesthesia with the patient or authorized representative who has indicated his/her understanding and acceptance.     Dental Advisory Given  Plan Discussed with: Anesthesiologist, CRNA and Surgeon  Anesthesia Plan Comments: (Patient consented for risks of anesthesia including but not limited to:  - adverse reactions to medications - risk of intubation if required - damage to teeth, lips or other oral mucosa - sore throat or hoarseness - Damage to heart, brain, lungs or loss of life  Patient voiced understanding.)        Anesthesia Quick Evaluation

## 2022-11-24 NOTE — Anesthesia Postprocedure Evaluation (Signed)
Anesthesia Post Note  Patient: Alexa Newman  Procedure(s) Performed: COLONOSCOPY WITH PROPOFOL  Patient location during evaluation: PACU Anesthesia Type: General Level of consciousness: awake and alert Pain management: pain level controlled Vital Signs Assessment: post-procedure vital signs reviewed and stable Respiratory status: spontaneous breathing, nonlabored ventilation, respiratory function stable and patient connected to nasal cannula oxygen Cardiovascular status: blood pressure returned to baseline and stable Postop Assessment: no apparent nausea or vomiting Anesthetic complications: no  No notable events documented.   Last Vitals:  Vitals:   11/24/22 0759 11/24/22 0809  BP: 107/73 126/76  Pulse:    Resp:    Temp: (!) 35.8 C   SpO2:      Last Pain:  Vitals:   11/24/22 0819  TempSrc:   PainSc: 0-No pain                 Stephanie Coup

## 2022-11-24 NOTE — Transfer of Care (Signed)
Immediate Anesthesia Transfer of Care Note  Patient: Alexa Newman  Procedure(s) Performed: COLONOSCOPY WITH PROPOFOL  Patient Location: PACU and Endoscopy Unit  Anesthesia Type:General  Level of Consciousness: drowsy and patient cooperative  Airway & Oxygen Therapy: Patient Spontanous Breathing  Post-op Assessment: Report given to RN and Post -op Vital signs reviewed and stable  Post vital signs: Reviewed and stable  Last Vitals:  Vitals Value Taken Time  BP 107/73 (84)   Temp    Pulse 80 11/24/22 0758  Resp 21 11/24/22 0758  SpO2 94 % 11/24/22 0758  Vitals shown include unfiled device data.  Last Pain:  Vitals:   11/24/22 0705  TempSrc: Temporal         Complications: No notable events documented.

## 2022-11-24 NOTE — H&P (Signed)
Wyline Mood, MD [address], [place], [place], [address] [address], [address], [place], [place], [address] Phone: [phone]  Fax: [phone]  Primary Care Physician:  Erasmo Downer, MD   Pre-Procedure History & Physical: HPI:  Alexa Newman is a 62 y.o. female is here for an colonoscopy.   Past Medical History:  Diagnosis Date   Anxiety    Arthritis    Bipolar 1 disorder (HCC)    Depression    Family history of adverse reaction to anesthesia    half sister claims that she would stop breathing during surgery x 2   Hypertension    Osteoarthritis    PONV (postoperative nausea and vomiting)    Pre-diabetes     Past Surgical History:  Procedure Laterality Date   ANTERIOR CRUCIATE LIGAMENT (ACL) REVISION Right    CARPAL TUNNEL RELEASE     COLONOSCOPY     COLONOSCOPY WITH PROPOFOL N/A 11/17/2017   Procedure: COLONOSCOPY WITH PROPOFOL;  Surgeon: Pasty Spillers, MD;  Location: [facility] ENDOSCOPY;  Service: Endoscopy;  Laterality: N/A;   DILATION AND CURETTAGE OF UTERUS  2013   for heavy bleeding   ENDOMETRIAL ABLATION  2013   KNEE ARTHROPLASTY Right 10/02/2020   Procedure: COMPUTER ASSISTED TOTAL KNEE ARTHROPLASTY;  Surgeon: Donato Heinz, MD;  Location: [facility] ORS;  Service: Orthopedics;  Laterality: Right;   right wrist surgery     TUBAL LIGATION     WRIST ARTHROSCOPY Left 1990   for degenerative changes    Prior to Admission medications   Medication Sig Start Date End Date Taking? Authorizing Provider  amLODipine (NORVASC) 10 MG tablet TAKE 1 TABLET BY MOUTH EVERY DAY 09/14/22  Yes Bacigalupo, Marzella Schlein, MD  celecoxib (CELEBREX) 100 MG capsule Take 1 capsule (100 mg total) by mouth 2 (two) times daily. 10/05/22  Yes Bacigalupo, Marzella Schlein, MD  doxepin (SINEQUAN) 10 MG capsule TAKE 1 CAPSULE (10 MG TOTAL) BY MOUTH AT BEDTIME AS NEEDED. FOR SLEEP 02/09/22  Yes Eappen, Levin Bacon, MD  hydrOXYzine (ATARAX) 10 MG tablet TAKE 1 TABLET BY MOUTH 3 TIMES DAILY  AS NEEDED FOR ANXIETY. 11/10/21  Yes Eappen, Levin Bacon, MD  methocarbamol (ROBAXIN) 500 MG tablet Take 500 mg by mouth 2 (two) times daily.   Yes [provider]  rosuvastatin (CRESTOR) 5 MG tablet TAKE 1 TABLET BY MOUTH EVERY OTHER DAY. PLEASE SCHEDULE OFFICE VISIT BEFORE ANY FUTURE REFILL 10/05/22  Yes Erasmo Downer, MD  triamterene-hydrochlorothiazide (MAXZIDE-25) 37.5-25 MG tablet TAKE 1 TABLET BY MOUTH EVERY DAY 09/15/22  Yes Bacigalupo, Marzella Schlein, MD  calcium-vitamin D (OSCAL WITH D) 500-5 MG-MCG tablet Take 1 tablet by mouth.    [provider]  cyclobenzaprine (FLEXERIL) 10 MG tablet Take 1 tablet (10 mg total) by mouth 3 (three) times daily as needed for muscle spasms. 10/10/22   Willy Eddy, MD  HYDROcodone-acetaminophen (NORCO/VICODIN) 5-325 MG tablet Take 1 tablet by mouth every 6 (six) hours as needed. 11/03/22   [provider]  lamoTRIgine (LAMICTAL) 100 MG tablet TAKE 1 TABLET BY MOUTH TWICE A DAY 10/05/22   Eappen, Levin Bacon, MD  lidocaine (LIDODERM) 5 % Place 1 patch onto the skin every 12 (twelve) hours. Remove & Discard patch within 12 hours or as directed by MD 10/10/22 10/10/23  Willy Eddy, MD  nicotine (NICODERM CQ - DOSED IN MG/24 HOURS) 21 mg/24hr patch PLACE 1 PATCH ONTO THE SKIN DAILY. 07/06/22   Bacigalupo, Marzella Schlein, MD  UNABLE TO FIND Take  1 tablet by mouth in the morning and at bedtime. Med Name: calcium, magnesium and zinc    [provider]    Allergies as of 08/27/2022 - Review Complete 05/28/2022  Allergen Reaction Noted   Pregabalin Other (See Comments) 12/06/2017    Family History  Problem Relation Age of Onset   Diabetes Mother    Depression Mother    Hypertension Mother    Crohn's disease Mother    Lung cancer Mother 85       former smoker   Congestive Heart Failure Father    Breast cancer Sister 70   Alcohol abuse Sister    Drug abuse Sister    Anxiety disorder Sister    Depression Sister    Bipolar disorder  Sister    Alcohol abuse Brother    Drug abuse Brother    Anxiety disorder Brother    Depression Brother    Colon polyps Brother    HIV Brother    Alcohol abuse Brother    Drug abuse Brother    Anxiety disorder Brother    Depression Brother    Colon polyps Brother    HIV Brother    Skin cancer Maternal Grandfather    Colon cancer Neg Hx    Ovarian cancer Neg Hx    Cervical cancer Neg Hx     Social History   Socioeconomic History   Marital status: Divorced    Spouse name: Not on file   Number of children: 1   Years of education: Not on file   Highest education level: Some college, no degree  Occupational History   Occupation: disabled    Comment: on SSI awaiting decision about disability  Tobacco Use   Smoking status: Some Days    Types: Cigarettes   Smokeless tobacco: Never   Tobacco comments:    Currently smokes 1 cig every few days  Vaping Use   Vaping status: Former  Substance and Sexual Activity   Alcohol use: Yes    Comment: 1 times per month   Drug use: Not Currently    Comment: former crack cocaine user   Sexual activity: Yes    Partners: Male    Birth control/protection: Post-menopausal, Surgical  Other Topics Concern   Not on file  Social History Narrative   Live with brother and his husband   Social Determinants of Health   Financial Resource Strain: High Risk (07/27/2017)   Overall Financial Resource Strain (CARDIA)    Difficulty of Paying Living Expenses: Hard  Food Insecurity: Food Insecurity Present (07/27/2017)   Hunger Vital Sign    Worried About Running Out of Food in the Last Year: Often true    Ran Out of Food in the Last Year: Often true  Transportation Needs: Unmet Transportation Needs (07/27/2017)   PRAPARE - Administrator, Civil Service (Medical): Yes    Lack of Transportation (Non-Medical): Yes  Physical Activity: Insufficiently Active (07/27/2017)   Exercise Vital Sign    Days of Exercise per Week: 4 days    Minutes of  Exercise per Session: 20 min  Stress: Stress Concern Present (07/27/2017)   [employer] of Occupational Health - Occupational Stress Questionnaire    Feeling of Stress : Very much  Social Connections: Somewhat Isolated (07/27/2017)   Social Connection and Isolation Panel [NHANES]    Frequency of Communication with Friends and Family: More than three times a week    Frequency of Social Gatherings with Friends and Family: Once  a week    Attends Religious Services: Never    Active Member of Clubs or Organizations: No    Attends Banker Meetings: Never    Marital Status: Married  Catering manager Violence: Not At Risk (07/27/2017)   Humiliation, Afraid, Rape, and Kick questionnaire    Fear of Current or Ex-Partner: No    Emotionally Abused: No    Physically Abused: No    Sexually Abused: No    Review of Systems: See HPI, otherwise negative ROS  Physical Exam: BP 129/82   Pulse 83   Temp (!) 96.6 F (35.9 C) (Temporal)   Resp 18   Wt 83.9 kg   LMP 12/07/2011   SpO2 96%   BMI 32.77 kg/m  General:   Alert,  pleasant and cooperative in NAD Head:  Normocephalic and atraumatic. Neck:  Supple; no masses or thyromegaly. Lungs:  Clear throughout to auscultation, normal respiratory effort.    Heart:  +S1, +S2, Regular rate and rhythm, No edema. Abdomen:  Soft, nontender and nondistended. Normal bowel sounds, without guarding, and without rebound.   Neurologic:  Alert and  oriented x4;  grossly normal neurologically.  Impression/Plan: Pearlean Brownie is here for an colonoscopy to be performed for surveillance due to prior history of colon polyps   Risks, benefits, limitations, and alternatives regarding  colonoscopy have been reviewed with the patient.  Questions have been answered.  All parties agreeable.   Wyline Mood, MD  11/24/2022, 7:48 AM

## 2022-11-24 NOTE — Op Note (Signed)
[facility] Patient Name: Alexa Newman Procedure Date: 11/24/2022 7:19 AM MRN: [id number] Account #: [id number] Date of Birth: Jan 15, 1961 Admit Type: Outpatient Age: 62 Room: [facility] ENDO ROOM 2 Gender: Female Note Status: Finalized Instrument Name: Prentice Docker [id number] Procedure:             Colonoscopy Indications:           Surveillance: Personal history of adenomatous polyps                         on last colonoscopy 5 years ago Providers:             Wyline Mood MD, MD Referring MD:          Marzella Schlein. Bacigalupo (Referring MD) Medicines:             Monitored Anesthesia Care Complications:         No immediate complications. Procedure:             Pre-Anesthesia Assessment:                        - Prior to the procedure, a History and Physical was                         performed, and patient medications, allergies and                         sensitivities were reviewed. The patient's tolerance                         of previous anesthesia was reviewed.                        - The risks and benefits of the procedure and the                         sedation options and risks were discussed with the                         patient. All questions were answered and informed                         consent was obtained.                        - ASA Grade Assessment: II - A patient with mild                         systemic disease.                        After obtaining informed consent, the colonoscope was                         passed under direct vision. Throughout the procedure,                         the patient's blood pressure, pulse, and oxygen                         saturations  were monitored continuously. The                         Colonoscope was introduced through the anus with the                         intention of advancing to the cecum. The scope was                         advanced to the sigmoid colon before the procedure  was                         aborted. Medications were given. The colonoscopy was                         performed with ease. The patient tolerated the                         procedure well. The quality of the bowel preparation                         was inadequate. Findings:      The perianal and digital rectal examinations were normal.      A moderate amount of semi-solid stool was found in the recto-sigmoid       colon, interfering with visualization. Impression:            - Preparation of the colon was inadequate.                        - Stool in the recto-sigmoid colon.                        - No specimens collected. Recommendation:        - Discharge patient to home (with escort).                        - Resume previous diet.                        - Continue present medications.                        - Repeat colonoscopy in 4 weeks because the bowel                         preparation was suboptimal. Procedure Code(s):     --- Professional ---                        [phone], 53, Colonoscopy, flexible; diagnostic,                         including collection of specimen(s) by brushing or                         washing, when performed (separate procedure) Diagnosis Code(s):     --- Professional ---                        Z86.010, Personal history of colonic  polyps CPT copyright 2022 American Medical Association. All rights reserved. The codes documented in this report are preliminary and upon coder review may  be revised to meet current compliance requirements. Wyline Mood, MD Wyline Mood MD, MD 11/24/2022 7:56:07 AM This report has been signed electronically. Number of Addenda: 0 Note Initiated On: 11/24/2022 7:19 AM Total Procedure Duration: 0 hours 0 minutes 34 seconds  Estimated Blood Loss:  Estimated blood loss: none.      [facility]

## 2022-11-25 ENCOUNTER — Encounter: Payer: Self-pay | Admitting: Gastroenterology

## 2022-12-17 ENCOUNTER — Other Ambulatory Visit: Payer: Self-pay | Admitting: *Deleted

## 2022-12-17 ENCOUNTER — Telehealth: Payer: Self-pay | Admitting: *Deleted

## 2022-12-17 ENCOUNTER — Telehealth: Payer: Self-pay | Admitting: Gastroenterology

## 2022-12-17 DIAGNOSIS — Z860100 Personal history of colon polyps, unspecified: Secondary | ICD-10-CM

## 2022-12-17 DIAGNOSIS — Z1211 Encounter for screening for malignant neoplasm of colon: Secondary | ICD-10-CM

## 2022-12-17 NOTE — Telephone Encounter (Signed)
Colonoscopy schedule on 01/25/2023 with Dr Tobi Bastos

## 2022-12-17 NOTE — Telephone Encounter (Signed)
This is a reschedule from 11/24/2022. Patient was not cleaned out.  Requesting for the repeat on 01/25/2023.  [facility]  Instructions will sent out.

## 2022-12-17 NOTE — Telephone Encounter (Signed)
Message left for patient to return my call.  

## 2022-12-17 NOTE — Telephone Encounter (Signed)
Patient called in to reschedule her colonoscopy.

## 2022-12-18 ENCOUNTER — Other Ambulatory Visit: Payer: Self-pay | Admitting: Family Medicine

## 2022-12-18 NOTE — Telephone Encounter (Signed)
Requested Prescriptions  Pending Prescriptions Disp Refills   amLODipine (NORVASC) 10 MG tablet [Pharmacy Med Name: AMLODIPINE BESYLATE 10 MG TAB] 90 tablet 0    Sig: TAKE 1 TABLET BY MOUTH EVERY DAY     Cardiovascular: Calcium Channel Blockers 2 Passed - 12/18/2022  8:31 AM      Passed - Last BP in normal range    BP Readings from Last 1 Encounters:  11/24/22 126/76         Passed - Last Heart Rate in normal range    Pulse Readings from Last 1 Encounters:  11/24/22 83         Passed - Valid encounter within last 6 months    Recent Outpatient Visits           2 months ago Essential hypertension   [facility], Marzella Schlein, MD   9 months ago Encounter for annual health examination   [facility] [place], Marzella Schlein, MD   11 months ago Right hip pain   [facility] [place], Marzella Schlein, MD   1 year ago Essential hypertension   [place] [facility] [place], Marzella Schlein, MD   2 years ago Encounter for annual physical exam   [place] [facility] [place], Marzella Schlein, MD       Future Appointments             In 3 days Bacigalupo, Marzella Schlein, MD [facility]   In 3 months Bacigalupo, Marzella Schlein, MD [facility]

## 2022-12-21 ENCOUNTER — Telehealth: Payer: Self-pay | Admitting: Family Medicine

## 2022-12-21 ENCOUNTER — Ambulatory Visit: Payer: Medicaid Other | Admitting: Family Medicine

## 2022-12-21 ENCOUNTER — Encounter: Payer: Self-pay | Admitting: Family Medicine

## 2022-12-21 VITALS — BP 130/86 | HR 104 | Temp 98.3°F | Ht 63.0 in | Wt 192.3 lb

## 2022-12-21 DIAGNOSIS — Z23 Encounter for immunization: Secondary | ICD-10-CM

## 2022-12-21 DIAGNOSIS — Z6832 Body mass index (BMI) 32.0-32.9, adult: Secondary | ICD-10-CM

## 2022-12-21 DIAGNOSIS — R7303 Prediabetes: Secondary | ICD-10-CM

## 2022-12-21 DIAGNOSIS — E66811 Obesity, class 1: Secondary | ICD-10-CM | POA: Diagnosis not present

## 2022-12-21 MED ORDER — SEMAGLUTIDE-WEIGHT MANAGEMENT 0.5 MG/0.5ML ~~LOC~~ SOAJ: 0.5000 mg | SUBCUTANEOUS | 0 refills | Status: DC

## 2022-12-21 MED ORDER — SEMAGLUTIDE-WEIGHT MANAGEMENT 0.25 MG/0.5ML ~~LOC~~ SOAJ: 0.2500 mg | SUBCUTANEOUS | 0 refills | Status: DC

## 2022-12-21 MED ORDER — SEMAGLUTIDE-WEIGHT MANAGEMENT 1 MG/0.5ML ~~LOC~~ SOAJ: 1.0000 mg | SUBCUTANEOUS | 2 refills | Status: DC

## 2022-12-21 NOTE — Telephone Encounter (Signed)
Received a fax from covermymeds for Wegovy 0.5mg /0.17ml  Key: [id number]   Received a fax from covermymeds for Wegovy 1mg /0.11ml  Key: [id number]   Received a fax from covermymeds for Wegovy 0.25mg /0.41ml  Key: [id number]

## 2022-12-21 NOTE — Progress Notes (Signed)
Established Patient Office Visit  Subjective   Patient ID: GERI HEPLER, female    DOB: 1960-05-09  Age: 62 y.o. MRN: [id number]  Chief Complaint  Patient presents with   Acute Visit    Pt wants to discuss weight loss medication    HPI  Discussed the use of AI scribe software for clinical note transcription with the patient, who gave verbal consent to proceed.  History of Present Illness   The patient, with a history of prediabetes and smoking, presents with concerns about her weight. She expresses frustration with her current physical state, noting that she feels "miserable" and has low energy. She reports that her weight has made it difficult to breathe and has limited her physical activity, particularly after a recent knee injury. The patient also mentions a recent scare of a potential heart attack, which turned out to be a false alarm. She expresses a desire to lose weight, not for vanity, but for improved health and energy levels. The patient has tried intermittent fasting but found it unsustainable due to constant hunger. She has also tried shaping garments but found them unsatisfactory. The patient has a family member who has had success with weight loss medication, which has motivated her to explore this option.         ROS    Objective:     BP 130/86 (BP Location: Left Arm, Patient Position: Sitting, Cuff Size: Normal)   Pulse (!) 104   Temp 98.3 F (36.8 C) (Oral)   Ht 5\' 3"  (1.6 m)   Wt 192 lb 4.8 oz (87.2 kg)   LMP 12/07/2011   SpO2 95%   BMI 34.06 kg/m    Physical Exam Vitals reviewed.  Constitutional:      General: She is not in acute distress.    Appearance: Normal appearance. She is well-developed. She is not diaphoretic.  HENT:     Head: Normocephalic and atraumatic.  Eyes:     General: No scleral icterus.    Conjunctiva/sclera: Conjunctivae normal.  Neck:     Thyroid: No thyromegaly.  Cardiovascular:     Rate and Rhythm: Normal rate and  regular rhythm.     Heart sounds: Normal heart sounds. No murmur heard. Pulmonary:     Effort: Pulmonary effort is normal. No respiratory distress.     Breath sounds: Normal breath sounds. No wheezing, rhonchi or rales.  Musculoskeletal:     Cervical back: Neck supple.     Right lower leg: No edema.     Left lower leg: No edema.  Lymphadenopathy:     Cervical: No cervical adenopathy.  Skin:    General: Skin is warm and dry.     Findings: No rash.  Neurological:     Mental Status: She is alert and oriented to person, place, and time. Mental status is at baseline.  Psychiatric:        Mood and Affect: Mood normal.        Behavior: Behavior normal.      No results found for any visits on 12/21/22.    The 10-year ASCVD risk score (Arnett DK, et al., 2019) is: 8.7%    Assessment & Plan:   Problem List Items Addressed This Visit       Other   Obesity - Primary    BMI of 34, qualifying for weight loss medication. Discussed the benefits of weight loss for overall health, energy levels, and comorbid conditions. Patient has Medicaid which covers weight loss  medication. -Start Wegovy at 0.25mg  weekly for a month, then increase to 0.5mg  weekly, and then to 1mg  weekly. - continue lifestyle changes - diet and trying to be active -Check in 3 months to reassess weight loss and adjust medication as needed.      Relevant Medications   Semaglutide-Weight Management 0.25 MG/0.5ML SOAJ   Semaglutide-Weight Management 0.5 MG/0.5ML SOAJ (Start on 01/19/2023)   Semaglutide-Weight Management 1 MG/0.5ML SOAJ (Start on 02/17/2023)   Prediabetes   Other Visit Diagnoses     Need for influenza vaccination       Relevant Orders   Flu vaccine trivalent PF, 6mos and older(Flulaval,Afluria,Fluarix,Fluzone)           Tobacco Use Patient continues to smoke but expresses desire to quit. Discussed potential indirect benefits of weight loss medication on cravings. -Encourage continued efforts  to quit smoking.  General Health Maintenance -Administer influenza vaccine today.        Return in about 3 months (around 03/23/2023) for weight f/u, as scheduled.    Shirlee Latch, MD

## 2022-12-21 NOTE — Assessment & Plan Note (Signed)
BMI of 34, qualifying for weight loss medication. Discussed the benefits of weight loss for overall health, energy levels, and comorbid conditions. Patient has Medicaid which covers weight loss medication. -Start Wegovy at 0.25mg  weekly for a month, then increase to 0.5mg  weekly, and then to 1mg  weekly. - continue lifestyle changes - diet and trying to be active -Check in 3 months to reassess weight loss and adjust medication as needed.

## 2022-12-24 DIAGNOSIS — M5416 Radiculopathy, lumbar region: Secondary | ICD-10-CM | POA: Diagnosis not present

## 2022-12-25 NOTE — Telephone Encounter (Signed)
PA initiated for 0.25/0.5 mg [facility]

## 2023-01-05 DIAGNOSIS — G8929 Other chronic pain: Secondary | ICD-10-CM | POA: Diagnosis not present

## 2023-01-05 DIAGNOSIS — M5416 Radiculopathy, lumbar region: Secondary | ICD-10-CM | POA: Diagnosis not present

## 2023-01-05 DIAGNOSIS — M5442 Lumbago with sciatica, left side: Secondary | ICD-10-CM | POA: Diagnosis not present

## 2023-01-05 DIAGNOSIS — M5441 Lumbago with sciatica, right side: Secondary | ICD-10-CM | POA: Diagnosis not present

## 2023-01-08 ENCOUNTER — Telehealth: Payer: Self-pay

## 2023-01-08 NOTE — Telephone Encounter (Unsigned)
Copied from CRM [id number]. Topic: General - Other >> Jan 08, 2023 12:26 PM Turkey B wrote: Reason for CRM: pt called in says went to take wegovy shot and it leaked down her leg, so she is wanting to know should she wait to take another one

## 2023-01-11 NOTE — Telephone Encounter (Signed)
If she is confident that none went in, then she can do another one now. If any may have been injected, then wait until next one is due. If it mis-fired, she may be able to get a replacement dose by calling the company and reporting it.

## 2023-01-11 NOTE — Telephone Encounter (Signed)
Patient advised.

## 2023-01-13 ENCOUNTER — Telehealth: Payer: Self-pay

## 2023-01-13 NOTE — Telephone Encounter (Signed)
Patient called in to request her prep. [facility] [address] [place], [place] [address].

## 2023-01-14 MED ORDER — NA SULFATE-K SULFATE-MG SULF 17.5-3.13-1.6 GM/177ML PO SOLN: 1.0000 | Freq: Once | ORAL | 0 refills | Status: AC

## 2023-01-14 NOTE — Addendum Note (Signed)
Addended by: Tawnya Crook on: 01/14/2023 09:12 AM   Modules accepted: Orders

## 2023-01-14 NOTE — Telephone Encounter (Signed)
[facility]  Patient have been notified.

## 2023-01-18 ENCOUNTER — Other Ambulatory Visit: Payer: Self-pay | Admitting: Psychiatry

## 2023-01-18 DIAGNOSIS — F411 Generalized anxiety disorder: Secondary | ICD-10-CM

## 2023-01-20 ENCOUNTER — Other Ambulatory Visit: Payer: Self-pay | Admitting: Psychiatry

## 2023-01-20 DIAGNOSIS — F3131 Bipolar disorder, current episode depressed, mild: Secondary | ICD-10-CM

## 2023-01-21 ENCOUNTER — Telehealth: Payer: Self-pay | Admitting: Family Medicine

## 2023-01-21 DIAGNOSIS — E782 Mixed hyperlipidemia: Secondary | ICD-10-CM

## 2023-01-21 NOTE — Telephone Encounter (Signed)
[facility] is asking [place] to prescribe 90 pills of Rosuvastatin 5 mg. Instead of 15.  [facility] [place]

## 2023-01-22 NOTE — Telephone Encounter (Signed)
Ok to just reorder #[id number]

## 2023-01-25 ENCOUNTER — Ambulatory Visit: Payer: Medicaid Other | Admitting: Anesthesiology

## 2023-01-25 ENCOUNTER — Encounter
Admission: RE | Disposition: A | Discharge status: Home / Self Care | Payer: Self-pay | Source: Home / Self Care | Attending: Gastroenterology

## 2023-01-25 ENCOUNTER — Other Ambulatory Visit: Payer: Self-pay | Admitting: Family Medicine

## 2023-01-25 ENCOUNTER — Ambulatory Visit
Admission: RE | Admit: 2023-01-25 | Discharge: 2023-01-25 | Disposition: A | Discharge status: Home / Self Care | Payer: Medicaid Other | Attending: Gastroenterology | Admitting: Gastroenterology

## 2023-01-25 DIAGNOSIS — Z1211 Encounter for screening for malignant neoplasm of colon: Secondary | ICD-10-CM

## 2023-01-25 DIAGNOSIS — F17210 Nicotine dependence, cigarettes, uncomplicated: Secondary | ICD-10-CM | POA: Insufficient documentation

## 2023-01-25 DIAGNOSIS — Z8249 Family history of ischemic heart disease and other diseases of the circulatory system: Secondary | ICD-10-CM | POA: Diagnosis not present

## 2023-01-25 DIAGNOSIS — Z5986 Financial insecurity: Secondary | ICD-10-CM | POA: Diagnosis not present

## 2023-01-25 DIAGNOSIS — M1990 Unspecified osteoarthritis, unspecified site: Secondary | ICD-10-CM | POA: Diagnosis not present

## 2023-01-25 DIAGNOSIS — I10 Essential (primary) hypertension: Secondary | ICD-10-CM | POA: Insufficient documentation

## 2023-01-25 DIAGNOSIS — Z83719 Family history of colon polyps, unspecified: Secondary | ICD-10-CM | POA: Insufficient documentation

## 2023-01-25 DIAGNOSIS — F419 Anxiety disorder, unspecified: Secondary | ICD-10-CM | POA: Insufficient documentation

## 2023-01-25 DIAGNOSIS — D175 Benign lipomatous neoplasm of intra-abdominal organs: Secondary | ICD-10-CM | POA: Diagnosis not present

## 2023-01-25 DIAGNOSIS — D179 Benign lipomatous neoplasm, unspecified: Secondary | ICD-10-CM | POA: Diagnosis not present

## 2023-01-25 DIAGNOSIS — D126 Benign neoplasm of colon, unspecified: Secondary | ICD-10-CM

## 2023-01-25 DIAGNOSIS — Z860101 Personal history of adenomatous and serrated colon polyps: Secondary | ICD-10-CM | POA: Diagnosis present

## 2023-01-25 DIAGNOSIS — F319 Bipolar disorder, unspecified: Secondary | ICD-10-CM | POA: Diagnosis not present

## 2023-01-25 DIAGNOSIS — Z860100 Personal history of colon polyps, unspecified: Secondary | ICD-10-CM

## 2023-01-25 DIAGNOSIS — D124 Benign neoplasm of descending colon: Secondary | ICD-10-CM

## 2023-01-25 DIAGNOSIS — K635 Polyp of colon: Secondary | ICD-10-CM | POA: Diagnosis not present

## 2023-01-25 DIAGNOSIS — F17200 Nicotine dependence, unspecified, uncomplicated: Secondary | ICD-10-CM | POA: Diagnosis not present

## 2023-01-25 DIAGNOSIS — J449 Chronic obstructive pulmonary disease, unspecified: Secondary | ICD-10-CM | POA: Insufficient documentation

## 2023-01-25 HISTORY — PX: POLYPECTOMY: SHX5525

## 2023-01-25 HISTORY — PX: COLONOSCOPY WITH PROPOFOL: SHX5780

## 2023-01-25 SURGERY — COLONOSCOPY WITH PROPOFOL
Anesthesia: General

## 2023-01-25 MED ORDER — PROPOFOL 500 MG/50ML IV EMUL
INTRAVENOUS | Status: DC | PRN
  Administered 2023-01-25: 150 ug/kg/min via INTRAVENOUS

## 2023-01-25 MED ORDER — PROPOFOL 10 MG/ML IV BOLUS
INTRAVENOUS | Status: DC | PRN
  Administered 2023-01-25: 50 mg via INTRAVENOUS
  Administered 2023-01-25: 60 mg via INTRAVENOUS
  Administered 2023-01-25: 40 mg via INTRAVENOUS
  Administered 2023-01-25: 30 mg via INTRAVENOUS

## 2023-01-25 MED ORDER — SODIUM CHLORIDE 0.9 % IV SOLN: INTRAVENOUS | Status: DC

## 2023-01-25 MED ORDER — ROSUVASTATIN CALCIUM 5 MG PO TABS: 5.0000 mg | ORAL_TABLET | Freq: Every day | ORAL | 3 refills | Status: DC

## 2023-01-25 MED ORDER — LIDOCAINE HCL (CARDIAC) PF 100 MG/5ML IV SOSY
PREFILLED_SYRINGE | INTRAVENOUS | Status: DC | PRN
  Administered 2023-01-25: 20 mg via INTRAVENOUS

## 2023-01-25 NOTE — Anesthesia Preprocedure Evaluation (Addendum)
Anesthesia Evaluation  Patient identified by MRN, date of birth, ID band Patient awake    Reviewed: Allergy & Precautions, H&P , NPO status , Patient's Chart, lab work & pertinent test results  History of Anesthesia Complications (+) PONV and history of anesthetic complications  Airway Mallampati: III  TM Distance: >3 FB Neck ROM: limited    Dental  (+) Chipped, Poor Dentition, Upper Dentures, Dental Advidsory Given   Pulmonary neg shortness of breath, COPD, Current Smoker          Cardiovascular Exercise Tolerance: Good hypertension, (-) angina (-) Past MI and (-) DOE      Neuro/Psych  PSYCHIATRIC DISORDERS Anxiety Depression Bipolar Disorder   negative neurological ROS     GI/Hepatic negative GI ROS, Neg liver ROS,neg GERD  ,,  Endo/Other  negative endocrine ROS    Renal/GU negative Renal ROS  negative genitourinary   Musculoskeletal  (+) Arthritis ,    Abdominal   Peds  Hematology negative hematology ROS (+)   Anesthesia Other Findings Past Medical History: No date: Anxiety No date: Arthritis No date: Bipolar 1 disorder (HCC) No date: Depression No date: Osteoarthritis  Past Surgical History: No date: ANTERIOR CRUCIATE LIGAMENT (ACL) REVISION; Right 2013: DILATION AND CURETTAGE OF UTERUS     Comment:  for heavy bleeding 2013: ENDOMETRIAL ABLATION No date: TUBAL LIGATION 1990: WRIST ARTHROSCOPY; Left     Comment:  for degenerative changes     Reproductive/Obstetrics negative OB ROS                             Anesthesia Physical Anesthesia Plan  ASA: 3  Anesthesia Plan: General   Post-op Pain Management:    Induction: Intravenous  PONV Risk Score and Plan: Propofol infusion, TIVA and Ondansetron  Airway Management Planned: Natural Airway and Nasal Cannula  Additional Equipment:   Intra-op Plan:   Post-operative Plan:   Informed Consent: I have reviewed the  patients History and Physical, chart, labs and discussed the procedure including the risks, benefits and alternatives for the proposed anesthesia with the patient or authorized representative who has indicated his/her understanding and acceptance.     Dental Advisory Given  Plan Discussed with: Anesthesiologist, CRNA and Surgeon  Anesthesia Plan Comments: (Patient consented for risks of anesthesia including but not limited to:  - adverse reactions to medications - risk of intubation if required - damage to teeth, lips or other oral mucosa - sore throat or hoarseness - Damage to heart, brain, lungs or loss of life  Patient voiced understanding.)        Anesthesia Quick Evaluation

## 2023-01-25 NOTE — Transfer of Care (Signed)
Immediate Anesthesia Transfer of Care Note  Patient: Alexa Newman  Procedure(s) Performed: COLONOSCOPY WITH PROPOFOL POLYPECTOMY  Patient Location: PACU  Anesthesia Type:General  Level of Consciousness: drowsy  Airway & Oxygen Therapy: Patient Spontanous Breathing  Post-op Assessment: Report given to RN and Post -op Vital signs reviewed and stable  Post vital signs: Reviewed and stable  Last Vitals:  Vitals Value Taken Time  BP 118/76 01/25/23 1123  Temp 36.9 C 01/25/23 1123  Pulse 83 01/25/23 1123  Resp 18 01/25/23 1123  SpO2 98 % 01/25/23 1123    Last Pain:  Vitals:   01/25/23 1123  TempSrc: Temporal  PainSc:          Complications: No notable events documented.

## 2023-01-25 NOTE — Op Note (Signed)
[facility] Gastroenterology Patient Name: Alexa Newman Procedure Date: 01/25/2023 10:56 AM MRN: [id number] Account #: [id number] Date of Birth: [birth year] Admit Type: Outpatient Age: 61 Room: [facility] ENDO ROOM 1 Gender: Female Note Status: Finalized Instrument Name: Prentice Docker [id number] Procedure:             Colonoscopy Indications:           Surveillance: Personal history of adenomatous polyps                         on last colonoscopy > 5 years ago Providers:             Wyline Mood MD, MD Referring MD:          Marzella Schlein. Bacigalupo (Referring MD) Medicines:             Monitored Anesthesia Care Complications:         No immediate complications. Procedure:             Pre-Anesthesia Assessment:                        - Prior to the procedure, a History and Physical was                         performed, and patient medications, allergies and                         sensitivities were reviewed. The patient's tolerance                         of previous anesthesia was reviewed.                        - The risks and benefits of the procedure and the                         sedation options and risks were discussed with the                         patient. All questions were answered and informed                         consent was obtained.                        - ASA Grade Assessment: II - A patient with mild                         systemic disease.                        After obtaining informed consent, the colonoscope was                         passed under direct vision. Throughout the procedure,                         the patient's blood pressure, pulse, and oxygen  saturations were monitored continuously. The                         Colonoscope was introduced through the anus and                         advanced to the the cecum, identified by the                         appendiceal orifice. The colonoscopy was performed                          with ease. The patient tolerated the procedure well.                         The quality of the bowel preparation was good. The                         ileocecal valve, appendiceal orifice, and rectum were                         photographed. Findings:      The perianal and digital rectal examinations were normal.      A 3 mm polyp was found in the descending colon. The polyp was sessile.       The polyp was removed with a jumbo cold forceps. Resection and retrieval       were complete.      The exam was otherwise without abnormality on direct and retroflexion       views.      There was a medium-sized lipoma, 20 mm in diameter, in the ascending       colon. Impression:            - One 3 mm polyp in the descending colon, removed with                         a jumbo cold forceps. Resected and retrieved.                        - The examination was otherwise normal on direct and                         retroflexion views. Recommendation:        - Discharge patient to home (with escort).                        - Resume previous diet.                        - Continue present medications.                        - Await pathology results.                        - Repeat colonoscopy for surveillance based on                         pathology results. Procedure Code(s):     --- Professional ---  13244, Colonoscopy, flexible; with biopsy, single or                         multiple Diagnosis Code(s):     --- Professional ---                        Z86.010, Personal history of colonic polyps                        D12.4, Benign neoplasm of descending colon CPT copyright 2022 American Medical Association. All rights reserved. The codes documented in this report are preliminary and upon coder review may  be revised to meet current compliance requirements. Wyline Mood, MD Wyline Mood MD, MD 01/25/2023 11:21:12 AM This report has been signed  electronically. Number of Addenda: 0 Note Initiated On: 01/25/2023 10:56 AM Scope Withdrawal Time: 0 hours 7 minutes 45 seconds  Total Procedure Duration: 0 hours 14 minutes 33 seconds  Estimated Blood Loss:  Estimated blood loss: none.      [facility]

## 2023-01-25 NOTE — H&P (Signed)
Wyline Mood, MD [address], [place], [place], [address] [address], [address], [place], [place], [address] Phone: [phone]  Fax: [phone]  Primary Care Physician:  Erasmo Downer, MD   Pre-Procedure History & Physical: HPI:  Alexa Newman is a 62 y.o. female is here for an colonoscopy.   Past Medical History:  Diagnosis Date   Anxiety    Arthritis    Bipolar 1 disorder (HCC)    Depression    Family history of adverse reaction to anesthesia    half sister claims that she would stop breathing during surgery x 2   Hypertension    Osteoarthritis    PONV (postoperative nausea and vomiting)    Pre-diabetes     Past Surgical History:  Procedure Laterality Date   ANTERIOR CRUCIATE LIGAMENT (ACL) REVISION Right    CARPAL TUNNEL RELEASE     COLONOSCOPY     COLONOSCOPY WITH PROPOFOL N/A 11/17/2017   Procedure: COLONOSCOPY WITH PROPOFOL;  Surgeon: Pasty Spillers, MD;  Location: [facility] ENDOSCOPY;  Service: Endoscopy;  Laterality: N/A;   COLONOSCOPY WITH PROPOFOL N/A 11/24/2022   Procedure: COLONOSCOPY WITH PROPOFOL;  Surgeon: Wyline Mood, MD;  Location: [facility] ENDOSCOPY;  Service: Gastroenterology;  Laterality: N/A;   DILATION AND CURETTAGE OF UTERUS  2013   for heavy bleeding   ENDOMETRIAL ABLATION  2013   KNEE ARTHROPLASTY Right 10/02/2020   Procedure: COMPUTER ASSISTED TOTAL KNEE ARTHROPLASTY;  Surgeon: Donato Heinz, MD;  Location: [facility] ORS;  Service: Orthopedics;  Laterality: Right;   right wrist surgery     TUBAL LIGATION     WRIST ARTHROSCOPY Left 1990   for degenerative changes    Prior to Admission medications   Medication Sig Start Date End Date Taking? Authorizing Provider  amLODipine (NORVASC) 10 MG tablet TAKE 1 TABLET BY MOUTH EVERY DAY 12/18/22   Bacigalupo, Marzella Schlein, MD  calcium-vitamin D (OSCAL WITH D) 500-5 MG-MCG tablet Take 1 tablet by mouth.    [provider]  doxepin (SINEQUAN) 10 MG capsule TAKE 1 CAPSULE (10 MG  TOTAL) BY MOUTH AT BEDTIME AS NEEDED. FOR SLEEP 02/09/22   Jomarie Longs, MD  HYDROcodone-acetaminophen (NORCO/VICODIN) 5-325 MG tablet Take 1 tablet by mouth every 6 (six) hours as needed. 11/03/22   [provider]  hydrOXYzine (ATARAX) 10 MG tablet TAKE 1 TABLET BY MOUTH 3 TIMES DAILY AS NEEDED FOR ANXIETY. 11/10/21   Jomarie Longs, MD  lamoTRIgine (LAMICTAL) 100 MG tablet TAKE 1 TABLET BY MOUTH TWICE A DAY 01/19/23   Jomarie Longs, MD  methocarbamol (ROBAXIN) 500 MG tablet Take 500 mg by mouth 2 (two) times daily.    [provider]  nicotine (NICODERM CQ - DOSED IN MG/24 HOURS) 21 mg/24hr patch PLACE 1 PATCH ONTO THE SKIN DAILY. 07/06/22   Erasmo Downer, MD  rosuvastatin (CRESTOR) 5 MG tablet Take 1 tablet (5 mg total) by mouth daily. TAKE 1 TABLET BY MOUTH EVERY OTHER DAY 01/25/23   Erasmo Downer, MD  Semaglutide-Weight Management 0.5 MG/0.5ML SOAJ Inject 0.5 mg into the skin once a week for 28 days. 01/19/23 02/16/23  Erasmo Downer, MD  Semaglutide-Weight Management 1 MG/0.5ML SOAJ Inject 1 mg into the skin once a week. 02/17/23   Erasmo Downer, MD  triamterene-hydrochlorothiazide (MAXZIDE-25) 37.5-25 MG tablet TAKE 1 TABLET BY MOUTH EVERY DAY 09/15/22   Bacigalupo, Marzella Schlein, MD  UNABLE TO FIND Take 1 tablet by mouth in the morning and at bedtime. Med  Name: calcium, magnesium and zinc    [provider]    Allergies as of 12/17/2022 - Review Complete 11/24/2022  Allergen Reaction Noted   Pregabalin Other (See Comments) 12/06/2017    Family History  Problem Relation Age of Onset   Diabetes Mother    Depression Mother    Hypertension Mother    Crohn's disease Mother    Lung cancer Mother 41       former smoker   Congestive Heart Failure Father    Breast cancer Sister 34   Alcohol abuse Sister    Drug abuse Sister    Anxiety disorder Sister    Depression Sister    Bipolar disorder Sister    Alcohol abuse Brother    Drug abuse  Brother    Anxiety disorder Brother    Depression Brother    Colon polyps Brother    HIV Brother    Alcohol abuse Brother    Drug abuse Brother    Anxiety disorder Brother    Depression Brother    Colon polyps Brother    HIV Brother    Skin cancer Maternal Grandfather    Colon cancer Neg Hx    Ovarian cancer Neg Hx    Cervical cancer Neg Hx     Social History   Socioeconomic History   Marital status: Divorced    Spouse name: Not on file   Number of children: 1   Years of education: Not on file   Highest education level: Some college, no degree  Occupational History   Occupation: disabled    Comment: on SSI awaiting decision about disability  Tobacco Use   Smoking status: Some Days    Types: Cigarettes   Smokeless tobacco: Never   Tobacco comments:    Currently smokes 1 cig every few days  Vaping Use   Vaping status: Former  Substance and Sexual Activity   Alcohol use: Yes    Comment: 1 times per month   Drug use: Not Currently    Comment: former crack cocaine user   Sexual activity: Yes    Partners: Male    Birth control/protection: Post-menopausal, Surgical  Other Topics Concern   Not on file  Social History Narrative   Live with brother and his husband   Social Determinants of Health   Financial Resource Strain: Medium Risk (12/20/2022)   Overall Financial Resource Strain (CARDIA)    Difficulty of Paying Living Expenses: Somewhat hard  Food Insecurity: No Food Insecurity (12/20/2022)   Hunger Vital Sign    Worried About Running Out of Food in the Last Year: Never true    Ran Out of Food in the Last Year: Never true  Transportation Needs: No Transportation Needs (12/20/2022)   PRAPARE - Administrator, Civil Service (Medical): No    Lack of Transportation (Non-Medical): No  Physical Activity: Sufficiently Active (12/20/2022)   Exercise Vital Sign    Days of Exercise per Week: 5 days    Minutes of Exercise per Session: 60 min  Stress: No  Stress Concern Present (12/20/2022)   [employer] of Occupational Health - Occupational Stress Questionnaire    Feeling of Stress : Only a little  Social Connections: Socially Isolated (12/20/2022)   Social Connection and Isolation Panel [NHANES]    Frequency of Communication with Friends and Family: More than three times a week    Frequency of Social Gatherings with Friends and Family: More than three times a week  Attends Religious Services: Never    Active Member of Clubs or Organizations: No    Attends Banker Meetings: Not on file    Marital Status: Divorced  Intimate Partner Violence: Not At Risk (07/27/2017)   Humiliation, Afraid, Rape, and Kick questionnaire    Fear of Current or Ex-Partner: No    Emotionally Abused: No    Physically Abused: No    Sexually Abused: No    Review of Systems: See HPI, otherwise negative ROS  Physical Exam: LMP 12/07/2011  General:   Alert,  pleasant and cooperative in NAD Head:  Normocephalic and atraumatic. Neck:  Supple; no masses or thyromegaly. Lungs:  Clear throughout to auscultation, normal respiratory effort.    Heart:  +S1, +S2, Regular rate and rhythm, No edema. Abdomen:  Soft, nontender and nondistended. Normal bowel sounds, without guarding, and without rebound.   Neurologic:  Alert and  oriented x4;  grossly normal neurologically.  Impression/Plan: Alexa Newman is here for an colonoscopy to be performed for surveillance due to prior history of colon polyps   Risks, benefits, limitations, and alternatives regarding  colonoscopy have been reviewed with the patient.  Questions have been answered.  All parties agreeable.   Wyline Mood, MD  01/25/2023, 10:10 AM

## 2023-01-25 NOTE — Anesthesia Postprocedure Evaluation (Signed)
Anesthesia Post Note  Patient: Alexa Newman  Procedure(s) Performed: COLONOSCOPY WITH PROPOFOL POLYPECTOMY  Patient location during evaluation: Endoscopy Anesthesia Type: General Level of consciousness: awake and alert Pain management: pain level controlled Vital Signs Assessment: post-procedure vital signs reviewed and stable Respiratory status: spontaneous breathing, nonlabored ventilation, respiratory function stable and patient connected to nasal cannula oxygen Cardiovascular status: blood pressure returned to baseline and stable Postop Assessment: no apparent nausea or vomiting Anesthetic complications: no   No notable events documented.   Last Vitals:  Vitals:   01/25/23 1133 01/25/23 1141  BP: 121/71 (!) 134/94  Pulse: 79 78  Resp: 20 18  Temp:    SpO2: 99% 98%    Last Pain:  Vitals:   01/25/23 1141  TempSrc:   PainSc: 0-No pain                 Louie Boston

## 2023-01-25 NOTE — Telephone Encounter (Signed)
Rx sent 

## 2023-01-26 ENCOUNTER — Encounter: Payer: Self-pay | Admitting: Gastroenterology

## 2023-01-26 NOTE — Telephone Encounter (Signed)
Requested Prescriptions  Pending Prescriptions Disp Refills   Semaglutide-Weight Management (WEGOVY) 0.25 MG/0.5ML SOAJ [Pharmacy Med Name: WEGOVY 0.25 MG/0.5 ML PEN] 2 mL 2    Sig: INJECT 0.25 MG INTO THE SKIN ONCE A WEEK FOR 28 DAYS     Endocrinology:  Diabetes - GLP-1 Receptor Agonists - semaglutide Failed - 01/25/2023  2:37 PM      Failed - HBA1C in normal range and within 180 days    Hgb A1c MFr Bld  Date Value Ref Range Status  10/05/2022 6.2 (H) 4.8 - 5.6 % Final    Comment:             Prediabetes: 5.7 - 6.4          Diabetes: >6.4          Glycemic control for adults with diabetes: <7.0          Passed - Cr in normal range and within 360 days    Creatinine, Ser  Date Value Ref Range Status  10/10/2022 0.72 0.44 - 1.00 mg/dL Final         Passed - Valid encounter within last 6 months    Recent Outpatient Visits           1 month ago Class 1 obesity with serious comorbidity and body mass index (BMI) of 32.0 to 32.9 in adult, unspecified obesity type   [facility] [place], Marzella Schlein, MD   3 months ago Essential hypertension   [facility] [place], Marzella Schlein, MD   10 months ago Encounter for annual health examination   [facility] [place], Marzella Schlein, MD   1 year ago Right hip pain   [facility] [place], Marzella Schlein, MD   1 year ago Essential hypertension    [facility] [place], Marzella Schlein, MD       Future Appointments             In 2 months Bacigalupo, Marzella Schlein, MD [facility]

## 2023-01-27 LAB — SURGICAL PATHOLOGY

## 2023-01-28 ENCOUNTER — Encounter: Payer: Self-pay | Admitting: Gastroenterology

## 2023-01-29 ENCOUNTER — Ambulatory Visit: Payer: Self-pay

## 2023-01-29 NOTE — Telephone Encounter (Signed)
  Chief Complaint: medication reaction Symptoms: nausea Frequency: 1-2 days Pertinent Negatives: NA Disposition: [] ED /[] Urgent Care (no appt availability in office) / [] Appointment(In office/virtual)/ []  [facility]/ [x] Home Care/ [] Refused Recommended Disposition /[]  Mobile Bus/ []  Follow-up with PCP Additional Notes: pt wanting to try something OTC for nausea r/t Wegovy first. Recommended pt try Pepto or Emetrol to see if will help. Pt done Wegovy injection yesterday and nausea occurred after. Pt states will try OTC and if no improvement will call back.   Summary: Nausea from [facility]   The patient called in stating she is having symptoms-nausea from the Semaglutide-Weight Management (WEGOVY) 0.25 MG/0.5ML SOAJ. She is requesting maybe something for nausea to be called into her pharmacy  [id number] - [place], [place] - [address] Phone: [phone] Fax: [phone]   Please assist patient further         Reason for Disposition  Caller has medicine question only, adult not sick, AND triager answers question  Answer Assessment - Initial Assessment Questions 1. NAME of MEDICINE: "What medicine(s) are you calling about?"     Wegovy 2. QUESTION: "What is your question?" (e.g., double dose of medicine, side effect)     Wanting to have something for nausea 3. PRESCRIBER: "Who prescribed the medicine?" Reason: if prescribed by specialist, call should be referred to that group.     Dr. Leonard Schwartz 4. SYMPTOMS: "Do you have any symptoms?" If Yes, ask: "What symptoms are you having?"  "How bad are the symptoms (e.g., mild, moderate, severe)     nausea  Protocols used: Medication Question Call-A-AH

## 2023-02-07 ENCOUNTER — Other Ambulatory Visit: Payer: Self-pay | Admitting: Family Medicine

## 2023-02-16 ENCOUNTER — Ambulatory Visit (INDEPENDENT_AMBULATORY_CARE_PROVIDER_SITE_OTHER): Payer: Medicaid Other | Admitting: Psychiatry

## 2023-02-16 ENCOUNTER — Encounter: Payer: Self-pay | Admitting: Psychiatry

## 2023-02-16 VITALS — BP 150/88 | HR 94 | Temp 97.6°F | Ht 63.0 in | Wt 190.8 lb

## 2023-02-16 DIAGNOSIS — Z8659 Personal history of other mental and behavioral disorders: Secondary | ICD-10-CM

## 2023-02-16 DIAGNOSIS — F411 Generalized anxiety disorder: Secondary | ICD-10-CM | POA: Diagnosis not present

## 2023-02-16 DIAGNOSIS — F1421 Cocaine dependence, in remission: Secondary | ICD-10-CM

## 2023-02-16 DIAGNOSIS — F3176 Bipolar disorder, in full remission, most recent episode depressed: Secondary | ICD-10-CM | POA: Diagnosis not present

## 2023-02-16 DIAGNOSIS — F5105 Insomnia due to other mental disorder: Secondary | ICD-10-CM

## 2023-02-16 DIAGNOSIS — F17200 Nicotine dependence, unspecified, uncomplicated: Secondary | ICD-10-CM | POA: Diagnosis not present

## 2023-02-16 NOTE — Patient Instructions (Signed)

## 2023-02-16 NOTE — Progress Notes (Signed)
BH MD OP Progress Note  02/16/2023 1:30 PM Alexa Newman  MRN:  [id number]  Chief Complaint:  Chief Complaint  Patient presents with   Follow-up   Manic Behavior   Depression   Anxiety   Medication Refill   HPI: Alexa Newman is a 62 year old Caucasian female, divorced, on SSI, lives in [place], has a history of bipolar disorder, borderline personality disorder, cocaine use disorder, GAD, insomnia, tobacco use disorder was evaluated in office today.   Patient today reports overall mood symptoms are stable.  She is currently compliant on the Lamictal denies side effects.  The patient, with a history of knee pain managed with physical therapy and over-the-counter analgesics, reports tolerable pain levels. She also has a history of insomnia, managed with doxepin as needed, and reports improved sleep patterns, going to bed earlier and waking up feeling rested. The patient has not needed doxepin for over a month.  The patient is on a weight management regimen with Wegovy, but reports no significant weight loss yet. She also reports a long-standing smoking habit, expressing frustration and disappointment at recent relapse after a period of successful reduction. She expresses a strong desire to quit, citing increasing costs and chest discomfort. She has previously tried nicotine patches with some success and is considering other cessation aids.  The patient reports a generally positive mood, enjoying time with family, particularly her grandchildren.  Patient denies any suicidality, homicidality or perceptual disturbances.  Patient denies any other concerns today.  Visit Diagnosis:    ICD-10-CM   1. Bipolar disorder, in full remission, most recent episode depressed (HCC)  F31.76     2. GAD (generalized anxiety disorder)  F41.1     3. Insomnia due to mental condition  F51.05    mood, pain    4. Tobacco use disorder  F17.200     5. Hx of borderline personality disorder  Z86.59      6. Cocaine use disorder, moderate, in sustained remission (HCC)  F14.21       Past Psychiatric History: I have reviewed past psychiatric history from progress note on 07/27/2017.  Past Medical History:  Past Medical History:  Diagnosis Date   Anxiety    Arthritis    Bipolar 1 disorder (HCC)    Depression    Family history of adverse reaction to anesthesia    half sister claims that she would stop breathing during surgery x 2   Hypertension    Osteoarthritis    PONV (postoperative nausea and vomiting)    Pre-diabetes     Past Surgical History:  Procedure Laterality Date   ANTERIOR CRUCIATE LIGAMENT (ACL) REVISION Right    CARPAL TUNNEL RELEASE     COLONOSCOPY     COLONOSCOPY WITH PROPOFOL N/A 11/17/2017   Procedure: COLONOSCOPY WITH PROPOFOL;  Surgeon: Pasty Spillers, MD;  Location: [facility] ENDOSCOPY;  Service: Endoscopy;  Laterality: N/A;   COLONOSCOPY WITH PROPOFOL N/A 11/24/2022   Procedure: COLONOSCOPY WITH PROPOFOL;  Surgeon: Wyline Mood, MD;  Location: [facility] ENDOSCOPY;  Service: Gastroenterology;  Laterality: N/A;   COLONOSCOPY WITH PROPOFOL N/A 01/25/2023   Procedure: COLONOSCOPY WITH PROPOFOL;  Surgeon: Wyline Mood, MD;  Location: [facility] ENDOSCOPY;  Service: Gastroenterology;  Laterality: N/A;   DILATION AND CURETTAGE OF UTERUS  2013   for heavy bleeding   ENDOMETRIAL ABLATION  2013   KNEE ARTHROPLASTY Right 10/02/2020   Procedure: COMPUTER ASSISTED TOTAL KNEE ARTHROPLASTY;  Surgeon: Donato Heinz, MD;  Location: [facility] ORS;  Service: Orthopedics;  Laterality: Right;   POLYPECTOMY  01/25/2023   Procedure: POLYPECTOMY;  Surgeon: Wyline Mood, MD;  Location: [facility] ENDOSCOPY;  Service: Gastroenterology;;   right wrist surgery     TUBAL LIGATION     WRIST ARTHROSCOPY Left 1990   for degenerative changes    Family Psychiatric History: I have reviewed family psychiatric history from progress note on 07/27/2017.  Family History:  Family History  Problem Relation Age of  Onset   Diabetes Mother    Depression Mother    Hypertension Mother    Crohn's disease Mother    Lung cancer Mother 54       former smoker   Congestive Heart Failure Father    Breast cancer Sister 45   Alcohol abuse Sister    Drug abuse Sister    Anxiety disorder Sister    Depression Sister    Bipolar disorder Sister    Alcohol abuse Brother    Drug abuse Brother    Anxiety disorder Brother    Depression Brother    Colon polyps Brother    HIV Brother    Alcohol abuse Brother    Drug abuse Brother    Anxiety disorder Brother    Depression Brother    Colon polyps Brother    HIV Brother    Skin cancer Maternal Grandfather    Colon cancer Neg Hx    Ovarian cancer Neg Hx    Cervical cancer Neg Hx     Social History: I have reviewed social history from progress note on 07/27/2017. Social History   Socioeconomic History   Marital status: Divorced    Spouse name: Not on file   Number of children: 1   Years of education: Not on file   Highest education level: Some college, no degree  Occupational History   Occupation: disabled    Comment: on SSI awaiting decision about disability  Tobacco Use   Smoking status: Some Days    Types: Cigarettes   Smokeless tobacco: Never   Tobacco comments:    Currently smokes 1 cig every few days  Vaping Use   Vaping status: Former  Substance and Sexual Activity   Alcohol use: Yes    Comment: 1 times per month   Drug use: Not Currently    Comment: former crack cocaine user   Sexual activity: Yes    Partners: Male    Birth control/protection: Post-menopausal, Surgical  Other Topics Concern   Not on file  Social History Narrative   Live with brother and his husband   Social Determinants of Health   Financial Resource Strain: Medium Risk (12/20/2022)   Overall Financial Resource Strain (CARDIA)    Difficulty of Paying Living Expenses: Somewhat hard  Food Insecurity: No Food Insecurity (12/20/2022)   Hunger Vital Sign    Worried  About Running Out of Food in the Last Year: Never true    Ran Out of Food in the Last Year: Never true  Transportation Needs: No Transportation Needs (12/20/2022)   PRAPARE - Administrator, Civil Service (Medical): No    Lack of Transportation (Non-Medical): No  Physical Activity: Sufficiently Active (12/20/2022)   Exercise Vital Sign    Days of Exercise per Week: 5 days    Minutes of Exercise per Session: 60 min  Stress: No Stress Concern Present (12/20/2022)   [employer] of Occupational Health - Occupational Stress Questionnaire    Feeling of Stress : Only a little  Social Connections: Socially Isolated (12/20/2022)  Social Advertising account executive [NHANES]    Frequency of Communication with Friends and Family: More than three times a week    Frequency of Social Gatherings with Friends and Family: More than three times a week    Attends Religious Services: Never    Database administrator or Organizations: No    Attends Engineer, structural: Not on file    Marital Status: Divorced    Allergies:  Allergies  Allergen Reactions   Pregabalin Other (See Comments)    Other reaction(s): Other (See Comments) Suicidal ideation  Suicidal ideation    Metabolic Disorder Labs: Lab Results  Component Value Date   HGBA1C 6.2 (H) 10/05/2022   No results found for: "PROLACTIN" Lab Results  Component Value Date   CHOL 168 10/05/2022   TRIG 232 (H) 10/05/2022   HDL 59 10/05/2022   CHOLHDL 2.6 12/02/2020   LDLCALC 71 10/05/2022   LDLCALC 68 12/02/2020   Lab Results  Component Value Date   TSH 0.661 10/05/2022   TSH 0.801 12/02/2020    Therapeutic Level Labs: No results found for: "LITHIUM" No results found for: "VALPROATE" No results found for: "CBMZ"  Current Medications: Current Outpatient Medications  Medication Sig Dispense Refill   amLODipine (NORVASC) 10 MG tablet TAKE 1 TABLET BY MOUTH EVERY DAY 90 tablet 0   calcium-vitamin D  (OSCAL WITH D) 500-5 MG-MCG tablet Take 1 tablet by mouth.     celecoxib (CELEBREX) 100 MG capsule Take 100 mg by mouth 2 (two) times daily.     doxepin (SINEQUAN) 10 MG capsule TAKE 1 CAPSULE (10 MG TOTAL) BY MOUTH AT BEDTIME AS NEEDED. FOR SLEEP 90 capsule 0   HYDROcodone-acetaminophen (NORCO/VICODIN) 5-325 MG tablet Take 1 tablet by mouth every 6 (six) hours as needed.     hydrOXYzine (ATARAX) 10 MG tablet TAKE 1 TABLET BY MOUTH 3 TIMES DAILY AS NEEDED FOR ANXIETY. 270 tablet 0   lamoTRIgine (LAMICTAL) 100 MG tablet TAKE 1 TABLET BY MOUTH TWICE A DAY 180 tablet 0   methocarbamol (ROBAXIN) 500 MG tablet Take 500 mg by mouth 2 (two) times daily.     nicotine (NICODERM CQ - DOSED IN MG/24 HOURS) 21 mg/24hr patch PLACE 1 PATCH ONTO THE SKIN DAILY. 28 patch 3   rosuvastatin (CRESTOR) 5 MG tablet Take 1 tablet (5 mg total) by mouth daily. TAKE 1 TABLET BY MOUTH EVERY OTHER DAY 90 tablet 3   Semaglutide-Weight Management (WEGOVY) 0.25 MG/0.5ML SOAJ INJECT 0.25 MG INTO THE SKIN ONCE A WEEK FOR 28 DAYS 2 mL 2   Semaglutide-Weight Management 0.5 MG/0.5ML SOAJ Inject 0.5 mg into the skin once a week for 28 days. 2 mL 0   [START ON 02/17/2023] Semaglutide-Weight Management 1 MG/0.5ML SOAJ Inject 1 mg into the skin once a week. 2 mL 2   triamterene-hydrochlorothiazide (MAXZIDE-25) 37.5-25 MG tablet TAKE 1 TABLET BY MOUTH EVERY DAY 90 tablet 0   UNABLE TO FIND Take 1 tablet by mouth in the morning and at bedtime. Med Name: calcium, magnesium and zinc     No current facility-administered medications for this visit.     Musculoskeletal: Strength & Muscle Tone: within normal limits Gait & Station: normal Patient leans: N/A  Psychiatric Specialty Exam: Review of Systems  Psychiatric/Behavioral: Negative.      Blood pressure (!) 150/88, pulse 94, temperature 97.6 F (36.4 C), temperature source Skin, height 5\' 3"  (1.6 m), weight 190 lb 12.8 oz (86.5 kg), last menstrual period 12/07/2011.Body mass  index  is 33.8 kg/m.  General Appearance: Fairly Groomed  Eye Contact:  Fair  Speech:  Clear and Coherent  Volume:  Normal  Mood:  Euthymic  Affect:  Congruent  Thought Process:  Goal Directed and Descriptions of Associations: Intact  Orientation:  Full (Time, Place, and Person)  Thought Content: Logical   Suicidal Thoughts:  No  Homicidal Thoughts:  No  Memory:  Immediate;   Fair Recent;   Fair Remote;   Fair  Judgement:  Fair  Insight:  Fair  Psychomotor Activity:  Normal  Concentration:  Concentration: Fair and Attention Span: Fair  Recall:  [employer] of Knowledge: Fair  Language: Fair  Akathisia:  No  Handed:  Right  AIMS (if indicated): not done  Assets:  Communication Skills Desire for Improvement Housing Social Support Transportation  ADL's:  Intact  Cognition: WNL  Sleep:  Fair   Screenings: AIMS    Flowsheet Row Video Visit from 10/16/2021 in [facility] Video Visit from 07/17/2021 in [facility]  AIMS Total Score 0 0      GAD-7    Flowsheet Row Office Visit from 02/16/2023 in [facility] Office Visit from 10/01/2022 in [facility] Video Visit from 10/16/2021 in [facility] Video Visit from 07/17/2021 in [facility] Video Visit from 06/19/2021 in [facility]  Total GAD-7 Score 3 7 13 2 13       PHQ2-9    Flowsheet Row Office Visit from 02/16/2023 in [facility] Office Visit from 12/21/2022 in [facility] Office Visit from 10/05/2022 in [facility] Office Visit from 10/01/2022 in [facility] Video Visit from 05/28/2022 in [facility]  PHQ-2 Total Score 0 1 3 0 0  PHQ-9 Total Score -- -- 8 6 --      Flowsheet Row Office Visit from 02/16/2023 in [facility] Admission (Discharged) from 01/25/2023 in [facility] Admission (Discharged) from 11/24/2022 in [facility]  C-SSRS RISK CATEGORY Moderate Risk No Risk Error: Question 6 not populated        Assessment and Plan: BLESSIN GEERTS is a 62 year old Caucasian female, divorced, on SSI, lives in [place], has a history of bipolar disorder, borderline personality disorder, insomnia was evaluated in office today.  Patient is currently stable with regards to her mood although continues to struggle with smoking cessation, discussed assessment and plan as noted below.  Bipolar Disorder-in remission Well-managed on Lamictal 100 mg twice daily and hydroxyzine 10 mg as needed. No recent suicidal ideation or attempts. Discussed past suicidal attempt and current mental state. - Continue Lamictal 100 mg twice daily - Continue hydroxyzine 10 mg as needed   Generalized anxiety disorder (GAD)-stable Anticipates increased irritability and discomfort during the holiday season, consistent with previous years. No current depressive symptoms. Prefers limited interaction with certain family members. - Monitor mood and symptoms during the holiday season - Encourage engagement in enjoyable activities and social interactions - Continue hydroxyzine 10 mg 3 times a day as needed  Insomnia-improving Improved sleep patterns with doxepin as needed, not required for over a month. Sleeping from midnight to 7-8 AM, waking rested. - Continue doxepin 10 mg as needed for insomnia  Nicotine  Dependence-stable Resumed smoking after cessation. Motivated to quit, considering nicotine lozenges and patches. Reports chest pain associated with smoking. Discussed risks of vaping and  alternative nicotine replacement therapies. - Recommend nicotine lozenges (2 mg or 4 mg) with nicotine patches - Provide smoking cessation resources, including online coaching and quit lines, provided counseling for 5 minutes. - Text 'QUIT' to 404-615-2639 for support - Call 1-800-QUIT-NOW for additional resources   On Wegovy for weight management, no significant weight loss yet. Currently on 0.25 mg dose, with planned gradual increase. - Continue Wegovy with gradual dose escalation - Monitor weight and energy levels - Encourage physical activity and healthy eating   Follow-up - Schedule follow-up appointment in six months - Advise to call if any issues arise before the next scheduled visit.    Consent: Patient/Guardian gives verbal consent for treatment and assignment of benefits for services provided during this visit. Patient/Guardian expressed understanding and agreed to proceed.   This note was generated in part or whole with voice recognition software. Voice recognition is usually quite accurate but there are transcription errors that can and very often do occur. I apologize for any typographical errors that were not detected and corrected.    Jomarie Longs, MD 02/16/2023, 1:30 PM

## 2023-02-28 ENCOUNTER — Other Ambulatory Visit: Payer: Self-pay | Admitting: Family Medicine

## 2023-03-02 NOTE — Telephone Encounter (Signed)
Requested medication (s) are due for refill today: yes  Requested medication (s) are on the active medication list: hx provider  Last refill:  02/16/23   Future visit scheduled: yes  Notes to clinic:  hx provider   Requested Prescriptions  Pending Prescriptions Disp Refills   celecoxib (CELEBREX) 100 MG capsule [Pharmacy Med Name: CELECOXIB 100 MG CAPSULE] 60 capsule 2    Sig: TAKE 1 CAPSULE BY MOUTH TWICE A DAY     Analgesics:  COX2 Inhibitors Failed - 03/02/2023  8:47 AM      Failed - Manual Review: Labs are only required if the patient has taken medication for more than 8 weeks.      Passed - HGB in normal range and within 360 days    Hemoglobin  Date Value Ref Range Status  10/10/2022 13.8 12.0 - 15.0 g/dL Final  28/31/5176 16.0 11.1 - 15.9 g/dL Final         Passed - Cr in normal range and within 360 days    Creatinine, Ser  Date Value Ref Range Status  10/10/2022 0.72 0.44 - 1.00 mg/dL Final         Passed - HCT in normal range and within 360 days    HCT  Date Value Ref Range Status  10/10/2022 43.8 36.0 - 46.0 % Final   Hematocrit  Date Value Ref Range Status  12/02/2020 41.8 34.0 - 46.6 % Final         Passed - AST in normal range and within 360 days    AST  Date Value Ref Range Status  10/05/2022 19 0 - 40 IU/L Final         Passed - ALT in normal range and within 360 days    ALT  Date Value Ref Range Status  10/05/2022 17 0 - 32 IU/L Final         Passed - eGFR is 30 or above and within 360 days    GFR calc Af Amer  Date Value Ref Range Status  11/27/2019 103 >59 mL/min/1.73 Final    Comment:    **[facility] currently reports eGFR in compliance with the current**   recommendations of the [employer]. [facility] will   update reporting as new guidelines are published from the NKF-ASN   Task force.    GFR, Estimated  Date Value Ref Range Status  10/10/2022 >60 >60 mL/min Final    Comment:    (NOTE) Calculated using the CKD-EPI  Creatinine Equation (2021)    eGFR  Date Value Ref Range Status  10/05/2022 79 >59 mL/min/1.73 Final         Passed - Patient is not pregnant      Passed - Valid encounter within last 12 months    Recent Outpatient Visits           2 months ago Class 1 obesity with serious comorbidity and body mass index (BMI) of 32.0 to 32.9 in adult, unspecified obesity type   [facility] [place], Marzella Schlein, MD   4 months ago Essential hypertension   [place] [facility] [place], Marzella Schlein, MD   11 months ago Encounter for annual health examination   [place], Marzella Schlein, MD   1 year ago Right hip pain   [place] [facility] [place], Marzella Schlein, MD   1 year ago Essential hypertension   [place] [facility] [place], Marzella Schlein, MD  Future Appointments             In 1 month Bacigalupo, Marzella Schlein, MD [facility], [facility]

## 2023-03-09 ENCOUNTER — Other Ambulatory Visit: Payer: Self-pay | Admitting: Family Medicine

## 2023-03-13 ENCOUNTER — Other Ambulatory Visit: Payer: Self-pay | Admitting: Family Medicine

## 2023-03-16 NOTE — Telephone Encounter (Signed)
 Requested Prescriptions  Pending Prescriptions Disp Refills   amLODipine  (NORVASC ) 10 MG tablet [Pharmacy Med Name: AMLODIPINE  BESYLATE 10 MG TAB] 90 tablet 0    Sig: TAKE 1 TABLET BY MOUTH EVERY DAY     Cardiovascular: Calcium  Channel Blockers 2 Failed - 03/16/2023  2:08 PM      Failed - Last BP in normal range    BP Readings from Last 1 Encounters:  01/25/23 (!) 134/94         Passed - Last Heart Rate in normal range    Pulse Readings from Last 1 Encounters:  01/25/23 78         Passed - Valid encounter within last 6 months    Recent Outpatient Visits           2 months ago Class 1 obesity with serious comorbidity and body mass index (BMI) of 32.0 to 32.9 in adult, unspecified obesity type   [facility] [place], Jon HERO, MD   5 months ago Essential hypertension   [place] [facility] [place], Jon HERO, MD   1 year ago Encounter for annual health examination   [place] [facility] [place], Jon HERO, MD   1 year ago Right hip pain   [place] [facility] [place], Jon HERO, MD   1 year ago Essential hypertension   [place] [facility] [place], Jon HERO, MD       Future Appointments             In 3 weeks Bacigalupo, Jon HERO, MD [facility]

## 2023-03-22 DIAGNOSIS — M2392 Unspecified internal derangement of left knee: Secondary | ICD-10-CM | POA: Diagnosis not present

## 2023-03-22 DIAGNOSIS — M1712 Unilateral primary osteoarthritis, left knee: Secondary | ICD-10-CM | POA: Diagnosis not present

## 2023-03-22 DIAGNOSIS — S83012D Lateral subluxation of left patella, subsequent encounter: Secondary | ICD-10-CM | POA: Diagnosis not present

## 2023-03-22 DIAGNOSIS — Z6832 Body mass index (BMI) 32.0-32.9, adult: Secondary | ICD-10-CM | POA: Diagnosis not present

## 2023-03-24 ENCOUNTER — Other Ambulatory Visit: Payer: Self-pay | Admitting: Orthopedic Surgery

## 2023-03-24 DIAGNOSIS — M1712 Unilateral primary osteoarthritis, left knee: Secondary | ICD-10-CM

## 2023-03-24 DIAGNOSIS — M2392 Unspecified internal derangement of left knee: Secondary | ICD-10-CM

## 2023-03-24 DIAGNOSIS — S83012D Lateral subluxation of left patella, subsequent encounter: Secondary | ICD-10-CM

## 2023-03-26 ENCOUNTER — Encounter: Payer: Self-pay | Admitting: Orthopedic Surgery

## 2023-03-31 ENCOUNTER — Ambulatory Visit
Admission: RE | Admit: 2023-03-31 | Discharge: 2023-03-31 | Discharge status: Home / Self Care | Payer: Medicaid Other | Source: Ambulatory Visit | Attending: Orthopedic Surgery | Admitting: Orthopedic Surgery

## 2023-03-31 DIAGNOSIS — M23322 Other meniscus derangements, posterior horn of medial meniscus, left knee: Secondary | ICD-10-CM | POA: Diagnosis not present

## 2023-03-31 DIAGNOSIS — M2392 Unspecified internal derangement of left knee: Secondary | ICD-10-CM

## 2023-03-31 DIAGNOSIS — M1712 Unilateral primary osteoarthritis, left knee: Secondary | ICD-10-CM

## 2023-03-31 DIAGNOSIS — S83012D Lateral subluxation of left patella, subsequent encounter: Secondary | ICD-10-CM

## 2023-04-08 ENCOUNTER — Other Ambulatory Visit (HOSPITAL_COMMUNITY)
Admission: RE | Admit: 2023-04-08 | Discharge: 2023-04-08 | Disposition: A | Discharge status: Home / Self Care | Payer: Medicaid Other | Source: Ambulatory Visit | Attending: Family Medicine | Admitting: Family Medicine

## 2023-04-08 ENCOUNTER — Encounter: Payer: Self-pay | Admitting: Family Medicine

## 2023-04-08 ENCOUNTER — Ambulatory Visit: Payer: Medicaid Other | Admitting: Family Medicine

## 2023-04-08 VITALS — BP 139/94 | HR 97

## 2023-04-08 DIAGNOSIS — E782 Mixed hyperlipidemia: Secondary | ICD-10-CM | POA: Diagnosis not present

## 2023-04-08 DIAGNOSIS — E66811 Obesity, class 1: Secondary | ICD-10-CM | POA: Diagnosis not present

## 2023-04-08 DIAGNOSIS — Z0000 Encounter for general adult medical examination without abnormal findings: Secondary | ICD-10-CM

## 2023-04-08 DIAGNOSIS — M23232 Derangement of other medial meniscus due to old tear or injury, left knee: Secondary | ICD-10-CM | POA: Diagnosis not present

## 2023-04-08 DIAGNOSIS — I10 Essential (primary) hypertension: Secondary | ICD-10-CM | POA: Diagnosis not present

## 2023-04-08 DIAGNOSIS — Z23 Encounter for immunization: Secondary | ICD-10-CM

## 2023-04-08 DIAGNOSIS — Z124 Encounter for screening for malignant neoplasm of cervix: Secondary | ICD-10-CM | POA: Insufficient documentation

## 2023-04-08 DIAGNOSIS — Z6832 Body mass index (BMI) 32.0-32.9, adult: Secondary | ICD-10-CM

## 2023-04-08 DIAGNOSIS — R7303 Prediabetes: Secondary | ICD-10-CM

## 2023-04-08 DIAGNOSIS — S83012D Lateral subluxation of left patella, subsequent encounter: Secondary | ICD-10-CM | POA: Diagnosis not present

## 2023-04-08 DIAGNOSIS — Z96651 Presence of right artificial knee joint: Secondary | ICD-10-CM | POA: Diagnosis not present

## 2023-04-08 DIAGNOSIS — Z0001 Encounter for general adult medical examination with abnormal findings: Secondary | ICD-10-CM | POA: Diagnosis not present

## 2023-04-08 DIAGNOSIS — Z1231 Encounter for screening mammogram for malignant neoplasm of breast: Secondary | ICD-10-CM

## 2023-04-08 DIAGNOSIS — M1712 Unilateral primary osteoarthritis, left knee: Secondary | ICD-10-CM | POA: Diagnosis not present

## 2023-04-08 MED ORDER — NICOTINE 21 MG/24HR TD PT24: 21.0000 mg | MEDICATED_PATCH | Freq: Every day | TRANSDERMAL | 3 refills | Status: DC

## 2023-04-08 MED ORDER — CELECOXIB 200 MG PO CAPS: 200.0000 mg | ORAL_CAPSULE | Freq: Two times a day (BID) | ORAL | 2 refills | Status: DC

## 2023-04-08 MED ORDER — ROSUVASTATIN CALCIUM 5 MG PO TABS: 5.0000 mg | ORAL_TABLET | ORAL | 3 refills | Status: DC

## 2023-04-08 NOTE — Assessment & Plan Note (Signed)
Elevated today but some missed doses of medicaiton this week Discussed importance of taking regularly No med changes today

## 2023-04-08 NOTE — Assessment & Plan Note (Signed)
Improving Continue wegovy 1mg  weekly F/u in 3 m

## 2023-04-08 NOTE — Progress Notes (Signed)
Complete physical exam   Patient: Alexa Newman   DOB: Apr 03, 1960   63 y.o. Female  MRN: [id number] Visit Date: 04/08/2023  Today's healthcare provider: Shirlee Latch, MD   Chief Complaint  Patient presents with   Annual Exam    Last CPE 03/13/22 Reports general diet with no exercise. She is feeling well and sleeps fairly well with no concerns to report   Immunizations    Conesent to receive pneumonia and shingles vaccine Provided consent form and advised 2nd shingles due in 2-6 months   Subjective    Alexa Newman is a 63 y.o. female who presents today for a complete physical exam.    No complaints. May be getting 2nd knee replacement soon. Needs refill of celebrex 200mg  BID   Wants to quit smoking and wants to try nicotine patches again - will Rx   Last depression screening scores    02/16/2023    1:19 PM 12/21/2022    2:52 PM 10/05/2022    3:23 PM  PHQ 2/9 Scores  PHQ - 2 Score  1 3  PHQ- 9 Score   8     Information is confidential and restricted. Go to Review Flowsheets to unlock data.   Last fall risk screening    12/21/2022    2:51 PM  Fall Risk   Falls in the past year? 0  Number falls in past yr: 0  Injury with Fall? 0  Risk for fall due to : No Fall Risks  Follow up Falls evaluation completed        Medications: Outpatient Medications Prior to Visit  Medication Sig   amLODipine (NORVASC) 10 MG tablet TAKE 1 TABLET BY MOUTH EVERY DAY   calcium-vitamin D (OSCAL WITH D) 500-5 MG-MCG tablet Take 1 tablet by mouth.   doxepin (SINEQUAN) 10 MG capsule TAKE 1 CAPSULE (10 MG TOTAL) BY MOUTH AT BEDTIME AS NEEDED. FOR SLEEP   hydrOXYzine (ATARAX) 10 MG tablet TAKE 1 TABLET BY MOUTH 3 TIMES DAILY AS NEEDED FOR ANXIETY.   lamoTRIgine (LAMICTAL) 100 MG tablet TAKE 1 TABLET BY MOUTH TWICE A DAY   methocarbamol (ROBAXIN) 500 MG tablet Take 500 mg by mouth 2 (two) times daily.   Semaglutide-Weight Management 1 MG/0.5ML SOAJ Inject 1 mg into the  skin once a week.   triamterene-hydrochlorothiazide (MAXZIDE-25) 37.5-25 MG tablet TAKE 1 TABLET BY MOUTH EVERY DAY   UNABLE TO FIND Take 1 tablet by mouth in the morning and at bedtime. Med Name: calcium, magnesium and zinc   [DISCONTINUED] celecoxib (CELEBREX) 100 MG capsule TAKE 1 CAPSULE BY MOUTH TWICE A DAY   [DISCONTINUED] nicotine (NICODERM CQ - DOSED IN MG/24 HOURS) 21 mg/24hr patch PLACE 1 PATCH ONTO THE SKIN DAILY.   [DISCONTINUED] rosuvastatin (CRESTOR) 5 MG tablet Take 1 tablet (5 mg total) by mouth daily. TAKE 1 TABLET BY MOUTH EVERY OTHER DAY   [DISCONTINUED] HYDROcodone-acetaminophen (NORCO/VICODIN) 5-325 MG tablet Take 1 tablet by mouth every 6 (six) hours as needed.   [DISCONTINUED] Semaglutide-Weight Management (WEGOVY) 0.25 MG/0.5ML SOAJ INJECT 0.25 MG INTO THE SKIN ONCE A WEEK FOR 28 DAYS   [DISCONTINUED] Semaglutide-Weight Management (WEGOVY) 0.5 MG/0.5ML SOAJ INJECT 0.5 MG INTO THE SKIN ONCE A WEEK FOR 28 DAYS.   No facility-administered medications prior to visit.    Review of Systems    Objective    BP (!) 139/94 (BP Location: Right Arm, Patient Position: Sitting, Cuff Size: Normal)   Pulse 97   LMP  12/07/2011   SpO2 99%    Physical Exam Vitals reviewed.  Constitutional:      General: She is not in acute distress.    Appearance: Normal appearance. She is well-developed. She is not diaphoretic.  HENT:     Head: Normocephalic and atraumatic.     Right Ear: Tympanic membrane, ear canal and external ear normal.     Left Ear: Tympanic membrane, ear canal and external ear normal.     Nose: Nose normal.     Mouth/Throat:     Mouth: Mucous membranes are moist.     Pharynx: Oropharynx is clear. No oropharyngeal exudate.  Eyes:     General: No scleral icterus.    Conjunctiva/sclera: Conjunctivae normal.     Pupils: Pupils are equal, round, and reactive to light.  Neck:     Thyroid: No thyromegaly.  Cardiovascular:     Rate and Rhythm: Normal rate and regular  rhythm.     Heart sounds: Normal heart sounds. No murmur heard. Pulmonary:     Effort: Pulmonary effort is normal. No respiratory distress.     Breath sounds: Normal breath sounds. No wheezing or rales.  Abdominal:     General: There is no distension.     Palpations: Abdomen is soft.     Tenderness: There is no abdominal tenderness.  Genitourinary:    Comments: GYN:  External genitalia within normal limits.  Vaginal mucosa pink, moist, normal rugae.  Nonfriable cervix without lesions, no discharge or bleeding noted on speculum exam.  Musculoskeletal:        General: No deformity.     Cervical back: Neck supple.     Right lower leg: No edema.     Left lower leg: No edema.  Lymphadenopathy:     Cervical: No cervical adenopathy.  Skin:    General: Skin is warm and dry.     Findings: No rash.  Neurological:     Mental Status: She is alert and oriented to person, place, and time. Mental status is at baseline.     Gait: Gait normal.  Psychiatric:        Mood and Affect: Mood normal.        Behavior: Behavior normal.        Thought Content: Thought content normal.      No results found for any visits on 04/08/23.  Assessment & Plan    Routine Health Maintenance and Physical Exam  Exercise Activities and Dietary recommendations  Goals   None     Immunization History  Administered Date(s) Administered   Hep A / Hep B 11/26/2006, 01/21/2007, 12/20/2008   Influenza, Seasonal, Injecte, Preservative Fre 12/21/2022   Influenza,inj,Quad PF,6+ Mos 01/13/2018, 11/17/2018, 11/27/2019, 12/02/2020   Moderna Sars-Covid-2 Vaccination 05/31/2019, 06/28/2019, 07/12/2019, 01/26/2020   Tdap 01/21/2007, 09/20/2009, 11/27/2019    Health Maintenance  Topic Date Due   Pneumococcal Vaccine 43-54 Years old (1 of 2 - PCV) Never done   Zoster Vaccines- Shingrix (1 of 2) Never done   Cervical Cancer Screening (HPV/Pap Cotest)  10/14/2022   COVID-19 Vaccine (5 - 2024-25 season) 11/08/2022   Lung  Cancer Screening  07/20/2023   MAMMOGRAM  08/10/2024   DTaP/Tdap/Td (4 - Td or Tdap) 11/26/2029   Colonoscopy  01/24/2030   INFLUENZA VACCINE  Completed   Hepatitis C Screening  Completed   HIV Screening  Completed   HPV VACCINES  Aged Out    Discussed health benefits of physical activity, and encouraged her to  engage in regular exercise appropriate for her age and condition.  Problem List Items Addressed This Visit       Cardiovascular and Mediastinum   Essential hypertension   Elevated today but some missed doses of medicaiton this week Discussed importance of taking regularly No med changes today      Relevant Medications   rosuvastatin (CRESTOR) 5 MG tablet   Other Relevant Orders   Comprehensive metabolic panel     Other   Obesity   Improving Continue wegovy 1mg  weekly F/u in 3 m      Relevant Orders   Comprehensive metabolic panel   Hemoglobin A1c   Prediabetes   Encourage low carb diet - recheck A1c. -Continue current management.      Relevant Orders   Comprehensive metabolic panel   Hemoglobin A1c   Hyperlipidemia   Managed on Crestor 5mg  every other day. -Continue current medication. - recheck cmp and lipids      Relevant Medications   rosuvastatin (CRESTOR) 5 MG tablet   Other Relevant Orders   Lipid Panel With LDL/HDL Ratio   Other Visit Diagnoses       Encounter for annual health examination    -  Primary     Immunization due       Relevant Orders   Varicella-zoster vaccine IM   Pneumococcal conjugate vaccine 20-valent     Breast cancer screening by mammogram       Relevant Orders   MM 3D SCREENING MAMMOGRAM BILATERAL BREAST     Cervical cancer screening       Relevant Orders   Cytology - PAP       Return in about 3 months (around 07/07/2023) for chronic disease f/u.     Shirlee Latch, MD  [facility] [phone] (phone) [phone] (fax)  [facility]

## 2023-04-08 NOTE — Assessment & Plan Note (Signed)
Managed on Crestor 5mg  every other day. -Continue current medication. - recheck cmp and lipids

## 2023-04-08 NOTE — Assessment & Plan Note (Signed)
Encourage low carb diet - recheck A1c. -Continue current management.

## 2023-04-09 ENCOUNTER — Encounter: Payer: Self-pay | Admitting: Family Medicine

## 2023-04-09 LAB — COMPREHENSIVE METABOLIC PANEL
ALT: 13 [IU]/L (ref 0–32)
AST: 15 [IU]/L (ref 0–40)
Albumin: 4.8 g/dL (ref 3.9–4.9)
Alkaline Phosphatase: 105 [IU]/L (ref 44–121)
BUN/Creatinine Ratio: 12 (ref 12–28)
BUN: 12 mg/dL (ref 8–27)
Bilirubin Total: 0.4 mg/dL (ref 0.0–1.2)
CO2: 22 mmol/L (ref 20–29)
Calcium: 10.6 mg/dL — ABNORMAL HIGH (ref 8.7–10.3)
Chloride: 99 mmol/L (ref 96–106)
Creatinine, Ser: 0.98 mg/dL (ref 0.57–1.00)
Globulin, Total: 2.6 g/dL (ref 1.5–4.5)
Glucose: 107 mg/dL — ABNORMAL HIGH (ref 70–99)
Potassium: 3.5 mmol/L (ref 3.5–5.2)
Sodium: 140 mmol/L (ref 134–144)
Total Protein: 7.4 g/dL (ref 6.0–8.5)
eGFR: 65 mL/min/{1.73_m2} (ref >59)

## 2023-04-09 LAB — LIPID PANEL WITH LDL/HDL RATIO
Cholesterol, Total: 157 mg/dL (ref 100–199)
HDL: 62 mg/dL (ref >39)
LDL Chol Calc (NIH): 73 mg/dL (ref 0–99)
LDL/HDL Ratio: 1.2 {ratio} (ref 0.0–3.2)
Triglycerides: 125 mg/dL (ref 0–149)
VLDL Cholesterol Cal: 22 mg/dL (ref 5–40)

## 2023-04-09 LAB — HEMOGLOBIN A1C
Est. average glucose Bld gHb Est-mCnc: 120 mg/dL
Hgb A1c MFr Bld: 5.8 % — ABNORMAL HIGH (ref 4.8–5.6)

## 2023-04-13 LAB — CYTOLOGY - PAP
Comment: NEGATIVE
Diagnosis: NEGATIVE
High risk HPV: NEGATIVE

## 2023-04-29 ENCOUNTER — Other Ambulatory Visit: Payer: Self-pay | Admitting: Psychiatry

## 2023-04-29 DIAGNOSIS — F411 Generalized anxiety disorder: Secondary | ICD-10-CM

## 2023-05-13 ENCOUNTER — Other Ambulatory Visit: Payer: Self-pay | Admitting: Family Medicine

## 2023-06-07 ENCOUNTER — Other Ambulatory Visit

## 2023-06-08 ENCOUNTER — Other Ambulatory Visit
Admission: RE | Admit: 2023-06-08 | Discharge: 2023-06-08 | Disposition: A | Discharge status: Home / Self Care | Payer: Self-pay | Source: Ambulatory Visit | Attending: Medical Genetics | Admitting: Medical Genetics

## 2023-06-08 DIAGNOSIS — Z006 Encounter for examination for normal comparison and control in clinical research program: Secondary | ICD-10-CM

## 2023-06-10 ENCOUNTER — Encounter: Payer: Self-pay | Admitting: Family Medicine

## 2023-06-10 ENCOUNTER — Ambulatory Visit: Payer: Medicaid Other | Admitting: Family Medicine

## 2023-06-10 VITALS — BP 117/81 | HR 92 | Ht 63.0 in | Wt 168.0 lb

## 2023-06-10 DIAGNOSIS — Z6830 Body mass index (BMI) 30.0-30.9, adult: Secondary | ICD-10-CM

## 2023-06-10 DIAGNOSIS — I10 Essential (primary) hypertension: Secondary | ICD-10-CM | POA: Diagnosis not present

## 2023-06-10 DIAGNOSIS — Z23 Encounter for immunization: Secondary | ICD-10-CM

## 2023-06-10 DIAGNOSIS — E782 Mixed hyperlipidemia: Secondary | ICD-10-CM

## 2023-06-10 DIAGNOSIS — E66811 Obesity, class 1: Secondary | ICD-10-CM | POA: Diagnosis not present

## 2023-06-10 DIAGNOSIS — F17200 Nicotine dependence, unspecified, uncomplicated: Secondary | ICD-10-CM | POA: Diagnosis not present

## 2023-06-10 DIAGNOSIS — M1712 Unilateral primary osteoarthritis, left knee: Secondary | ICD-10-CM | POA: Diagnosis not present

## 2023-06-10 DIAGNOSIS — Z96651 Presence of right artificial knee joint: Secondary | ICD-10-CM | POA: Diagnosis not present

## 2023-06-10 MED ORDER — SEMAGLUTIDE-WEIGHT MANAGEMENT 1 MG/0.5ML ~~LOC~~ SOAJ: 1.0000 mg | SUBCUTANEOUS | 1 refills | Status: DC

## 2023-06-10 MED ORDER — AMLODIPINE BESYLATE 10 MG PO TABS: 10.0000 mg | ORAL_TABLET | Freq: Every day | ORAL | 1 refills | Status: DC

## 2023-06-10 MED ORDER — NICOTINE 21 MG/24HR TD PT24: 21.0000 mg | MEDICATED_PATCH | Freq: Every day | TRANSDERMAL | 3 refills | Status: DC

## 2023-06-10 MED ORDER — ROSUVASTATIN CALCIUM 5 MG PO TABS: 5.0000 mg | ORAL_TABLET | ORAL | 3 refills | Status: AC

## 2023-06-10 MED ORDER — CELECOXIB 200 MG PO CAPS: 200.0000 mg | ORAL_CAPSULE | Freq: Two times a day (BID) | ORAL | 2 refills | Status: DC

## 2023-06-10 NOTE — Assessment & Plan Note (Signed)
 She has been on this dose of Wegovy since January. She has lost 25 pounds over the past two months, which has positively impacted her health, including alleviating back pain and improving blood pressure. She is experiencing some food aversions and constipation, which are common side effects of the medication. She reports improved satiety and is learning to manage portion sizes to avoid discomfort. - Continue Wegovy 1 mg with refills sent to [facility] - Advise to hold Wegovy two weeks prior to knee replacement surgery due to potential for slowed digestion - Encourage increased fiber intake through fruits and vegetables to manage constipation - Discuss the importance of stopping when feeling full to avoid discomfort

## 2023-06-10 NOTE — Assessment & Plan Note (Signed)
 Her blood pressure is well-controlled without the use of Maxzide, likely due to weight loss. She has not been taking the medication - Discontinue Triamterene-HCTZ as blood pressure is well-controlled without it - continue amlodipine at current dose

## 2023-06-10 NOTE — Progress Notes (Signed)
 Established patient visit   Patient: Alexa Newman   DOB: 08/13/1960   63 y.o. Female  MRN: [id number] Visit Date: 06/10/2023  Today's healthcare provider: Shirlee Latch, MD   Chief Complaint  Patient presents with   Medical Management of Chronic Issues    9 week weight mgmt follow-up and 2nd shingles vaccine   Obesity    Tolerating well this week and if nausea she will take something or eat and will be fine.  Wanting to stay on longer   Subjective    HPI HPI     Medical Management of Chronic Issues    Additional comments: 9 week weight mgmt follow-up and 2nd shingles vaccine        Obesity    Additional comments: Tolerating well this week and if nausea she will take something or eat and will be fine.  Wanting to stay on longer      Last edited by Acey Lav, CMA on 06/10/2023  1:31 PM.       Discussed the use of AI scribe software for clinical note transcription with the patient, who gave verbal consent to proceed.  History of Present Illness   The patient, with a history of obesity, hypertension, hyperlipidemia, and nicotine dependence, presents for a follow-up visit after starting [facility] for weight loss. She reports significant improvement, with a weight loss of approximately 25 pounds over the past two months. She notes that her back pain has significantly improved with the weight loss. She is currently on a 1mg  dose of Wegovy and wishes to maintain this dosage.  The patient also reports that she is scheduled for a left knee replacement due to chronic knee pain. She has been advised to hold the [facility] for two weeks prior to the surgery due to potential effects on digestion.  The patient has experienced some side effects from the [facility], including constipation and heartburn, but these symptoms appear to be resolving. She has made dietary changes, including increased fiber intake, to manage these side effects. She notes some food aversions, particularly  to meat, but is managing to maintain a balanced diet with alternative protein sources.  The patient has also resumed using a nicotine patch in an attempt to quit smoking, particularly in preparation for her upcoming surgery. She notes that she does not smoke when staying with her daughter, who is a non-smoker.         Medications: Outpatient Medications Prior to Visit  Medication Sig   calcium-vitamin D (OSCAL WITH D) 500-5 MG-MCG tablet Take 1 tablet by mouth.   doxepin (SINEQUAN) 10 MG capsule TAKE 1 CAPSULE (10 MG TOTAL) BY MOUTH AT BEDTIME AS NEEDED. FOR SLEEP   hydrOXYzine (ATARAX) 10 MG tablet TAKE 1 TABLET BY MOUTH 3 TIMES DAILY AS NEEDED FOR ANXIETY.   lamoTRIgine (LAMICTAL) 100 MG tablet TAKE 1 TABLET BY MOUTH TWICE A DAY   methocarbamol (ROBAXIN) 500 MG tablet Take 500 mg by mouth 2 (two) times daily.   UNABLE TO FIND Take 1 tablet by mouth in the morning and at bedtime. Med Name: calcium, magnesium and zinc   [DISCONTINUED] amLODipine (NORVASC) 10 MG tablet TAKE 1 TABLET BY MOUTH EVERY DAY   [DISCONTINUED] celecoxib (CELEBREX) 200 MG capsule Take 1 capsule (200 mg total) by mouth 2 (two) times daily.   [DISCONTINUED] nicotine (NICODERM CQ - DOSED IN MG/24 HOURS) 21 mg/24hr patch Place 1 patch (21 mg total) onto the skin daily.   [DISCONTINUED] rosuvastatin (CRESTOR)  5 MG tablet Take 1 tablet (5 mg total) by mouth every other day. TAKE 1 TABLET BY MOUTH EVERY OTHER DAY   [DISCONTINUED] Semaglutide-Weight Management 1 MG/0.5ML SOAJ Inject 1 mg into the skin once a week.   [DISCONTINUED] triamterene-hydrochlorothiazide (MAXZIDE-25) 37.5-25 MG tablet TAKE 1 TABLET BY MOUTH EVERY DAY (Patient not taking: Reported on 06/10/2023)   No facility-administered medications prior to visit.    Review of Systems     Objective    BP 117/81 (BP Location: Left Arm, Patient Position: Sitting, Cuff Size: Normal)   Pulse 92   Ht 5\' 3"  (1.6 m)   Wt 168 lb (76.2 kg)   LMP 12/07/2011   SpO2 98%    BMI 29.76 kg/m    Physical Exam Vitals reviewed.  Constitutional:      General: She is not in acute distress.    Appearance: Normal appearance. She is well-developed. She is not diaphoretic.  HENT:     Head: Normocephalic and atraumatic.  Eyes:     General: No scleral icterus.    Conjunctiva/sclera: Conjunctivae normal.  Neck:     Thyroid: No thyromegaly.  Cardiovascular:     Rate and Rhythm: Normal rate and regular rhythm.     Heart sounds: Normal heart sounds. No murmur heard. Pulmonary:     Effort: Pulmonary effort is normal. No respiratory distress.     Breath sounds: Normal breath sounds. No wheezing, rhonchi or rales.  Musculoskeletal:     Cervical back: Neck supple.     Right lower leg: No edema.     Left lower leg: No edema.  Lymphadenopathy:     Cervical: No cervical adenopathy.  Skin:    General: Skin is warm and dry.     Findings: No rash.  Neurological:     Mental Status: She is alert and oriented to person, place, and time. Mental status is at baseline.  Psychiatric:        Mood and Affect: Mood normal.        Behavior: Behavior normal.      No results found for any visits on 06/10/23.  Assessment & Plan     Problem List Items Addressed This Visit       Cardiovascular and Mediastinum   Essential hypertension   Her blood pressure is well-controlled without the use of Maxzide, likely due to weight loss. She has not been taking the medication - Discontinue Triamterene-HCTZ as blood pressure is well-controlled without it - continue amlodipine at current dose      Relevant Medications   amLODipine (NORVASC) 10 MG tablet   rosuvastatin (CRESTOR) 5 MG tablet     Other   Tobacco use disorder   She has resumed using the nicotine patch to aid in smoking cessation. She reports not smoking when staying with her daughter, who does not smoke, and expresses a desire to quit smoking, especially in preparation for her upcoming knee surgery. - Continue  nicotine patch with refills sent to [facility] - Encourage smoking cessation, especially in preparation for knee surgery      Obesity   She has been on this dose of Wegovy since January. She has lost 25 pounds over the past two months, which has positively impacted her health, including alleviating back pain and improving blood pressure. She is experiencing some food aversions and constipation, which are common side effects of the medication. She reports improved satiety and is learning to manage portion sizes to avoid discomfort. - Continue Wegovy 1  mg with refills sent to [facility] - Advise to hold [facility] two weeks prior to knee replacement surgery due to potential for slowed digestion - Encourage increased fiber intake through fruits and vegetables to manage constipation - Discuss the importance of stopping when feeling full to avoid discomfort      Relevant Medications   Semaglutide-Weight Management 1 MG/0.5ML SOAJ   Hyperlipidemia   Relevant Medications   amLODipine (NORVASC) 10 MG tablet   rosuvastatin (CRESTOR) 5 MG tablet   Other Visit Diagnoses       Immunization due    -  Primary   Relevant Orders   Varicella-zoster vaccine IM (Completed)     Need for shingles vaccine       Relevant Orders   Varicella-zoster vaccine IM (Completed)          General Health Maintenance She received a shingles vaccination during this visit. No additional labs are required at this time as recent labs were completed in January during her physical. - Administer shingles vaccination       Return in about 3 months (around 09/09/2023) for chronic disease f/u.       Shirlee Latch, MD  [facility] [phone] (phone) [phone] (fax)  [facility]

## 2023-06-10 NOTE — Assessment & Plan Note (Signed)
 She has resumed using the nicotine patch to aid in smoking cessation. She reports not smoking when staying with her daughter, who does not smoke, and expresses a desire to quit smoking, especially in preparation for her upcoming knee surgery. - Continue nicotine patch with refills sent to [facility] - Encourage smoking cessation, especially in preparation for knee surgery

## 2023-06-12 ENCOUNTER — Other Ambulatory Visit: Payer: Self-pay | Admitting: Family Medicine

## 2023-06-18 NOTE — H&P (Signed)
 ORTHOPAEDIC HISTORY & PHYSICAL Abagail Limb, Adelina Mings., MD - 06/10/2023 10:00 AM EDT Formatting of this note is different from the original. Images from the original note were not included. Chief Complaint: Chief Complaint Patient presents with Left knee degenerative arthrosis Total Joint- Follow Up Right total knee arthroplasty 10/02/20  Reason for Visit: The patient is a 63 y.o. female who presents today for reevaluation of both knees. She is almost 3 years status post right total knee arthroplasty. She denies any significant right knee pain, swelling, locking, or giving way. She is not using any ambulatory aids.  She reports a 17-month history of left knee pain. She was getting out of the pool last August when she slipped and twisted the left knee. She had the onset of pain and swelling to the knee and was evaluated in the Emergency Department and placed in a knee immobilizer. She has been followed by Dedra Skeens, PA-C but has continued to have persistent left knee pain despite conservative treatment measures. She localizes most of the pain along the medial aspect of the knee. She reports some swelling, no locking, and some giving way of the knee. The pain is aggravated by going up and down stairs, standing, and walking. The knee pain limits the patient's ability to ambulate long distances. The patient has not appreciated any significant improvement despite NSAIDs, bracing, activity modification, and narcotic analgesia.The patient states that the knee pain has progressed to the point that it is significantly interfering with her activities of daily living.  Medications: Current Outpatient Medications Medication Sig Dispense Refill amLODIPine (NORVASC) 10 MG tablet 10 mg once daily celecoxib (CELEBREX) 200 MG capsule TAKE 1 CAPSULE BY MOUTH TWICE A DAY 30 capsule 1 hydrOXYzine HCL (ATARAX) 10 MG tablet Take 10 mg by mouth once as needed for Anxiety lamoTRIgine (LAMICTAL) 100 MG tablet Take 100  mg by mouth 2 (two) times daily methocarbamoL (ROBAXIN) 500 MG tablet Take 500 mg by mouth 2 (two) times daily nicotine (NICODERM CQ) 21 mg/24 hr patch Place 21 mg onto the skin once daily rosuvastatin (CRESTOR) 5 MG tablet 5 mg every other day traMADoL (ULTRAM) 50 mg tablet Take 1 tablet (50 mg total) by mouth at bedtime as needed for Pain for up to 30 doses 30 tablet 0 triamterene-hydrochlorothiazide (MAXZIDE-25) 37.5-25 mg tablet Take 1 tablet by mouth once daily WEGOVY 0.25 mg/0.5 mL pen injector INJECT 0.25 MG INTO THE SKIN ONCE A WEEK FOR 28 DAYS  No current facility-administered medications for this visit.  Allergies: Allergies Allergen Reactions Pregabalin Other (See Comments) Suicidal ideation  Past Medical History: Past Medical History: Diagnosis Date Anxiety Arthritis Bipolar 1 disorder (CMS/HHS-HCC) Cocaine use disorder, moderate, in sustained remission (CMS/HHS-HCC) Depression Hyperlipidemia 2021 Hypertension 2020 Osteoarthritis 2000  Past Surgical History: Past Surgical History: Procedure Laterality Date TUBAL LIGATION 1990 Arthroscopic surgery right wrist 1991 DILATION AND CURETTAGE, DIAGNOSTIC / THERAPEUTIC 2013 ENDOMETRIAL ABLATION 06/2011 HTA proximal row carpectomy Right 2019 [facility] NEUROPLASTY &/OR TRANSPOSITION MEDIAN NERVE AT CARPAL TUNNEL Right 2019 [facility] Right total knee arthroplasty using computer-assisted navigation Removal of hardware (bone staple) from the right knee 10/02/2020 Dr Ernest Pine ANTERIOR CRUCIATE LIGAMENT REPAIR FRACTURE SURGERY 2000 KNEE ARTHROSCOPY 2000 ACL Replacement Repair of right index finger, crashed bones  Social History: Social History  Socioeconomic History Marital status: Divorced Number of children: 1 Years of education: 14 Occupational History Occupation: RetiredAmbulance person Tobacco Use Smoking status: Every Day Current packs/day: 1.00 Average packs/day: 1 pack/day for 99.3 years (99.3 ttl pk-yrs) Types:  Cigarettes  Start date: 03/09/1972 Smokeless tobacco: Never Tobacco comments: Started at age 35 Vaping Use Vaping status: Never Used Substance and Sexual Activity Alcohol use: Yes Alcohol/week: 1.0 standard drink of alcohol Types: 1 Standard drinks or equivalent per week Comment: 1 x month Drug use: No Comment: in recovery 08/30/2006- former crack cocaine user Sexual activity: Not Currently Partners: Male Birth control/protection: Post-menopausal Comment: sex with men and women Social History Narrative Divorced, lives with roommate. Has two or three caffeinated beverages daily. No guns. Exercises regularly. Always wears seatbelts. She is working as a Theatre stage manager. She has some college education, and is attending college now. Learns best by showing. Sexually active with multiple partners, men and women. Uses condoms.  Social Drivers of Health  Financial Resource Strain: Medium Risk (06/10/2023) Overall Financial Resource Strain (CARDIA) Difficulty of Paying Living Expenses: Somewhat hard Food Insecurity: Food Insecurity Present (06/10/2023) Hunger Vital Sign Worried About Running Out of Food in the Last Year: Often true Ran Out of Food in the Last Year: Often true Transportation Needs: No Transportation Needs (06/10/2023) PRAPARE - Contractor (Medical): No Lack of Transportation (Non-Medical): No Physical Activity: Insufficiently Active (06/06/2023) Received from [facility] Exercise Vital Sign Days of Exercise per Week: 2 days Minutes of Exercise per Session: 20 min Stress: No Stress Concern Present (06/06/2023) Received from [facility] - Occupational Stress Questionnaire Feeling of Stress : Not at all Social Connections: Socially Isolated (06/06/2023) Received from [facility] Social Connection and Isolation Panel [NHANES] Frequency of Communication with Friends and Family: More than three times a week Frequency  of Social Gatherings with Friends and Family: More than three times a week Attends Religious Services: Never Database administrator or Organizations: No Marital Status: Divorced Housing Stability: Low Risk (06/10/2023) Housing Stability Vital Sign Unable to Pay for Housing in the Last Year: No Number of Times Moved in the Last Year: 0 Homeless in the Last Year: No  Family History: Family History Problem Relation Name Age of Onset High blood pressure (Hypertension) Mother Gunnar Fusi Diabetes insipidus Mother Gunnar Fusi Osteoporosis (Thinning of bones) Mother Gunnar Fusi Depression Mother Gunnar Fusi Crohn's disease Mother Gunnar Fusi Lung cancer Mother Gunnar Fusi Diabetes type II Mother Gunnar Fusi Crohn's disease Sister ? Breast cancer Sister High blood pressure (Hypertension) Father Kathlene November Heart disease Father Kathlene November Stroke Father Kathlene November Alcohol abuse Father Kathlene November Heart failure Father Kathlene November Alcohol abuse Brother Casimiro Needle HIV Brother Casimiro Needle High blood pressure (Hypertension) Brother Casimiro Needle Alcohol abuse Brother Renae Fickle HIV Brother Paul High blood pressure (Hypertension) Brother Renae Fickle Breast cancer Sister Elon Jester High blood pressure (Hypertension) Sister Elon Jester Hyperlipidemia (Elevated cholesterol) Sister Elon Jester Diabetes type II Sister Annice Pih  Review of Systems: A comprehensive 14 point ROS was performed, reviewed, and the pertinent orthopaedic findings are documented in the HPI.  Exam BP 124/82  Ht 162.6 cm (5\' 4" )  Wt 75.6 kg (166 lb 9.6 oz)  LMP 06/17/2011  BMI 28.60 kg/m  General: Well-developed, well-nourished female seen in no acute distress. Antalgic gait. No significant valgus or varus thrust to either knee.  HEENT: Atraumatic, normocephalic. Pupils are equal and reactive to light. Extraocular motion is intact. Sclera are clear. Oropharynx is clear with moist mucosa.  Neck: Supple, nontender, and with good ROM. No thyromegaly, adenopathy, JVD, or carotid bruits.  Lungs: Clear to auscultation  bilaterally.  Cardiovascular: Regular rate and rhythm. Normal S1, S2. No murmur . No appreciable gallops or rubs. Peripheral pulses are palpable. No lower extremity edema. Homan`s test is negative.  Abdomen:  Soft, nontender, nondistended. Bowel sounds are present.  Extremities: Good strength, stability, and range of motion of the upper extremities. Good range of motion of the hips and ankles.  Right Knee: Soft tissue swelling: none Effusion: none Erythema: none Crepitance: none Tenderness: No focal tenderness Alignment: normal Mediolateral laxity: stable Posterior sag: negative Patellar tracking: Good tracking without evidence of subluxation or tilt Atrophy: No significant atrophy. Quadriceps tone was fair to good. Range of motion: 0/0/130 degrees  Left Knee: Soft tissue swelling: mild Effusion: minimal Erythema: none Crepitance: minimal Tenderness: medial joint line Alignment: normal Mediolateral laxity: stable Anterior drawer test:negative Lachman`s test: negative McMurray`s test: negative Atrophy: No significant atrophy. Quadriceps tone was fair to good. Range of Motion: 0/0/127 degrees  Neurologic: Awake, alert, and oriented. Sensory function is intact to pinprick and light touch. Motor strength is judged to be 5/5. Motor coordination is within normal limits. No apparent clonus. No tremor.  X-rays: I ordered and interpreted standing AP, lateral, and sunrise radiographs of the left knee that were obtained in the office today. There is significant narrowing of the medial cartilage space with a localized subchondral lesion/collapse at the medial femoral condyle. Osteophyte formation is noted. Subchondral sclerosis is noted. No evidence of fracture or dislocation.  I ordered and interpreted standing AP, lateral, and sunrise views of the right knee that were obtained in the office today. Good position of the total knee implants. Good alignment is noted on the AP  view. Good cement mantle is appreciated without evidence of loosening. No evidence of polyethylene wear or osteolysis. No evidence of fracture or dislocation.  MRI of the left knee: I reviewed the left knee MRI performed at [facility] on 03/31/2023. I concur with the radiologist's interpretation as below:  MRI OF THE LEFT KNEE WITHOUT CONTRAST  TECHNIQUE: Multiplanar, multisequence MR imaging of the knee was performed. No intravenous contrast was administered.  COMPARISON: Knee x-ray 10/18/2022  FINDINGS: MENISCI  Medial: Radial tear of the posterior horn of the medial meniscus towards the meniscal root knee and peripheral meniscal extrusion. Degeneration of the posterior horn of the medial meniscus.  Lateral: Intact.  LIGAMENTS  Cruciates: ACL and PCL are intact.  Collaterals: Medial collateral ligament is intact. Lateral collateral ligament complex is intact.  CARTILAGE  Patellofemoral: Moderate partial-thickness cartilage loss of the medial and lateral patellar facets with subchondral marrow edema. Mild partial-thickness cartilage loss of the medial trochlea.  Medial: High-grade partial-thickness cartilage loss of the weight-bearing medial femoral condyle and moderate partial-thickness cartilage loss of the medial tibial plateau.  Lateral: Mild partial-thickness cartilage loss of the weight-bearing lateral femorotibial compartment.  JOINT: Small joint effusion. Normal Hoffa's fat-pad. No plical thickening.  POPLITEAL FOSSA: Popliteus tendon is intact. No Baker's cyst.  EXTENSOR MECHANISM: Intact quadriceps tendon. Intact patellar tendon. Intact lateral patellar retinaculum. Intact medial patellar retinaculum. Intact MPFL.  BONES: No aggressive osseous lesion. No fracture or dislocation. Subchondral low signal and mild marrow edema likely reflecting subacute-chronic nondisplaced, nondepressed subchondral insufficiency fracture.  Other: No  fluid collection or hematoma. Muscles are normal.  IMPRESSION: 1. Radial tear of the posterior horn of the medial meniscus towards the meniscal root knee and peripheral meniscal extrusion. 2. Tricompartmental cartilage abnormalities as described above most severe in the medial femorotibial compartment. 3. Subchondral low signal and mild marrow edema likely reflecting subacute-chronic nondisplaced, nondepressed subchondral insufficiency fracture.  Electronically Signed By: Elige Ko M.D. On: 04/02/2023 05:51  Impression: Degenerative arthrosis of the left knee Right total knee arthroplasty  Plan: The findings were discussed in detail with the patient. She continues to do well with regards to the right knee.  Conservative treatment options for the left knee were reviewed with the patient. We discussed the risks and benefits of surgical intervention. Arthroscopy is an appropriate treatment for the meniscal pathology, but would have limited or no effect on degenerative changes of the articular cartilage. Given the tricompartmental degenerative changes, total knee arthroplasty would be a more appropriate intervention. The usual perioperative course was also discussed in detail. The patient expressed understanding of the risks and benefits of surgical intervention and would like to proceed with plans for left total knee arthroplasty.  I spent a total of 45 minutes in both face-to-face and non-face-to-face activities, excluding procedures performed, for this visit on the date of this encounter.  MEDICAL CLEARANCE: Per anesthesiology. ACTIVITY: As tolerated. WORK STATUS: Not applicable. THERAPY: Preoperative physical therapy evaluation. MEDICATIONS: Requested Prescriptions  No prescriptions requested or ordered in this encounter  FOLLOW-UP: Return for preop History & Physical pending surgery date.  Malachai Schalk P. Angie Fava., M.D.  This note was generated in part with voice  recognition software and I apologize for any typographical errors that were not detected and corrected.

## 2023-06-18 NOTE — Discharge Instructions (Signed)
 Instructions after Total Knee Replacement   Alexa Newman P. Angie Fava., M.D.    Dept. of Orthopaedics & Sports Medicine [facility] [address] [place], [place]  [address]  Phone: [phone]   Fax: [phone]       [email or web address]       DIET: Drink plenty of non-alcoholic fluids. Resume your normal diet. Include foods high in fiber.  ACTIVITY:  You may use crutches or a walker with weight-bearing as tolerated, unless instructed otherwise. You may be weaned off of the walker or crutches by your Physical Therapist.  Do NOT place pillows under the knee. Anything placed under the knee could limit your ability to straighten the knee.   Use the Bone Foam 3 times a day for 30 minutes each session to help straighten the knee. Continue doing gentle exercises. Exercising will reduce the pain and swelling, increase motion, and prevent muscle weakness.   Please continue to use the TED compression stockings for 6 weeks. You may remove the stockings at night, but should reapply them in the morning. Do not drive or operate any equipment until instructed.  WOUND CARE:  The initial dressing (Aquacel) can remain in place for 7 days (see separate instructions). Continue to use the PolarCare or ice packs periodically to reduce pain and swelling. You may bathe or shower after the staples are removed at the first office visit following surgery.  MEDICATIONS: You may resume your regular medications. Please take the pain medication as prescribed on the medication. Do not take pain medication on an empty stomach. Unless instructed otherwise, you should take an enteric-coated aspirin 81 mg. TWICE a day. (This along with elevation will help reduce the possibility of blood clots/phlebitis in your operated leg.) Use a stool softener (such as Senokot-S or Colace) daily and a laxative (such as Miralax or Dulcolax) as needed to prevent constipation.  Do not drive or drink alcoholic beverages when  taking pain medications.  CALL THE OFFICE FOR: Temperature above 101 degrees Excessive bleeding or drainage on the dressing. Excessive swelling, coldness, or paleness of the toes. Persistent nausea and vomiting.  FOLLOW-UP:  You should have an appointment to return to the office in 10-14 days after surgery. Arrangements have been made for continuation of Physical Therapy (either home therapy or outpatient therapy).     [facility] Department Directory         [email or web address]       [email or web address]          Cardiology  Appointments: [place] - [phone]  Endocrinology  Appointments: [place] [phone] [place] - [phone]  Gastroenterology  Appointments: [employer] [phone] [place] - [phone]        General Surgery   Appointments: [facility]  Internal Medicine/Family Medicine  Appointments: [facility] [place] - [phone] [place] - [phone]  Metabolic and Weigh Loss Surgery  Appointments: [facility]        Neurology  Appointments: [place] [phone] [place] - [phone]  Neurosurgery  Appointments: [place]  Obstetrics & Gynecology  Appointments: [place] [phone] [place] - [phone]        Pediatrics  Appointments: [place] [phone] [place] - [phone]  Physiatry  Appointments: [place] [phone]  Physical Therapy  Appointments: [place] - [phone]        Podiatry  Appointments: [place] [phone] [place] - [phone]  Pulmonology  Appointments: [facility] [facility]  Rheumatology  Appointments: [place] [phone]  [place] Location: [facility]  [address] [place], [place]  [address]  [place] Location: [facility]. [address] [place], [place]  [address]  [place]  Location: [facility] [address] [place], [place]  [address]

## 2023-06-22 ENCOUNTER — Encounter
Admission: RE | Admit: 2023-06-22 | Discharge: 2023-06-22 | Disposition: A | Discharge status: Home / Self Care | Source: Ambulatory Visit | Attending: Orthopedic Surgery | Admitting: Orthopedic Surgery

## 2023-06-22 ENCOUNTER — Other Ambulatory Visit: Payer: Self-pay

## 2023-06-22 VITALS — BP 109/77 | HR 88 | Resp 14 | Ht 63.0 in | Wt 165.0 lb

## 2023-06-22 DIAGNOSIS — E876 Hypokalemia: Secondary | ICD-10-CM | POA: Insufficient documentation

## 2023-06-22 DIAGNOSIS — R5382 Chronic fatigue, unspecified: Secondary | ICD-10-CM | POA: Diagnosis not present

## 2023-06-22 DIAGNOSIS — Z01812 Encounter for preprocedural laboratory examination: Secondary | ICD-10-CM

## 2023-06-22 DIAGNOSIS — Z01818 Encounter for other preprocedural examination: Secondary | ICD-10-CM | POA: Insufficient documentation

## 2023-06-22 DIAGNOSIS — M1712 Unilateral primary osteoarthritis, left knee: Secondary | ICD-10-CM | POA: Insufficient documentation

## 2023-06-22 DIAGNOSIS — R8290 Unspecified abnormal findings in urine: Secondary | ICD-10-CM

## 2023-06-22 DIAGNOSIS — R8271 Bacteriuria: Secondary | ICD-10-CM

## 2023-06-22 LAB — URINALYSIS, ROUTINE W REFLEX MICROSCOPIC
Bilirubin Urine: NEGATIVE
Glucose, UA: NEGATIVE mg/dL
Ketones, ur: NEGATIVE mg/dL
Leukocytes,Ua: NEGATIVE
Nitrite: POSITIVE — AB
Protein, ur: 30 mg/dL — AB
Specific Gravity, Urine: 1.013 (ref 1.005–1.030)
pH: 6 (ref 5.0–8.0)

## 2023-06-22 LAB — SEDIMENTATION RATE: Sed Rate: 12 mm/h (ref 0–30)

## 2023-06-22 LAB — GENECONNECT MOLECULAR SCREEN: Genetic Analysis Overall Interpretation: NEGATIVE

## 2023-06-22 LAB — COMPREHENSIVE METABOLIC PANEL WITH GFR
ALT: 18 U/L (ref 0–44)
AST: 16 U/L (ref 15–41)
Albumin: 4.1 g/dL (ref 3.5–5.0)
Alkaline Phosphatase: 74 U/L (ref 38–126)
Anion gap: 8 (ref 5–15)
BUN: 12 mg/dL (ref 8–23)
CO2: 26 mmol/L (ref 22–32)
Calcium: 9.7 mg/dL (ref 8.9–10.3)
Chloride: 103 mmol/L (ref 98–111)
Creatinine, Ser: 0.91 mg/dL (ref 0.44–1.00)
GFR, Estimated: >60 mL/min (ref >60)
Glucose, Bld: 85 mg/dL (ref 70–99)
Potassium: 3.2 mmol/L — ABNORMAL LOW (ref 3.5–5.1)
Sodium: 137 mmol/L (ref 135–145)
Total Bilirubin: 0.7 mg/dL (ref 0.0–1.2)
Total Protein: 7.1 g/dL (ref 6.5–8.1)

## 2023-06-22 LAB — CBC
HCT: 41.6 % (ref 36.0–46.0)
Hemoglobin: 13.4 g/dL (ref 12.0–15.0)
MCH: 26.8 pg (ref 26.0–34.0)
MCHC: 32.2 g/dL (ref 30.0–36.0)
MCV: 83.2 fL (ref 80.0–100.0)
Platelets: 300 10*3/uL (ref 150–400)
RBC: 5 MIL/uL (ref 3.87–5.11)
RDW: 13.9 % (ref 11.5–15.5)
WBC: 8.3 10*3/uL (ref 4.0–10.5)
nRBC: 0 % (ref 0.0–0.2)

## 2023-06-22 LAB — SURGICAL PCR SCREEN
MRSA, PCR: NEGATIVE
Staphylococcus aureus: NEGATIVE

## 2023-06-22 LAB — C-REACTIVE PROTEIN: CRP: <0.5 mg/dL (ref <1.0)

## 2023-06-22 NOTE — Patient Instructions (Addendum)
 Your procedure is scheduled on: Monday 06/28/23 To find out your arrival time, please call [phone] between 1PM - 3PM on:  Friday 06/25/23  Report to the Registration Desk on the 1st floor of the [facility]. FREE Valet parking is available.  If your arrival time is 6:00 am, do not arrive before that time as the [facility] entrance doors do not open until 6:00 am.  REMEMBER: Instructions that are not followed completely may result in serious medical risk, up to and including death; or upon the discretion of your surgeon and anesthesiologist your surgery may need to be rescheduled.  Do not eat food after midnight the night before surgery.  No gum chewing or hard candies.  You may however, drink CLEAR liquids up to 2 hours before you are scheduled to arrive for your surgery. Do not drink anything within 2 hours of your scheduled arrival time.  Clear liquids include: - water  - apple juice without pulp - gatorade (not RED colors) - black coffee or tea (Do NOT add milk or creamers to the coffee or tea) Do NOT drink anything that is not on this list.  In addition, your doctor has ordered for you to drink the provided:  Ensure Pre-Surgery Clear Carbohydrate Drink  Drinking this carbohydrate drink up to two hours before surgery helps to reduce insulin resistance and improve patient outcomes. Please complete drinking 2 hours before scheduled arrival time.  One week prior to surgery: Stop Anti-inflammatories (NSAIDS) such as Advil, Aleve, Ibuprofen, Motrin, Naproxen, Naprosyn and Aspirin based products such as Excedrin, Goody's Powder, BC Powder. STOP IBUPROFEN TODAY 06/22/23 You may however, continue to take Tylenol if needed for pain up until the day of surgery.  Stop ANY OVER THE COUNTER supplements and vitamins until after surgery.  Continue taking all prescribed medications with the exception of the following: WEGOVY (last dose 06/14/23)  TAKE ONLY THESE MEDICATIONS THE MORNING OF  SURGERY WITH A SIP OF WATER:  amLODipine (NORVASC) 10 MG tablet  lamoTRIgine (LAMICTAL) 100 MG tablet  rosuvastatin (CRESTOR) 5 MG tablet if scheduled day to take  No Alcohol for 24 hours before or after surgery.  No Smoking including e-cigarettes for 24 hours before surgery.  No chewable tobacco products for at least 6 hours before surgery.  No nicotine patches on the day of surgery.  Do not use any "recreational" drugs for at least a week (preferably 2 weeks) before your surgery.  Please be advised that the combination of cocaine and anesthesia may have negative outcomes, up to and including death. If you test positive for cocaine, your surgery will be cancelled.  On the morning of surgery brush your teeth with toothpaste and water, you may rinse your mouth with mouthwash if you wish. Do not swallow any toothpaste or mouthwash.  Use CHG Soap or wipes as directed on instruction sheet.  Do not wear lotions, powders, or perfumes on the day of surgery  Do not shave body hair from the neck down from time you start CHG showers on 06/24/23.  Wear comfortable clothing (specific to your surgery type) to the hospital.  Do not wear jewelry, make-up, hairpins, clips or toe and nail polish.  For welded (permanent) jewelry: bracelets, anklets, waist bands, etc.  Please have this removed prior to surgery.  If it is not removed, there is a chance that hospital personnel will need to cut it off on the day of surgery. Contact lenses, hearing aids and dentures may not be worn  into surgery.  Do not bring valuables to the hospital. [facility] is not responsible for any missing/lost belongings or valuables.   Notify your doctor if there is any change in your medical condition (cold, fever, infection).  If you are being discharged the day of surgery, you will not be allowed to drive home. You will need a responsible individual to drive you home and stay with you for 24 hours after surgery.   If you  are taking public transportation, you will need to have a responsible individual with you.  If you are being admitted to the hospital overnight, leave your suitcase in the car. After surgery it may be brought to your room.  In case of increased patient census, it may be necessary for you, the patient, to continue your postoperative care in the Same Day Surgery department.  After surgery, you can help prevent lung complications by doing breathing exercises.  Take deep breaths and cough every 1-2 hours. Your doctor may order a device called an Incentive Spirometer to help you take deep breaths. . Surgery Visitation Policy:  Patients undergoing a surgery or procedure may have two family members or support persons with them as long as the person is not COVID-19 positive or experiencing its symptoms.   Inpatient Visitation:    Visiting hours are 7 a.m. to 8 p.m. Up to four visitors are allowed at one time in a patient room. The visitors may rotate out with other people during the day. One designated support person (adult) may remain overnight.  Please call the Pre-admissions Testing Dept. at [phone] if you have any questions about these instructions.    Pre-operative 5 CHG Bath Instructions   You can play a key role in reducing the risk of infection after surgery. Your skin needs to be as free of germs as possible. You can reduce the number of germs on your skin by washing with CHG (chlorhexidine gluconate) soap before surgery. CHG is an antiseptic soap that kills germs and continues to kill germs even after washing.   DO NOT use if you have an allergy to chlorhexidine/CHG or antibacterial soaps. If your skin becomes reddened or irritated, stop using the CHG and notify one of our RNs at [phone].   Please shower with the CHG soap starting 4 days before surgery using the following schedule:   Thursday 06/24/23 - Monday 06/28/23    Please keep in mind the following:  DO NOT  shave, including legs and underarms, starting the day of your first shower.   You may shave your face at any point before/day of surgery.  Place clean sheets on your bed the day you start using CHG soap. Use a clean washcloth (not used since being washed) for each shower. DO NOT sleep with pets once you start using the CHG.   CHG Shower Instructions:  If you choose to wash your hair and private area, wash first with your normal shampoo/soap.  After you use shampoo/soap, rinse your hair and body thoroughly to remove shampoo/soap residue.  Turn the water OFF and apply about 3 tablespoons (45 ml) of CHG soap to a CLEAN washcloth.  Apply CHG soap ONLY FROM YOUR NECK DOWN TO YOUR TOES (washing for 3-5 minutes)  DO NOT use CHG soap on face, private areas, open wounds, or sores.  Pay special attention to the area where your surgery is being performed.  If you are having back surgery, having someone wash your back for you may be  helpful. Wait 2 minutes after CHG soap is applied, then you may rinse off the CHG soap.  Pat dry with a clean towel  Put on clean clothes/pajamas   If you choose to wear lotion, please use ONLY the CHG-compatible lotions on the back of this paper.     Additional instructions for the day of surgery: DO NOT APPLY any lotions, deodorants, cologne, or perfumes.   Put on clean/comfortable clothes.  Brush your teeth.  Ask your nurse before applying any prescription medications to the skin.      CHG Compatible Lotions   Aveeno Moisturizing lotion  Cetaphil Moisturizing Cream  Cetaphil Moisturizing Lotion  Clairol Herbal Essence Moisturizing Lotion, Dry Skin  Clairol Herbal Essence Moisturizing Lotion, Extra Dry Skin  Clairol Herbal Essence Moisturizing Lotion, Normal Skin  Curel Age Defying Therapeutic Moisturizing Lotion with Alpha Hydroxy  Curel Extreme Care Body Lotion  Curel Soothing Hands Moisturizing Hand Lotion  Curel Therapeutic Moisturizing Cream,  Fragrance-Free  Curel Therapeutic Moisturizing Lotion, Fragrance-Free  Curel Therapeutic Moisturizing Lotion, Original Formula  Eucerin Daily Replenishing Lotion  Eucerin Dry Skin Therapy Plus Alpha Hydroxy Crme  Eucerin Dry Skin Therapy Plus Alpha Hydroxy Lotion  Eucerin Original Crme  Eucerin Original Lotion  Eucerin Plus Crme Eucerin Plus Lotion  Eucerin TriLipid Replenishing Lotion  Keri Anti-Bacterial Hand Lotion  Keri Deep Conditioning Original Lotion Dry Skin Formula Softly Scented  Keri Deep Conditioning Original Lotion, Fragrance Free Sensitive Skin Formula  Keri Lotion Fast Absorbing Fragrance Free Sensitive Skin Formula  Keri Lotion Fast Absorbing Softly Scented Dry Skin Formula  Keri Original Lotion  Keri Skin Renewal Lotion Keri Silky Smooth Lotion  Keri Silky Smooth Sensitive Skin Lotion  Nivea Body Creamy Conditioning Oil  Nivea Body Extra Enriched Lotion  Nivea Body Original Lotion  Nivea Body Sheer Moisturizing Lotion Nivea Crme  Nivea Skin Firming Lotion  NutraDerm 30 Skin Lotion  NutraDerm Skin Lotion  NutraDerm Therapeutic Skin Cream  NutraDerm Therapeutic Skin Lotion  ProShield Protective Hand Cream  Provon moisturizing lotion  How to Use an Incentive Spirometer  An incentive spirometer is a tool that measures how well you are filling your lungs with each breath. Learning to take long, deep breaths using this tool can help you keep your lungs clear and active. This may help to reverse or lessen your chance of developing breathing (pulmonary) problems, especially infection. You may be asked to use a spirometer: After a surgery. If you have a lung problem or a history of smoking. After a long period of time when you have been unable to move or be active. If the spirometer includes an indicator to show the highest number that you have reached, your health care provider or respiratory therapist will help you set a goal. Keep a log of your progress as told by  your health care provider. What are the risks? Breathing too quickly may cause dizziness or cause you to pass out. Take your time so you do not get dizzy or light-headed. If you are in pain, you may need to take pain medicine before doing incentive spirometry. It is harder to take a deep breath if you are having pain. How to use your incentive spirometer  Sit up on the edge of your bed or on a chair. Hold the incentive spirometer so that it is in an upright position. Before you use the spirometer, breathe out normally. Place the mouthpiece in your mouth. Make sure your lips are closed tightly around it. Breathe in  slowly and as deeply as you can through your mouth, causing the piston or the ball to rise toward the top of the chamber. Hold your breath for 3-5 seconds, or for as long as possible. If the spirometer includes a coach indicator, use this to guide you in breathing. Slow down your breathing if the indicator goes above the marked areas. Remove the mouthpiece from your mouth and breathe out normally. The piston or ball will return to the bottom of the chamber. Rest for a few seconds, then repeat the steps 10 or more times. Take your time and take a few normal breaths between deep breaths so that you do not get dizzy or light-headed. Do this every 1-2 hours when you are awake. If the spirometer includes a goal marker to show the highest number you have reached (best effort), use this as a goal to work toward during each repetition. After each set of 10 deep breaths, cough a few times. This will help to make sure that your lungs are clear. If you have an incision on your chest or abdomen from surgery, place a pillow or a rolled-up towel firmly against the incision when you cough. This can help to reduce pain while taking deep breaths and coughing. General tips When you are able to get out of bed: Walk around often. Continue to take deep breaths and cough in order to clear your  lungs. Keep using the incentive spirometer until your health care provider says it is okay to stop using it. If you have been in the hospital, you may be told to keep using the spirometer at home. Contact a health care provider if: You are having difficulty using the spirometer. You have trouble using the spirometer as often as instructed. Your pain medicine is not giving enough relief for you to use the spirometer as told. You have a fever. Get help right away if: You develop shortness of breath. You develop a cough with bloody mucus from the lungs. You have fluid or blood coming from an incision site after you cough. Summary An incentive spirometer is a tool that can help you learn to take long, deep breaths to keep your lungs clear and active. You may be asked to use a spirometer after a surgery, if you have a lung problem or a history of smoking, or if you have been inactive for a long period of time. Use your incentive spirometer as instructed every 1-2 hours while you are awake. If you have an incision on your chest or abdomen, place a pillow or a rolled-up towel firmly against your incision when you cough. This will help to reduce pain. Get help right away if you have shortness of breath, you cough up bloody mucus, or blood comes from your incision when you cough. This information is not intended to replace advice given to you by your health care provider. Make sure you discuss any questions you have with your health care provider. Document Revised: 05/15/2019 Document Reviewed: 05/15/2019 Elsevier Patient Education  2023 [employer].

## 2023-06-22 NOTE — Progress Notes (Signed)
  [facility] Perioperative Services: Pre-Admission/Anesthesia Testing  Abnormal Lab Notification   Date: 06/22/23  Name: Alexa Newman MRN:   [id number]  Re: Abnormal labs noted during PAT appointment   Notified:  Provider Name Provider Role Notification Mode  Alveria Johann, MD Orthopedics (Surgeon) Routed and/or faxed via [facility]   Abnormal Lab Value(s):   Lab Results  Component Value Date   COLORURINE YELLOW (A) 06/22/2023   APPEARANCEUR CLOUDY (A) 06/22/2023   LABSPEC 1.013 06/22/2023   PHURINE 6.0 06/22/2023   GLUCOSEU NEGATIVE 06/22/2023   HGBUR SMALL (A) 06/22/2023   BILIRUBINUR NEGATIVE 06/22/2023   KETONESUR NEGATIVE 06/22/2023   PROTEINUR 30 (A) 06/22/2023   NITRITE POSITIVE (A) 06/22/2023   LEUKOCYTESUR NEGATIVE 06/22/2023   EPIU 0-5 06/22/2023   WBCU 11-20 06/22/2023   RBCU 6-10 06/22/2023   BACTERIA MANY (A) 06/22/2023   Clinical Information and Notes:  Patient is scheduled for ARTHROPLASTY, KNEE, TOTAL, USING IMAGELESS COMPUTER-ASSISTED NAVIGATION (Left: Knee) on 06/28/2023.    UA performed in PAT consistent with/concerning for infection.  No leukocytosis noted on CBC; WBC 8.3 Renal function: Estimated Creatinine Clearance: 62.1 mL/min (by C-G formula based on SCr of 0.91 mg/dL). Urine C&S added to assess for pathogenically significant growth.  Impression and Plan:  Carlyn V Jarrells with a UA that was (+) for infection; reflex culture sent. Pathogen ID and susceptibilities pending. Will plan on forwarding final culture results to MD as they become available to me. Sending results for review and consideration of preoperative treatment as deemed appropriate by Dr. Hooten.   Renate Caroline, MSN, APRN, FNP-C, CEN [facility]  Perioperative Services Nurse Practitioner Phone: [phone] Fax: [phone] 06/22/23 12:11 PM  NOTE: This note has been prepared using Dragon dictation software. Despite my best ability to  proofread, there is always the potential that unintentional transcriptional errors may still occur from this process.

## 2023-06-24 LAB — URINE CULTURE: Culture: >=100000 — AB

## 2023-06-27 ENCOUNTER — Encounter: Payer: Self-pay | Admitting: Orthopedic Surgery

## 2023-06-28 ENCOUNTER — Other Ambulatory Visit: Payer: Self-pay

## 2023-06-28 ENCOUNTER — Observation Stay

## 2023-06-28 ENCOUNTER — Encounter: Payer: Self-pay | Admitting: Orthopedic Surgery

## 2023-06-28 ENCOUNTER — Observation Stay
Admission: RE | Admit: 2023-06-28 | Discharge: 2023-06-29 | Disposition: A | Discharge status: Home / Self Care | Source: Ambulatory Visit | Attending: Orthopedic Surgery | Admitting: Orthopedic Surgery

## 2023-06-28 ENCOUNTER — Encounter
Admission: RE | Disposition: A | Discharge status: Home / Self Care | Payer: Self-pay | Source: Ambulatory Visit | Attending: Orthopedic Surgery

## 2023-06-28 ENCOUNTER — Ambulatory Visit: Payer: Self-pay | Admitting: Urgent Care

## 2023-06-28 ENCOUNTER — Ambulatory Visit: Admitting: Anesthesiology

## 2023-06-28 DIAGNOSIS — M1712 Unilateral primary osteoarthritis, left knee: Principal | ICD-10-CM | POA: Insufficient documentation

## 2023-06-28 DIAGNOSIS — R609 Edema, unspecified: Secondary | ICD-10-CM | POA: Diagnosis not present

## 2023-06-28 DIAGNOSIS — E876 Hypokalemia: Secondary | ICD-10-CM

## 2023-06-28 DIAGNOSIS — Z79899 Other long term (current) drug therapy: Secondary | ICD-10-CM | POA: Insufficient documentation

## 2023-06-28 DIAGNOSIS — I10 Essential (primary) hypertension: Secondary | ICD-10-CM | POA: Insufficient documentation

## 2023-06-28 DIAGNOSIS — F17210 Nicotine dependence, cigarettes, uncomplicated: Secondary | ICD-10-CM | POA: Diagnosis not present

## 2023-06-28 DIAGNOSIS — Z9621 Cochlear implant status: Secondary | ICD-10-CM | POA: Insufficient documentation

## 2023-06-28 DIAGNOSIS — R5382 Chronic fatigue, unspecified: Secondary | ICD-10-CM

## 2023-06-28 DIAGNOSIS — Z96652 Presence of left artificial knee joint: Secondary | ICD-10-CM | POA: Diagnosis not present

## 2023-06-28 DIAGNOSIS — F17200 Nicotine dependence, unspecified, uncomplicated: Secondary | ICD-10-CM | POA: Diagnosis not present

## 2023-06-28 HISTORY — PX: KNEE ARTHROPLASTY: SHX992

## 2023-06-28 SURGERY — ARTHROPLASTY, KNEE, TOTAL, USING IMAGELESS COMPUTER-ASSISTED NAVIGATION
Anesthesia: Spinal | Site: Knee | Laterality: Left

## 2023-06-28 MED ORDER — PANTOPRAZOLE SODIUM 40 MG PO TBEC
40.0000 mg | DELAYED_RELEASE_TABLET | Freq: Two times a day (BID) | ORAL | Status: DC
  Administered 2023-06-28 – 2023-06-29 (×2): 40 mg via ORAL
  Filled 2023-06-28 (×2): qty 1

## 2023-06-28 MED ORDER — OXYCODONE HCL 5 MG PO TABS: 5.0000 mg | ORAL_TABLET | ORAL | Status: DC | PRN

## 2023-06-28 MED ORDER — TRAMADOL HCL 50 MG PO TABS
50.0000 mg | ORAL_TABLET | ORAL | Status: DC | PRN
  Administered 2023-06-28 – 2023-06-29 (×3): 100 mg via ORAL
  Filled 2023-06-28 (×3): qty 2

## 2023-06-28 MED ORDER — PHENYLEPHRINE HCL-NACL 20-0.9 MG/250ML-% IV SOLN
INTRAVENOUS | Status: DC | PRN
  Administered 2023-06-28: 32 ug/min via INTRAVENOUS

## 2023-06-28 MED ORDER — ROSUVASTATIN CALCIUM 5 MG PO TABS
5.0000 mg | ORAL_TABLET | ORAL | Status: DC
  Administered 2023-06-29: 5 mg via ORAL
  Filled 2023-06-28: qty 1

## 2023-06-28 MED ORDER — FLEET ENEMA RE ENEM: 1.0000 | ENEMA | Freq: Once | RECTAL | Status: DC | PRN

## 2023-06-28 MED ORDER — BISACODYL 10 MG RE SUPP: 10.0000 mg | Freq: Every day | RECTAL | Status: DC | PRN

## 2023-06-28 MED ORDER — CELECOXIB 200 MG PO CAPS
ORAL_CAPSULE | ORAL | Status: AC
  Filled 2023-06-28: qty 2

## 2023-06-28 MED ORDER — LAMOTRIGINE 25 MG PO TABS
100.0000 mg | ORAL_TABLET | Freq: Two times a day (BID) | ORAL | Status: DC
  Administered 2023-06-28 – 2023-06-29 (×2): 100 mg via ORAL
  Filled 2023-06-28 (×2): qty 4

## 2023-06-28 MED ORDER — PROPOFOL 10 MG/ML IV BOLUS: INTRAVENOUS | Status: DC | PRN

## 2023-06-28 MED ORDER — FENTANYL CITRATE (PF) 100 MCG/2ML IJ SOLN
INTRAMUSCULAR | Status: AC
  Filled 2023-06-28: qty 2

## 2023-06-28 MED ORDER — ACETAMINOPHEN 10 MG/ML IV SOLN
INTRAVENOUS | Status: DC | PRN
  Administered 2023-06-28: 1000 mg via INTRAVENOUS

## 2023-06-28 MED ORDER — NICOTINE 21 MG/24HR TD PT24
21.0000 mg | MEDICATED_PATCH | Freq: Every day | TRANSDERMAL | Status: DC
  Administered 2023-06-28: 21 mg via TRANSDERMAL
  Filled 2023-06-28: qty 1

## 2023-06-28 MED ORDER — MIDAZOLAM HCL 5 MG/5ML IJ SOLN
INTRAMUSCULAR | Status: DC | PRN
  Administered 2023-06-28: 2 mg via INTRAVENOUS

## 2023-06-28 MED ORDER — ASPIRIN 81 MG PO CHEW
81.0000 mg | CHEWABLE_TABLET | Freq: Two times a day (BID) | ORAL | Status: DC
  Administered 2023-06-28 – 2023-06-29 (×2): 81 mg via ORAL
  Filled 2023-06-28 (×2): qty 1

## 2023-06-28 MED ORDER — PROPOFOL 1000 MG/100ML IV EMUL
INTRAVENOUS | Status: AC
  Filled 2023-06-28: qty 100

## 2023-06-28 MED ORDER — AMLODIPINE BESYLATE 10 MG PO TABS
10.0000 mg | ORAL_TABLET | Freq: Every day | ORAL | Status: DC
  Administered 2023-06-29: 10 mg via ORAL
  Filled 2023-06-28: qty 1

## 2023-06-28 MED ORDER — SODIUM CHLORIDE 0.9 % IV SOLN
INTRAVENOUS | Status: DC | PRN
  Administered 2023-06-28: 60 mL

## 2023-06-28 MED ORDER — LACTATED RINGERS IV SOLN: INTRAVENOUS | Status: DC

## 2023-06-28 MED ORDER — PROPOFOL 1000 MG/100ML IV EMUL
INTRAVENOUS | Status: AC
  Filled 2023-06-28: qty 200

## 2023-06-28 MED ORDER — FERROUS SULFATE 325 (65 FE) MG PO TABS
325.0000 mg | ORAL_TABLET | Freq: Two times a day (BID) | ORAL | Status: DC
  Administered 2023-06-29: 325 mg via ORAL
  Filled 2023-06-28: qty 1

## 2023-06-28 MED ORDER — CELECOXIB 200 MG PO CAPS
400.0000 mg | ORAL_CAPSULE | Freq: Once | ORAL | Status: AC
  Administered 2023-06-28: 400 mg via ORAL

## 2023-06-28 MED ORDER — ACETAMINOPHEN 325 MG PO TABS: 325.0000 mg | ORAL_TABLET | Freq: Four times a day (QID) | ORAL | Status: DC | PRN

## 2023-06-28 MED ORDER — CHLORHEXIDINE GLUCONATE 0.12 % MT SOLN
15.0000 mL | Freq: Once | OROMUCOSAL | Status: AC
  Administered 2023-06-28: 15 mL via OROMUCOSAL

## 2023-06-28 MED ORDER — PHENOL 1.4 % MT LIQD: 1.0000 | OROMUCOSAL | Status: DC | PRN

## 2023-06-28 MED ORDER — SENNOSIDES-DOCUSATE SODIUM 8.6-50 MG PO TABS
1.0000 | ORAL_TABLET | Freq: Two times a day (BID) | ORAL | Status: DC
  Administered 2023-06-28 – 2023-06-29 (×2): 1 via ORAL
  Filled 2023-06-28 (×2): qty 1

## 2023-06-28 MED ORDER — CELECOXIB 200 MG PO CAPS
200.0000 mg | ORAL_CAPSULE | Freq: Two times a day (BID) | ORAL | Status: DC
  Administered 2023-06-28 – 2023-06-29 (×2): 200 mg via ORAL
  Filled 2023-06-28 (×2): qty 1

## 2023-06-28 MED ORDER — TRANEXAMIC ACID-NACL 1000-0.7 MG/100ML-% IV SOLN
INTRAVENOUS | Status: AC
  Filled 2023-06-28: qty 100

## 2023-06-28 MED ORDER — ACETAMINOPHEN 10 MG/ML IV SOLN
1000.0000 mg | Freq: Four times a day (QID) | INTRAVENOUS | Status: DC
  Administered 2023-06-28 – 2023-06-29 (×3): 1000 mg via INTRAVENOUS
  Filled 2023-06-28 (×2): qty 100

## 2023-06-28 MED ORDER — ACETAMINOPHEN 10 MG/ML IV SOLN
INTRAVENOUS | Status: AC
  Filled 2023-06-28: qty 100

## 2023-06-28 MED ORDER — ORAL CARE MOUTH RINSE: 15.0000 mL | Freq: Once | OROMUCOSAL | Status: AC

## 2023-06-28 MED ORDER — DEXAMETHASONE SODIUM PHOSPHATE 10 MG/ML IJ SOLN
8.0000 mg | Freq: Once | INTRAMUSCULAR | Status: AC
  Administered 2023-06-28: 8 mg via INTRAVENOUS

## 2023-06-28 MED ORDER — GABAPENTIN 300 MG PO CAPS
300.0000 mg | ORAL_CAPSULE | Freq: Once | ORAL | Status: AC
  Administered 2023-06-28: 300 mg via ORAL

## 2023-06-28 MED ORDER — PROPOFOL 500 MG/50ML IV EMUL
INTRAVENOUS | Status: DC | PRN
  Administered 2023-06-28: 30 mg via INTRAVENOUS
  Administered 2023-06-28: 140 ug/kg/min via INTRAVENOUS
  Administered 2023-06-28: 80 ug/kg/min via INTRAVENOUS
  Administered 2023-06-28 (×2): 20 mg via INTRAVENOUS

## 2023-06-28 MED ORDER — MENTHOL 3 MG MT LOZG: 1.0000 | LOZENGE | OROMUCOSAL | Status: DC | PRN

## 2023-06-28 MED ORDER — ENSURE PRE-SURGERY PO LIQD
296.0000 mL | Freq: Once | ORAL | Status: AC
  Administered 2023-06-28: 296 mL via ORAL
  Filled 2023-06-28: qty 296

## 2023-06-28 MED ORDER — CEFAZOLIN SODIUM-DEXTROSE 2-4 GM/100ML-% IV SOLN
INTRAVENOUS | Status: AC
  Filled 2023-06-28: qty 100

## 2023-06-28 MED ORDER — OXYCODONE HCL 5 MG PO TABS
10.0000 mg | ORAL_TABLET | ORAL | Status: DC | PRN
  Administered 2023-06-28 – 2023-06-29 (×4): 10 mg via ORAL
  Filled 2023-06-28 (×2): qty 2

## 2023-06-28 MED ORDER — FENTANYL CITRATE (PF) 100 MCG/2ML IJ SOLN: 25.0000 ug | INTRAMUSCULAR | Status: DC | PRN

## 2023-06-28 MED ORDER — BUPIVACAINE HCL (PF) 0.5 % IJ SOLN
INTRAMUSCULAR | Status: DC | PRN
  Administered 2023-06-28: 3 mL

## 2023-06-28 MED ORDER — ONDANSETRON HCL 4 MG/2ML IJ SOLN
4.0000 mg | Freq: Four times a day (QID) | INTRAMUSCULAR | Status: DC | PRN
  Administered 2023-06-29: 4 mg via INTRAVENOUS
  Filled 2023-06-28: qty 2

## 2023-06-28 MED ORDER — CHLORHEXIDINE GLUCONATE 4 % EX SOLN
60.0000 mL | Freq: Once | CUTANEOUS | Status: AC
  Administered 2023-06-28: 4 via TOPICAL

## 2023-06-28 MED ORDER — METHOCARBAMOL 500 MG PO TABS
500.0000 mg | ORAL_TABLET | Freq: Two times a day (BID) | ORAL | Status: DC
  Administered 2023-06-28 – 2023-06-29 (×2): 500 mg via ORAL
  Filled 2023-06-28 (×2): qty 1

## 2023-06-28 MED ORDER — CEFAZOLIN SODIUM-DEXTROSE 2-4 GM/100ML-% IV SOLN
2.0000 g | INTRAVENOUS | Status: AC
  Administered 2023-06-28: 2 g via INTRAVENOUS

## 2023-06-28 MED ORDER — DEXAMETHASONE SODIUM PHOSPHATE 10 MG/ML IJ SOLN
INTRAMUSCULAR | Status: AC
  Filled 2023-06-28: qty 1

## 2023-06-28 MED ORDER — SODIUM CHLORIDE 0.9 % IV SOLN: INTRAVENOUS | Status: DC

## 2023-06-28 MED ORDER — TRANEXAMIC ACID-NACL 1000-0.7 MG/100ML-% IV SOLN
1000.0000 mg | INTRAVENOUS | Status: AC
  Administered 2023-06-28: 1000 mg via INTRAVENOUS

## 2023-06-28 MED ORDER — ALUM & MAG HYDROXIDE-SIMETH 200-200-20 MG/5ML PO SUSP: 30.0000 mL | ORAL | Status: DC | PRN

## 2023-06-28 MED ORDER — HYDROMORPHONE HCL 1 MG/ML IJ SOLN: 0.5000 mg | INTRAMUSCULAR | Status: DC | PRN

## 2023-06-28 MED ORDER — SURGIPHOR WOUND IRRIGATION SYSTEM - OPTIME: TOPICAL | Status: DC | PRN

## 2023-06-28 MED ORDER — METOCLOPRAMIDE HCL 10 MG PO TABS
10.0000 mg | ORAL_TABLET | Freq: Three times a day (TID) | ORAL | Status: DC
  Administered 2023-06-28 – 2023-06-29 (×2): 10 mg via ORAL
  Filled 2023-06-28 (×2): qty 1

## 2023-06-28 MED ORDER — CHLORHEXIDINE GLUCONATE 0.12 % MT SOLN
OROMUCOSAL | Status: AC
  Filled 2023-06-28: qty 15

## 2023-06-28 MED ORDER — TRANEXAMIC ACID-NACL 1000-0.7 MG/100ML-% IV SOLN
1000.0000 mg | Freq: Once | INTRAVENOUS | Status: AC
  Administered 2023-06-28: 1000 mg via INTRAVENOUS

## 2023-06-28 MED ORDER — MIDAZOLAM HCL 2 MG/2ML IJ SOLN
INTRAMUSCULAR | Status: AC
  Filled 2023-06-28: qty 2

## 2023-06-28 MED ORDER — OXYCODONE HCL 5 MG PO TABS
ORAL_TABLET | ORAL | Status: AC
Start: 2023-06-28 — End: ?
  Filled 2023-06-28: qty 2

## 2023-06-28 MED ORDER — SODIUM CHLORIDE 0.9 % IR SOLN
Status: DC | PRN
  Administered 2023-06-28: 3000 mL

## 2023-06-28 MED ORDER — GABAPENTIN 300 MG PO CAPS
ORAL_CAPSULE | ORAL | Status: AC
  Filled 2023-06-28: qty 1

## 2023-06-28 MED ORDER — CEFAZOLIN SODIUM-DEXTROSE 2-4 GM/100ML-% IV SOLN
2.0000 g | Freq: Four times a day (QID) | INTRAVENOUS | Status: AC
  Administered 2023-06-28 (×2): 2 g via INTRAVENOUS
  Filled 2023-06-28: qty 100

## 2023-06-28 MED ORDER — ONDANSETRON HCL 4 MG PO TABS: 4.0000 mg | ORAL_TABLET | Freq: Four times a day (QID) | ORAL | Status: DC | PRN

## 2023-06-28 MED ORDER — DIPHENHYDRAMINE HCL 12.5 MG/5ML PO ELIX: 12.5000 mg | ORAL_SOLUTION | ORAL | Status: DC | PRN

## 2023-06-28 MED ORDER — MAGNESIUM HYDROXIDE 400 MG/5ML PO SUSP
30.0000 mL | Freq: Every day | ORAL | Status: DC
  Filled 2023-06-28: qty 30

## 2023-06-28 MED ORDER — FENTANYL CITRATE (PF) 100 MCG/2ML IJ SOLN
INTRAMUSCULAR | Status: DC | PRN
  Administered 2023-06-28 (×2): 25 ug via INTRAVENOUS

## 2023-06-28 MED ORDER — BUPIVACAINE HCL (PF) 0.25 % IJ SOLN
INTRAMUSCULAR | Status: DC | PRN
  Administered 2023-06-28: 60 mL

## 2023-06-28 SURGICAL SUPPLY — 65 items
ATTUNE PS FEM LT SZ 4 CEM KNEE (Femur) IMPLANT
ATTUNE PSRP INSR SZ4 7 KNEE (Insert) IMPLANT
BASEPLATE TIBIAL ROTATING SZ 4 (Knees) IMPLANT
BATTERY INSTRU NAVIGATION (MISCELLANEOUS) ×4 IMPLANT
BIT DRILL QUICK REL 1/8 2PK SL (BIT) ×1 IMPLANT
BLADE CLIPPER SURG (BLADE) IMPLANT
BLADE SAW 70X12.5 (BLADE) ×1 IMPLANT
BLADE SAW 90X13X1.19 OSCILLAT (BLADE) ×1 IMPLANT
BLADE SAW 90X25X1.19 OSCILLAT (BLADE) ×1 IMPLANT
BNDG STRETCH 4X75 STRL LF (GAUZE/BANDAGES/DRESSINGS) IMPLANT
BRUSH SCRUB EZ PLAIN DRY (MISCELLANEOUS) ×1 IMPLANT
CEMENT BONE GENTAMICIN (Cement) ×2 IMPLANT
CEMENT BONE GENTAMICIN 40 (Cement) IMPLANT
COOLER POLAR GLACIER W/PUMP (MISCELLANEOUS) ×1 IMPLANT
CUFF TRNQT CYL 24X4X16.5-23 (TOURNIQUET CUFF) IMPLANT
CUFF TRNQT CYL 30X4X21-28X (TOURNIQUET CUFF) IMPLANT
DRAPE SHEET LG 3/4 BI-LAMINATE (DRAPES) ×1 IMPLANT
DRSG AQUACEL AG ADV 3.5X14 (GAUZE/BANDAGES/DRESSINGS) ×1 IMPLANT
DRSG MEPILEX SACRM 8.7X9.8 (GAUZE/BANDAGES/DRESSINGS) ×1 IMPLANT
DRSG TEGADERM 4X4.75 (GAUZE/BANDAGES/DRESSINGS) ×1 IMPLANT
DURAPREP 26ML APPLICATOR (WOUND CARE) ×2 IMPLANT
ELECT CAUTERY BLADE 6.4 (BLADE) ×1 IMPLANT
ELECTRODE REM PT RTRN 9FT ADLT (ELECTROSURGICAL) ×1 IMPLANT
EVACUATOR 1/8 PVC DRAIN (DRAIN) ×1 IMPLANT
EX-PIN ORTHOLOCK NAV 4X150 (PIN) ×2 IMPLANT
GAUZE XEROFORM 1X8 LF (GAUZE/BANDAGES/DRESSINGS) ×1 IMPLANT
GLOVE BIO SURGEON STRL SZ7.5 (GLOVE) ×6 IMPLANT
GLOVE BIOGEL PI IND STRL 8 (GLOVE) ×2 IMPLANT
GOWN STRL REUS W/ TWL LRG LVL3 (GOWN DISPOSABLE) ×1 IMPLANT
GOWN STRL REUS W/ TWL XL LVL3 (GOWN DISPOSABLE) ×1 IMPLANT
GOWN TOGA ZIPPER T7+ PEEL AWAY (MISCELLANEOUS) ×1 IMPLANT
HOLDER FOLEY CATH W/STRAP (MISCELLANEOUS) ×1 IMPLANT
HOOD PEEL AWAY T7 (MISCELLANEOUS) ×1 IMPLANT
KIT TURNOVER KIT A (KITS) ×1 IMPLANT
KNIFE SCULPS 14X20 (INSTRUMENTS) ×1 IMPLANT
MANIFOLD NEPTUNE II (INSTRUMENTS) ×2 IMPLANT
NDL SPNL 20GX3.5 QUINCKE YW (NEEDLE) ×2 IMPLANT
NEEDLE SPNL 20GX3.5 QUINCKE YW (NEEDLE) ×2 IMPLANT
PACK TOTAL KNEE (MISCELLANEOUS) ×1 IMPLANT
PAD ABD DERMACEA PRESS 5X9 (GAUZE/BANDAGES/DRESSINGS) ×2 IMPLANT
PAD ARMBOARD POSITIONER FOAM (MISCELLANEOUS) ×3 IMPLANT
PAD WRAPON POLAR KNEE (MISCELLANEOUS) ×1 IMPLANT
PATELLA MEDIAL ATTUN 35MM KNEE (Knees) IMPLANT
PENCIL SMOKE EVACUATOR COATED (MISCELLANEOUS) ×1 IMPLANT
PIN DRILL FIX HALF THREAD (BIT) ×2 IMPLANT
PIN FIXATION 1/8DIA X 3INL (PIN) ×1 IMPLANT
SOL .9 NS 3000ML IRR UROMATIC (IV SOLUTION) ×1 IMPLANT
SOLUTION IRRIG SURGIPHOR (IV SOLUTION) ×1 IMPLANT
SPONGE DRAIN TRACH 4X4 STRL 2S (GAUZE/BANDAGES/DRESSINGS) ×1 IMPLANT
STAPLER SKIN PROX 35W (STAPLE) ×1 IMPLANT
STOCKINETTE BIAS CUT 6 980064 (GAUZE/BANDAGES/DRESSINGS) IMPLANT
STOCKINETTE IMPERV 14X48 (MISCELLANEOUS) ×1 IMPLANT
STOCKINETTE STRL BIAS CUT 8X4 (MISCELLANEOUS) ×1 IMPLANT
STRAP TIBIA SHORT (MISCELLANEOUS) ×1 IMPLANT
SUCTION TUBE FRAZIER 10FR DISP (SUCTIONS) ×1 IMPLANT
SUT VIC AB 0 CT1 36 (SUTURE) ×1 IMPLANT
SUT VIC AB 1 CT1 36 (SUTURE) ×2 IMPLANT
SUT VIC AB 2-0 CT2 27 (SUTURE) ×1 IMPLANT
SYR 30ML LL (SYRINGE) ×2 IMPLANT
TIP FAN IRRIG PULSAVAC PLUS (DISPOSABLE) ×1 IMPLANT
TOWEL OR 17X26 4PK STRL BLUE (TOWEL DISPOSABLE) ×1 IMPLANT
TOWER CARTRIDGE SMART MIX (DISPOSABLE) ×1 IMPLANT
TRAP FLUID SMOKE EVACUATOR (MISCELLANEOUS) ×1 IMPLANT
TRAY FOLEY MTR SLVR 16FR STAT (SET/KITS/TRAYS/PACK) ×1 IMPLANT
WATER STERILE IRR 1000ML POUR (IV SOLUTION) ×1 IMPLANT

## 2023-06-28 NOTE — Progress Notes (Signed)
 Patient is not able to walk the distance required to go the bathroom, or he/she is unable to safely negotiate stairs required to access the bathroom.  A 3in1 BSC will alleviate this problem   Amenda Duclos P. Angie Fava M.D.

## 2023-06-28 NOTE — Anesthesia Procedure Notes (Signed)
 Spinal  Start time: 06/28/2023 11:38 AM End time: 06/28/2023 11:43 AM Staffing Performed: resident/CRNA  Anesthesiologist: Vanice Genre, MD Resident/CRNA: Izell Marsh, CRNA Performed by: Izell Marsh, CRNA Authorized by: Vanice Genre, MD   Preanesthetic Checklist Completed: patient identified, IV checked, site marked, risks and benefits discussed, surgical consent, monitors and equipment checked, pre-op evaluation and timeout performed Spinal Block Patient position: sitting Prep: DuraPrep Patient monitoring: heart rate, continuous pulse ox and blood pressure Approach: midline Location: L3-4 Injection technique: single-shot Needle Needle type: Whitacre and Pencan  Needle gauge: 24 G Needle length: 9 cm Assessment Events: paresthesia and CSF return Additional Notes Small parasthesia down left leg

## 2023-06-28 NOTE — Transfer of Care (Signed)
 Immediate Anesthesia Transfer of Care Note  Patient: Alexa Newman  Procedure(s) Performed: ARTHROPLASTY, KNEE, TOTAL, USING IMAGELESS COMPUTER-ASSISTED NAVIGATION (Left: Knee)  Patient Location: PACU  Anesthesia Type:Spinal  Level of Consciousness: awake, alert , and drowsy  Airway & Oxygen Therapy: Patient Spontanous Breathing  Post-op Assessment: Report given to RN and Post -op Vital signs reviewed and stable  Post vital signs: Reviewed and stable  Last Vitals:  Vitals Value Taken Time  BP 106/71 06/28/23 1509  Temp    Pulse 82 06/28/23 1509  Resp 17 06/28/23 1509  SpO2 96 % 06/28/23 1509    Last Pain:  Vitals:   06/28/23 0926  TempSrc: Temporal  PainSc: 0-No pain         Complications: There were no known notable events for this encounter.

## 2023-06-28 NOTE — Plan of Care (Signed)
   Problem: Coping: Goal: Level of anxiety will decrease Outcome: Progressing   Problem: Pain Managment: Goal: General experience of comfort will improve and/or be controlled Outcome: Progressing   Problem: Safety: Goal: Ability to remain free from injury will improve Outcome: Progressing

## 2023-06-28 NOTE — Op Note (Signed)
 OPERATIVE NOTE  DATE OF SURGERY:  06/28/2023  PATIENT NAME:  Alexa Newman   DOB: [birth year]  MRN: [id number]  PRE-OPERATIVE DIAGNOSIS: Degenerative arthrosis of the left knee, primary  POST-OPERATIVE DIAGNOSIS:  Same  PROCEDURE:  Left total knee arthroplasty using computer-assisted navigation  SURGEON:  Maxene Span. M.D.  ASSISTANT:  Benjiman Bras, PA-C (present and scrubbed throughout the case, critical for assistance with exposure, retraction, instrumentation, and closure)  ANESTHESIA: spinal  ESTIMATED BLOOD LOSS: 50 mL  FLUIDS REPLACED: 1000 mL of crystalloid  TOURNIQUET TIME: 90 minutes  DRAINS: 2 medium Hemovac drains  SOFT TISSUE RELEASES: Anterior cruciate ligament, posterior cruciate ligament, deep medial collateral ligament, patellofemoral ligament  IMPLANTS UTILIZED: DePuy Attune size 4 posterior stabilized femoral component (cemented), size 4 rotating platform tibial component (cemented), 35 mm medialized dome patella (cemented), and a 7 mm stabilized rotating platform polyethylene insert.  INDICATIONS FOR SURGERY: Alexa Newman is a 62 y.o. year old female with a long history of progressive knee pain. X-rays demonstrated severe degenerative changes in tricompartmental fashion. The patient had not seen any significant improvement despite conservative nonsurgical intervention. After discussion of the risks and benefits of surgical intervention, the patient expressed understanding of the risks benefits and agree with plans for total knee arthroplasty.   The risks, benefits, and alternatives were discussed at length including but not limited to the risks of infection, bleeding, nerve injury, stiffness, blood clots, the need for revision surgery, cardiopulmonary complications, among others, and they were willing to proceed.  PROCEDURE IN DETAIL: The patient was brought into the operating room and, after adequate spinal anesthesia was achieved, a tourniquet was  placed on the patient's upper thigh. The patient's knee and leg were cleaned and prepped with alcohol and DuraPrep and draped in the usual sterile fashion. A "timeout" was performed as per usual protocol. The lower extremity was exsanguinated using an Esmarch, and the tourniquet was inflated to 300 mmHg. An anterior longitudinal incision was made followed by a standard mid vastus approach. The deep fibers of the medial collateral ligament were elevated in a subperiosteal fashion off of the medial flare of the tibia so as to maintain a continuous soft tissue sleeve. The patella was subluxed laterally and the patellofemoral ligament was incised. Inspection of the knee demonstrated severe degenerative changes with full-thickness loss of articular cartilage. Osteophytes were debrided using a rongeur. Anterior and posterior cruciate ligaments were excised. Two 4.0 mm Schanz pins were inserted in the femur and into the tibia for attachment of the array of trackers used for computer-assisted navigation. Hip center was identified using a circumduction technique. Distal landmarks were mapped using the computer. The distal femur and proximal tibia were mapped using the computer. The distal femoral cutting guide was positioned using computer-assisted navigation so as to achieve a 5 distal valgus cut. The femur was sized and it was felt that a size 4 femoral component was appropriate. A size 4 femoral cutting guide was positioned and the anterior cut was performed and verified using the computer. This was followed by completion of the posterior and chamfer cuts. Femoral cutting guide for the central box was then positioned in the center box cut was performed.  Attention was then directed to the proximal tibia. Medial and lateral menisci were excised. The extramedullary tibial cutting guide was positioned using computer-assisted navigation so as to achieve a 0 varus-valgus alignment and 3 posterior slope. The cut was  performed and verified using the computer. The proximal tibia  was sized and it was felt that a size 4 tibial tray was appropriate. Tibial and femoral trials were inserted followed by insertion of a 5 mm polyethylene insert.  The knee was felt to be tight both in flexion and extension.  The trial components were removed and the extramedullary tibial cutting guide was repositioned so as to resect an additional 2 mm of bone.  Cut was performed verified using computer.  Trial implants were reinserted followed by placement of a 7 mm polyethylene trial.  This allowed for excellent mediolateral soft tissue balancing both in flexion and in full extension. Finally, the patella was cut and prepared so as to accommodate a 35 mm medialized dome patella. A patella trial was placed and the knee was placed through a range of motion with excellent patellar tracking appreciated. The femoral trial was removed after debridement of posterior osteophytes. The central post-hole for the tibial component was reamed followed by insertion of a keel punch. Tibial trials were then removed. Cut surfaces of bone were irrigated with copious amounts of normal saline using pulsatile lavage and then suctioned dry. Polymethylmethacrylate cement with gentamicin was prepared in the usual fashion using a vacuum mixer. Cement was applied to the cut surface of the proximal tibia as well as along the undersurface of a size 4 rotating platform tibial component. Tibial component was positioned and impacted into place. Excess cement was removed using Personal assistant. Cement was then applied to the cut surfaces of the femur as well as along the posterior flanges of the size 4 femoral component. The femoral component was positioned and impacted into place. Excess cement was removed using Personal assistant. A 7 mm polyethylene trial was inserted and the knee was brought into full extension with steady axial compression applied. Finally, cement was applied to the  backside of a 35 mm medialized dome patella and the patellar component was positioned and patellar clamp applied. Excess cement was removed using Personal assistant. After adequate curing of the cement, the tourniquet was deflated after a total tourniquet time of 90 minutes. Hemostasis was achieved using electrocautery. The knee was irrigated with copious amounts of normal saline using pulsatile lavage followed by 450 ml of Surgiphor and then suctioned dry. 20 mL of 1.3% Exparel  and 60 mL of 0.25% Marcaine  in 40 mL of normal saline was injected along the posterior capsule, medial and lateral gutters, and along the arthrotomy site. A 7 mm stabilized rotating platform polyethylene insert was inserted and the knee was placed through a range of motion with excellent mediolateral soft tissue balancing appreciated and excellent patellar tracking noted. 2 medium drains were placed in the wound bed and brought out through separate stab incisions. The medial parapatellar portion of the incision was reapproximated using interrupted sutures of #1 Vicryl. Subcutaneous tissue was approximated in layers using first #0 Vicryl followed #2-0 Vicryl. The skin was approximated with skin staples. A sterile dressing was applied.  The patient tolerated the procedure well and was transported to the recovery room in stable condition.    Alexa Newman, Jr., M.D.

## 2023-06-28 NOTE — Anesthesia Preprocedure Evaluation (Signed)
 Anesthesia Evaluation  Patient identified by MRN, date of birth, ID band Patient awake    Reviewed: Allergy & Precautions, H&P , NPO status , Patient's Chart, lab work & pertinent test results  History of Anesthesia Complications (+) PONV and history of anesthetic complications  Airway Mallampati: III  TM Distance: >3 FB Neck ROM: limited    Dental  (+) Chipped, Poor Dentition, Upper Dentures, Dental Advidsory Given   Pulmonary neg shortness of breath, COPD, neg recent URI, Current Smoker          Cardiovascular Exercise Tolerance: Good hypertension, (-) angina (-) Past MI and (-) DOE      Neuro/Psych  PSYCHIATRIC DISORDERS Anxiety Depression Bipolar Disorder   negative neurological ROS     GI/Hepatic negative GI ROS, Neg liver ROS,neg GERD  ,,  Endo/Other  negative endocrine ROS    Renal/GU negative Renal ROS  negative genitourinary   Musculoskeletal  (+) Arthritis ,    Abdominal   Peds  Hematology negative hematology ROS (+)   Anesthesia Other Findings Past Medical History: No date: Anxiety No date: Arthritis No date: Bipolar 1 disorder (HCC) No date: Depression No date: Osteoarthritis  Past Surgical History: No date: ANTERIOR CRUCIATE LIGAMENT (ACL) REVISION; Right 2013: DILATION AND CURETTAGE OF UTERUS     Comment:  for heavy bleeding 2013: ENDOMETRIAL ABLATION No date: TUBAL LIGATION 1990: WRIST ARTHROSCOPY; Left     Comment:  for degenerative changes     Reproductive/Obstetrics negative OB ROS                             Anesthesia Physical Anesthesia Plan  ASA: 3  Anesthesia Plan: Spinal   Post-op Pain Management:    Induction: Intravenous  PONV Risk Score and Plan: 3 and Propofol  infusion, TIVA and Treatment may vary due to age or medical condition  Airway Management Planned: Natural Airway and Simple Face Mask  Additional Equipment:   Intra-op Plan:    Post-operative Plan:   Informed Consent: I have reviewed the patients History and Physical, chart, labs and discussed the procedure including the risks, benefits and alternatives for the proposed anesthesia with the patient or authorized representative who has indicated his/her understanding and acceptance.     Dental Advisory Given  Plan Discussed with: Anesthesiologist, CRNA and Surgeon  Anesthesia Plan Comments: (Patient consented for risks of anesthesia including but not limited to:  - adverse reactions to medications - risk of intubation if required - damage to teeth, lips or other oral mucosa - sore throat or hoarseness - Damage to heart, brain, lungs or loss of life  Patient voiced understanding.)        Anesthesia Quick Evaluation

## 2023-06-28 NOTE — Progress Notes (Signed)
 PT Cancellation Note  Patient Details Name: ALISAN DOKES MRN: [id number] DOB: 02-Jul-1960   Cancelled Treatment:    Reason Eval/Treat Not Completed: Patient not medically ready.  PT consult received.  Chart reviewed.  Per discussion with pt's nurse, pt does not have adequate LE strength/sensation return s/p L TKA (with spinal anesthesia).  Will re-attempt PT evaluation tomorrow.  Amador Junes, PT 06/28/23, 4:40 PM

## 2023-06-28 NOTE — Interval H&P Note (Signed)
 History and Physical Interval Note:  06/28/2023 10:59 AM  Alexa Newman  has presented today for surgery, with the diagnosis of PRIMARY OSTEOARTHRITIS OF LEFT KNEE..  The various methods of treatment have been discussed with the patient and family. After consideration of risks, benefits and other options for treatment, the patient has consented to  Procedure(s): ARTHROPLASTY, KNEE, TOTAL, USING IMAGELESS COMPUTER-ASSISTED NAVIGATION (Left) as a surgical intervention.  The patient's history has been reviewed, patient examined, no change in status, stable for surgery.  I have reviewed the patient's chart and labs.  Questions were answered to the patient's satisfaction.     Dorisann Schwanke P Chelsae Zanella

## 2023-06-29 ENCOUNTER — Encounter: Payer: Self-pay | Admitting: Orthopedic Surgery

## 2023-06-29 DIAGNOSIS — M1712 Unilateral primary osteoarthritis, left knee: Secondary | ICD-10-CM | POA: Diagnosis not present

## 2023-06-29 MED ORDER — CELECOXIB 200 MG PO CAPS: 200.0000 mg | ORAL_CAPSULE | Freq: Two times a day (BID) | ORAL | 1 refills | Status: DC

## 2023-06-29 MED ORDER — TRAMADOL HCL 50 MG PO TABS
50.0000 mg | ORAL_TABLET | ORAL | 0 refills | Status: DC | PRN
Start: 2023-06-29 — End: 2023-12-16

## 2023-06-29 MED ORDER — ACETAMINOPHEN 10 MG/ML IV SOLN
INTRAVENOUS | Status: AC
  Filled 2023-06-29: qty 100

## 2023-06-29 MED ORDER — ORAL CARE MOUTH RINSE: 15.0000 mL | OROMUCOSAL | Status: DC | PRN

## 2023-06-29 MED ORDER — ASPIRIN 81 MG PO CHEW: 81.0000 mg | CHEWABLE_TABLET | Freq: Two times a day (BID) | ORAL | Status: AC

## 2023-06-29 MED ORDER — OXYCODONE HCL 5 MG PO TABS: 5.0000 mg | ORAL_TABLET | ORAL | 0 refills | Status: DC | PRN

## 2023-06-29 MED ORDER — OXYCODONE HCL 5 MG PO TABS
ORAL_TABLET | ORAL | Status: AC
Start: 2023-06-29 — End: ?
  Filled 2023-06-29: qty 2

## 2023-06-29 NOTE — Evaluation (Signed)
 Occupational Therapy Evaluation Patient Details Name: Alexa Newman MRN: [id number] DOB: [birth year] Today's Date: 06/29/2023   History of Present Illness   Alexa Newman is a 63 y.o. year old female with a long history of progressive knee pain. Patient is s/p L TKA.     Clinical Impressions Pt seen for OT evaluation this date, POD#1 from above surgery. Pt was independent in all ADLs and mobility prior to surgery. Pt is eager to return to PLOF with less pain and improved safety and independence. Pt currently requires supervision assist for LB dressing while in seated position due to pain and limited AROM of L knee. Performs bed mobility mod independent, and completes functional mobility t/f bathroom using BSC over standard commode with supervision. Good technique for transfers and is able to perform LB dressing routine with min vcs for sequencing to increase safety while donning underwear over hips. Pt instructed in polar care mgt, falls prevention strategies, home/routines modifications, DME/AE for LB bathing and dressing tasks, and compression stocking mgt. Pt verbalized understanding of education provided, OT handout given. Do not currently anticipate any OT needs following this hospitalization, no further acute OT needs at this time. Pt will discharge to daughter's home to have increased assist for ADLs and mobility, recommending a BSC, TTB and RW for at home use.      If plan is discharge home, recommend the following:   A little help with walking and/or transfers;A little help with bathing/dressing/bathroom;Help with stairs or ramp for entrance;Assist for transportation     Functional Status Assessment   Patient has had a recent decline in their functional status and demonstrates the ability to make significant improvements in function in a reasonable and predictable amount of time.     Equipment Recommendations   BSC/3in1      Precautions/Restrictions    Precautions Precautions: Knee Precaution Booklet Issued: Yes (comment) Recall of Precautions/Restrictions: Intact Restrictions Weight Bearing Restrictions Per Provider Order: Yes LLE Weight Bearing Per Provider Order: Weight bearing as tolerated     Mobility Bed Mobility Overal bed mobility: Modified Independent             General bed mobility comments: increased time    Transfers Overall transfer level: Needs assistance Equipment used: Rolling walker (2 wheels) Transfers: Sit to/from Stand, Bed to chair/wheelchair/BSC Sit to Stand: Supervision     Step pivot transfers: Supervision     General transfer comment: Pt able to stand from EOB, recliner with Supv with use of RW. Good stability, no overt LOB. Patient able to stand step transfer from bed > recliner with supervision. Mild lightheadedness. BP:  129/76      Balance Overall balance assessment: Needs assistance Sitting-balance support: Feet supported, No upper extremity supported Sitting balance-Leahy Scale: Normal     Standing balance support: Bilateral upper extremity supported, During functional activity Standing balance-Leahy Scale: Good Standing balance comment: use of RW for stability                           ADL either performed or assessed with clinical judgement   ADL Overall ADL's : Needs assistance/impaired     Grooming: Wash/dry hands;Wash/dry face;Sitting;Independent           Upper Body Dressing : Independent;Sitting Upper Body Dressing Details (indicate cue type and reason): doffs gown/dons dress over head Lower Body Dressing: Supervision/safety;With adaptive equipment;Sit to/from stand;Maximal assistance;Cueing for sequencing;Cueing for safety Lower Body Dressing Details (indicate cue type  and reason): min vcs for use of AE for LB dressing, able to don underwear, shoes and socks no physical assist, MAX A to don TED hose, edu on compensatory strategies. Toilet Transfer:  Supervision/safety;BSC/3in1;Rolling walker (2 wheels);Ambulation Toilet Transfer Details (indicate cue type and reason): t/f bathroom supervision using RW. Good technique for transfer. Toileting- Clothing Manipulation and Hygiene: Supervision/safety;Sit to/from stand       Functional mobility during ADLs: Supervision/safety;Rolling walker (2 wheels)       Vision Baseline Vision/History: 1 Wears glasses Ability to See in Adequate Light: 0 Adequate Patient Visual Report: No change from baseline Vision Assessment?: Wears glasses for reading            Pertinent Vitals/Pain Pain Assessment Pain Assessment: Faces Faces Pain Scale: Hurts a little bit Pain Location: L Knee Pain Descriptors / Indicators: Aching Pain Intervention(s): Limited activity within patient's tolerance     Extremity/Trunk Assessment Upper Extremity Assessment Upper Extremity Assessment: Overall WFL for tasks assessed   Lower Extremity Assessment Lower Extremity Assessment: Defer to PT evaluation LLE Deficits / Details: mild weakness noted in LLE, no formal MMT compelted s/p knee surgery LLE Sensation: WNL LLE Coordination: WNL   Cervical / Trunk Assessment Cervical / Trunk Assessment: Normal   Communication Communication Communication: No apparent difficulties   Cognition Arousal: Alert Behavior During Therapy: WFL for tasks assessed/performed Cognition: No apparent impairments                               Following commands: Intact       Cueing  General Comments   Cueing Techniques: Verbal cues  Pt with episode of sudden n/v, LPN provided meds during session   Exercises Other Exercises Other Exercises: Reviewed TKA OT handout        Home Living Family/patient expects to be discharged to:: Private residence Living Arrangements: Children (Going to [employer]) Available Help at Discharge: Family Type of Home: House Home Access: Stairs to enter Water quality scientist of Steps: 3 Entrance Stairs-Rails: None Home Layout: Multi-level;Able to live on main level with bedroom/bathroom Alternate Level Stairs-Number of Steps:  (can stay on main level of house.)   Bathroom Shower/Tub: Chief Strategy Officer: Standard Bathroom Accessibility: Yes   Home Equipment: None   Additional Comments: Plan to discharge to daughter's home at discharge, home environement information entered based on daughters home.      Prior Functioning/Environment Prior Level of Function : Independent/Modified Independent;Driving             Mobility Comments: IND with mobility, no AD use. ADLs Comments: IND with ADLs/IADLs    OT Problem List: Decreased strength;Decreased range of motion;Impaired balance (sitting and/or standing);Decreased knowledge of use of DME or AE;Decreased knowledge of precautions    AM-PAC OT "6 Clicks" Daily Activity     Outcome Measure Help from another person eating meals?: None Help from another person taking care of personal grooming?: None Help from another person toileting, which includes using toliet, bedpan, or urinal?: A Little Help from another person bathing (including washing, rinsing, drying)?: A Little Help from another person to put on and taking off regular upper body clothing?: None Help from another person to put on and taking off regular lower body clothing?: None 6 Click Score: 22   End of Session Equipment Utilized During Treatment: Rolling walker (2 wheels);Gait belt Nurse Communication: Mobility status;Other (comment) (episode of nausea and vomiting)  Activity Tolerance:  Patient tolerated treatment well Patient left: in bed;with call bell/phone within reach;with SCD's reapplied  OT Visit Diagnosis: Other abnormalities of gait and mobility (R26.89)                Time: 1610-9604 OT Time Calculation (min): 31 min Charges:  OT General Charges $OT Visit: 1 Visit OT Evaluation $OT Eval Low  Complexity: 1 Low OT Treatments $Self Care/Home Management : 8-22 mins  Alyaan Budzynski L. Avant Printy, OTR/L  06/29/23, 10:36 AM

## 2023-06-29 NOTE — Plan of Care (Signed)

## 2023-06-29 NOTE — Evaluation (Addendum)
 Physical Therapy Evaluation Patient Details Name: Alexa Newman MRN: [id number] DOB: 1960-12-03 Today's Date: 06/29/2023  History of Present Illness  CARDELIA SASSANO is a 63 y.o. year old female with a long history of progressive knee pain. Patient is s/p L TKA. At time of evaluation, patient is POD 1.  Clinical Impression  Patient received supine in bed, agreeable to PT evaluation. Patient was IND w/o AD, prior to admission. Patient will be living with daughter at discharge, multi level home with ability to live on main level. 3 STE without rails. Patient Mod I with bed mobility, BP stable with mobility despite lightheadedness. Reviewed precautions/HEP and positioning s/p TKA with patient. Patient able to verbalize and demo with good understanding noted. Patient able to complete STS and transfer with supervision with use of RW. Pt able to ambulate 170 ft with use of RW, CGA progressing to supervision. Pt also able to negotiate 4 steps without use of rails and HHA/CGA. PT providing extensive education on family support for entry/exit into home, options for negotiation, and use of RW. Patient BP stable, but did have nausea/vomiting with mobility. RN Notified. Patient left with all needs in reach. Patient will benefit from skilled PT services to address functional impairments (see below for additional) and maximize functional mobility. Anticipate the need for follow up PT services upon acute hospital discharge. Will continue to follow acutely while admitted.        If plan is discharge home, recommend the following: Assist for transportation;Help with stairs or ramp for entrance;A little help with walking and/or transfers   Can travel by private vehicle        Equipment Recommendations Rolling walker (2 wheels);BSC/3in1  Recommendations for Other Services       Functional Status Assessment Patient has had a recent decline in their functional status and demonstrates the ability to make  significant improvements in function in a reasonable and predictable amount of time.     Precautions / Restrictions Precautions Precautions: Knee Precaution Booklet Issued: Yes (comment) Recall of Precautions/Restrictions: Intact Restrictions LLE Weight Bearing Per Provider Order: Weight bearing as tolerated      Mobility  Bed Mobility Overal bed mobility: Modified Independent             General bed mobility comments: increased time, able to complete bed mobility Mod I.    Transfers Overall transfer level: Needs assistance Equipment used: Rolling walker (2 wheels) Transfers: Sit to/from Stand, Bed to chair/wheelchair/BSC Sit to Stand: Supervision   Step pivot transfers: Supervision       General transfer comment: Pt able to stand from EOB, recliner with Supv with use of RW. Good stability, no overt LOB. Patient able to stand step transfer from bed > recliner with supervision. Mild lightheadedness. BP:  129/76    Ambulation/Gait Ambulation/Gait assistance: Contact guard assist, Supervision Gait Distance (Feet): 170 Feet Assistive device: Rolling walker (2 wheels) Gait Pattern/deviations: Step-to pattern, Decreased step length - left, Decreased stance time - left, Decreased step length - right Gait velocity: Decreased     General Gait Details: Pt able to ambulate with RW x 170 ft initial CGA progressing to supervision, more limited due to nausea. Step to, antaglic gait pattern. Progressing to step through pattern with increased distance.  Stairs Stairs: Yes Stairs assistance: Contact guard assist Stair Management: No rails, Forwards Number of Stairs: 4 General stair comments: Pt able to complete stairs with no rails, HHA required. Educated on requiring family assist to allow for stair  negotiation. Patietn nauseous and vomitted after stairs/ambulation. RN notified. BP stable.  Wheelchair Mobility     Tilt Bed    Modified Rankin (Stroke Patients Only)        Balance Overall balance assessment: Needs assistance Sitting-balance support: Feet supported, No upper extremity supported Sitting balance-Leahy Scale: Normal     Standing balance support: Bilateral upper extremity supported, During functional activity Standing balance-Leahy Scale: Good Standing balance comment: use of RW for stability                             Pertinent Vitals/Pain Pain Assessment Pain Assessment: 0-10 Pain Score: 6  Pain Location: L Knee Pain Descriptors / Indicators: Aching Pain Intervention(s): Limited activity within patient's tolerance, Monitored during session    Home Living Family/patient expects to be discharged to:: Private residence Living Arrangements: Children (Going to [employer]) Available Help at Discharge: Family Type of Home: House Home Access: Stairs to enter Entrance Stairs-Rails: None Entrance Stairs-Number of Steps: 3 Alternate Level Stairs-Number of Steps:  (can stay on main level of house.) Home Layout: Multi-level;Able to live on main level with bedroom/bathroom Home Equipment: None Additional Comments: Plan to discharge to daughter's home at discharge, home environement information entered based on daughters home.    Prior Function Prior Level of Function : Independent/Modified Independent;Driving             Mobility Comments: IND with mobility, no AD use. ADLs Comments: IND with ADLs/IADLs     Extremity/Trunk Assessment   Upper Extremity Assessment Upper Extremity Assessment: Overall WFL for tasks assessed    Lower Extremity Assessment Lower Extremity Assessment: LLE deficits/detail LLE Deficits / Details: mild weakness noted in LLE, no formal MMT compelted s/p knee surgery LLE Sensation: WNL LLE Coordination: WNL    Cervical / Trunk Assessment Cervical / Trunk Assessment: Normal  Communication   Communication Communication: No apparent difficulties    Cognition Arousal: Alert Behavior  During Therapy: WFL for tasks assessed/performed   PT - Cognitive impairments: No apparent impairments                         Following commands: Intact       Cueing Cueing Techniques: Verbal cues     General Comments      Exercises Total Joint Exercises Goniometric ROM: -3 to 95 General Exercises - Lower Extremity Short Arc Quad: AROM, Strengthening, 10 reps, Supine Straight Leg Raises: Strengthening, AROM, Left, 10 reps, Supine Other Exercises Other Exercises: Reviewed TKA Handout, patient able to demo exercises with good form. Educated on positioning/elevation.   Assessment/Plan    PT Assessment Patient needs continued PT services  PT Problem List Decreased range of motion;Decreased strength;Decreased mobility;Decreased activity tolerance;Pain       PT Treatment Interventions DME instruction;Gait training;Stair training;Functional mobility training;Therapeutic activities;Therapeutic exercise;Balance training;Neuromuscular re-education    PT Goals (Current goals can be found in the Care Plan section)  Acute Rehab PT Goals Patient Stated Goal: Get Home PT Goal Formulation: With patient Time For Goal Achievement: 07/13/23 Potential to Achieve Goals: Good    Frequency BID     Co-evaluation               AM-PAC PT "6 Clicks" Mobility  Outcome Measure Help needed turning from your back to your side while in a flat bed without using bedrails?: None Help needed moving from lying on your back to sitting on the  side of a flat bed without using bedrails?: None Help needed moving to and from a bed to a chair (including a wheelchair)?: A Little Help needed standing up from a chair using your arms (e.g., wheelchair or bedside chair)?: A Little Help needed to walk in hospital room?: A Little Help needed climbing 3-5 steps with a railing? : A Little 6 Click Score: 20    End of Session Equipment Utilized During Treatment: Gait belt Activity Tolerance:  Patient tolerated treatment well Patient left: in chair;with call bell/phone within reach Nurse Communication: Mobility status;Other (comment) (Pt nausea/vomitting) PT Visit Diagnosis: Pain;Other abnormalities of gait and mobility (R26.89) Pain - Right/Left: Left Pain - part of body: Knee    Time: 1610-9604 PT Time Calculation (min) (ACUTE ONLY): 32 min   Charges:   PT Evaluation $PT Eval Low Complexity: 1 Low   PT General Charges $$ ACUTE PT VISIT: 1 Visit         Quillian Brunt Fairly, PT, DPT 06/29/23 10:13 AM

## 2023-06-29 NOTE — Progress Notes (Signed)
 Subjective: 1 Day Post-Op Procedure(s) (LRB): ARTHROPLASTY, KNEE, TOTAL, USING IMAGELESS COMPUTER-ASSISTED NAVIGATION (Left) Patient reports pain as mild.   Patient seen in rounds with Dr. Aubry Blase. Patient is well, and has had no acute complaints or problems. Denies any CP, SOB, N/V, fevers or chills We will start therapy today.  Plan is to go Home after hospital stay.  Objective: Vital signs in last 24 hours: Temp:  [97.3 F (36.3 C)-98 F (36.7 C)] 97.3 F (36.3 C) (04/22 0334) Pulse Rate:  [68-96] 80 (04/22 0334) Resp:  [15-23] 16 (04/22 0334) BP: (99-153)/(67-92) 108/71 (04/22 0334) SpO2:  [92 %-98 %] 94 % (04/22 0334) Weight:  [74.8 kg] 74.8 kg (04/21 0926)  Intake/Output from previous day:  Intake/Output Summary (Last 24 hours) at 06/29/2023 0755 Last data filed at 06/29/2023 0500 Gross per 24 hour  Intake 1700 ml  Output 810 ml  Net 890 ml    Intake/Output this shift: No intake/output data recorded.  Labs: No results for input(s): "HGB" in the last 72 hours. No results for input(s): "WBC", "RBC", "HCT", "PLT" in the last 72 hours. No results for input(s): "NA", "K", "CL", "CO2", "BUN", "CREATININE", "GLUCOSE", "CALCIUM " in the last 72 hours. No results for input(s): "LABPT", "INR" in the last 72 hours.  EXAM General - Patient is Alert, Appropriate, and Oriented Extremity - Neurologically intact Neurovascular intact Sensation intact distally Intact pulses distally Dorsiflexion/Plantar flexion intact No cellulitis present Compartment soft Dressing - dressing C/D/I and no drainage Motor Function - intact, moving foot and toes well on exam.  JP Drain pulled without difficulty. Intact  Past Medical History:  Diagnosis Date   Anxiety 1980's   Arthritis 2000   Bipolar 1 disorder (HCC)    Depression 1980's   Family history of adverse reaction to anesthesia    half sister claims that she would stop breathing during surgery x 2   Hypertension 2010    Osteoarthritis    PONV (postoperative nausea and vomiting)    Pre-diabetes     Assessment/Plan: 1 Day Post-Op Procedure(s) (LRB): ARTHROPLASTY, KNEE, TOTAL, USING IMAGELESS COMPUTER-ASSISTED NAVIGATION (Left) Principal Problem:   History of total knee arthroplasty, left  Estimated body mass index is 29.23 kg/m as calculated from the following:   Height as of this encounter: 5\' 3"  (1.6 m).   Weight as of this encounter: 74.8 kg. Advance diet Up with therapy  Patient will continue to work with physical therapy to pass postoperative PT protocols, ROM and strengthening  Discussed with the patient continuing to utilize Polar Care  Patient will use bone foam in 20-30 minute intervals  Patient will wear TED hose bilaterally to help prevent DVT and clot formation  Discussed the Aquacel bandage.  This bandage will stay in place 7 days postoperatively.  Can be replaced with honeycomb bandages that will be sent home with the patient  Discussed sending the patient home with tramadol  and oxycodone  for as needed pain management.  Patient will also be sent home with Celebrex  to help with swelling and inflammation.  Patient will take an 81 mg aspirin  twice daily for DVT prophylaxis  JP drain removed without difficulty, intact  Weight-Bearing as tolerated to left leg  Patient will follow-up with [facility] orth[facility]ics in 2 weeks for staple removal and reevaluation  Wadie Guile, PA-C [facility] 06/29/2023, 7:55 AM

## 2023-06-29 NOTE — Discharge Summary (Signed)
 Physician Discharge Summary  Subjective: 1 Day Post-Op Procedure(s) (LRB): ARTHROPLASTY, KNEE, TOTAL, USING IMAGELESS COMPUTER-ASSISTED NAVIGATION (Left) Patient reports pain as mild.   Patient seen in rounds with Dr. Aubry Blase. Patient is well, and has had no acute complaints or problems. Denies any CP, SOB, N/V, fevers or chills We will start therapy today.  Patient is ready to go home  Physician Discharge Summary  Patient ID: RILDA BULLS MRN: [id number] DOB/AGE: 06-07-1960 63 y.o.  Admit date: 06/28/2023 Discharge date: 06/29/2023  Admission Diagnoses:  Discharge Diagnoses:  Principal Problem:   History of total knee arthroplasty, left   Discharged Condition: good  Hospital Course: Patient presented to the hospital on 06/28/2023 for an elective left total knee arthroplasty performed by Dr. Aubry Blase. Patient was given 1g of TXA and 2g of Ancef  prior to the procedure. she tolerated the procedure well without any complications. See procedural note below for details. Postoperatively, the patient did very well. she was able to pass PT protocols on post-op day one without any issues. JP drain was removed without any difficulty and was intact. she was able to void her bladder without any difficulty. Physical exam was unremarkable. she denies any SOB, CP, N/V, fevers or chills. Vital signs are stable. Patient is stable to discharge home.  PROCEDURE:  Left total knee arthroplasty using computer-assisted navigation   SURGEON:  Maxene Span. M.D.   ASSISTANT:  Benjiman Bras, PA-C (present and scrubbed throughout the case, critical for assistance with exposure, retraction, instrumentation, and closure)   ANESTHESIA: spinal   ESTIMATED BLOOD LOSS: 50 mL   FLUIDS REPLACED: 1000 mL of crystalloid   TOURNIQUET TIME: 90 minutes   DRAINS: 2 medium Hemovac drains   SOFT TISSUE RELEASES: Anterior cruciate ligament, posterior cruciate ligament, deep medial collateral ligament,  patellofemoral ligament   IMPLANTS UTILIZED: DePuy Attune size 4 posterior stabilized femoral component (cemented), size 4 rotating platform tibial component (cemented), 35 mm medialized dome patella (cemented), and a 7 mm stabilized rotating platform polyethylene insert.  Treatments: none  Discharge Exam: Blood pressure 108/71, pulse 80, temperature (!) 97.3 F (36.3 C), temperature source Temporal, resp. rate 16, height 5\' 3"  (1.6 m), weight 74.8 kg, last menstrual period 12/07/2011, SpO2 94%.   Disposition: home   Allergies as of 06/29/2023       Reactions   Pregabalin Other (See Comments)   Suicidal ideation        Medication List     STOP taking these medications    ibuprofen  200 MG tablet Commonly known as: ADVIL        TAKE these medications    acetaminophen  650 MG CR tablet Commonly known as: TYLENOL  Take 1,300 mg by mouth every 8 (eight) hours as needed for pain.   amLODipine  10 MG tablet Commonly known as: NORVASC  Take 1 tablet (10 mg total) by mouth daily.   aspirin  81 MG chewable tablet Chew 1 tablet (81 mg total) by mouth 2 (two) times daily.   Calcium  600+D3 600-20 MG-MCG Tabs Generic drug: Calcium  Carb-Cholecalciferol Take 2 tablets by mouth daily.   celecoxib  200 MG capsule Commonly known as: CELEBREX  Take 1 capsule (200 mg total) by mouth 2 (two) times daily.   doxepin  10 MG capsule Commonly known as: SINEQUAN  TAKE 1 CAPSULE (10 MG TOTAL) BY MOUTH AT BEDTIME AS NEEDED. FOR SLEEP   hydrOXYzine  10 MG tablet Commonly known as: ATARAX  TAKE 1 TABLET BY MOUTH 3 TIMES DAILY AS NEEDED FOR ANXIETY.   lamoTRIgine  100 MG  tablet Commonly known as: LAMICTAL  TAKE 1 TABLET BY MOUTH TWICE A DAY   methocarbamol  500 MG tablet Commonly known as: ROBAXIN  Take 500 mg by mouth 2 (two) times daily.   nicotine  21 mg/24hr patch Commonly known as: NICODERM CQ  - dosed in mg/24 hours Place 1 patch (21 mg total) onto the skin daily.   oxyCODONE  5 MG  immediate release tablet Commonly known as: Oxy IR/ROXICODONE  Take 1 tablet (5 mg total) by mouth every 4 (four) hours as needed for moderate pain (pain score 4-6) (pain score 4-6).   rosuvastatin  5 MG tablet Commonly known as: CRESTOR  Take 1 tablet (5 mg total) by mouth every other day. TAKE 1 TABLET BY MOUTH EVERY OTHER DAY   Semaglutide -Weight Management 1 MG/0.5ML Soaj Inject 1 mg into the skin once a week.   traMADol  50 MG tablet Commonly known as: ULTRAM  Take 1-2 tablets (50-100 mg total) by mouth every 4 (four) hours as needed for moderate pain (pain score 4-6).               Durable Medical Equipment  (From admission, onward)           Start     Ordered   06/28/23 1550  DME Walker rolling  Once       Question:  Patient needs a walker to treat with the following condition  Answer:  Total knee replacement status   06/28/23 1549   06/28/23 1550  DME Bedside commode  Once       Comments: Patient is not able to walk the distance required to go the bathroom, or he/she is unable to safely negotiate stairs required to access the bathroom.  A 3in1 BSC will alleviate this problem  Question:  Patient needs a bedside commode to treat with the following condition  Answer:  Total knee replacement status   06/28/23 1549            Follow-up Information     Wadie Guile, PA-C Follow up on 07/13/2023.   Specialty: Orthopedic Surgery Why: at 9:45am Contact information: [address] [place] [address] [phone]         Arlyne Lame, MD Follow up on 08/10/2023.   Specialty: Orthopedic Surgery Why: at 2:30pm Contact information: [address] [facility] [place] [address] [phone]                 Signed: Benjiman Bras 06/29/2023, 7:57 AM   Objective: Vital signs in last 24 hours: Temp:  [97.3 F (36.3 C)-98 F (36.7 C)] 97.3 F (36.3 C) (04/22 0334) Pulse Rate:  [68-96] 80 (04/22 0334) Resp:  [15-23] 16  (04/22 0334) BP: (99-153)/(67-92) 108/71 (04/22 0334) SpO2:  [92 %-98 %] 94 % (04/22 0334) Weight:  [74.8 kg] 74.8 kg (04/21 0926)  Intake/Output from previous day:  Intake/Output Summary (Last 24 hours) at 06/29/2023 0757 Last data filed at 06/29/2023 0500 Gross per 24 hour  Intake 1700 ml  Output 810 ml  Net 890 ml    Intake/Output this shift: No intake/output data recorded.  Labs: No results for input(s): "HGB" in the last 72 hours. No results for input(s): "WBC", "RBC", "HCT", "PLT" in the last 72 hours. No results for input(s): "NA", "K", "CL", "CO2", "BUN", "CREATININE", "GLUCOSE", "CALCIUM " in the last 72 hours. No results for input(s): "LABPT", "INR" in the last 72 hours.  EXAM: General - Patient is Alert, Appropriate, and Oriented Extremity - Neurologically intact Neurovascular intact Sensation intact  distally Intact pulses distally Dorsiflexion/Plantar flexion intact No cellulitis present Compartment soft Dressing - dressing C/D/I and no drainage Motor Function - intact, moving foot and toes well on exam.  JP Drain pulled without difficulty. Intact  Assessment/Plan: 1 Day Post-Op Procedure(s) (LRB): ARTHROPLASTY, KNEE, TOTAL, USING IMAGELESS COMPUTER-ASSISTED NAVIGATION (Left) Procedure(s) (LRB): ARTHROPLASTY, KNEE, TOTAL, USING IMAGELESS COMPUTER-ASSISTED NAVIGATION (Left) Past Medical History:  Diagnosis Date   Anxiety 1980's   Arthritis 2000   Bipolar 1 disorder (HCC)    Depression 1980's   Family history of adverse reaction to anesthesia    half sister claims that she would stop breathing during surgery x 2   Hypertension 2010   Osteoarthritis    PONV (postoperative nausea and vomiting)    Pre-diabetes    Principal Problem:   History of total knee arthroplasty, left  Estimated body mass index is 29.23 kg/m as calculated from the following:   Height as of this encounter: 5\' 3"  (1.6 m).   Weight as of this encounter: 74.8 kg.  Patient will  continue to work with physical therapy   Discussed with the patient continuing to utilize Polar Care   Patient will use bone foam in 20-30 minute intervals   Patient will wear TED hose bilaterally to help prevent DVT and clot formation   Discussed the Aquacel bandage.  This bandage will stay in place 7 days postoperatively.  Can be replaced with honeycomb bandages that will be sent home with the patient   Discussed sending the patient home with tramadol  and oxycodone  for as needed pain management.  Patient will also be sent home with Celebrex  to help with swelling and inflammation.  Patient will take an 81 mg aspirin  twice daily for DVT prophylaxis   JP drain removed without difficulty, intact   Weight-Bearing as tolerated to left leg   Patient will follow-up with [facility] in 2 weeks for staple removal and reevaluation  Diet - Regular diet Follow up - in 2 weeks Activity - WBAT Disposition - Home Condition Upon Discharge - Good DVT Prophylaxis - Aspirin  and TED hose  Standley Earing, PA-C Orthopaedic Surgery 06/29/2023, 7:57 AM

## 2023-06-29 NOTE — TOC Progression Note (Signed)
 Transition of Care [facility]) - Progression Note    Patient Details  Name: Alexa Newman MRN: [id number] Date of Birth: 1961-01-15  Transition of Care [facility]) CM/SW Contact  Alexandra Ice, RN Phone Number: 06/29/2023, 8:29 AM  Clinical Narrative:    Received message from RN, that need BSC and RW. Sent message to Sam Creighton with [employer], requesting DME        Expected Discharge Plan and Services         Expected Discharge Date: 06/29/23                                     Social Determinants of Health (SDOH) Interventions SDOH Screenings   Food Insecurity: Food Insecurity Present (06/28/2023)  Housing: Low Risk  (06/28/2023)  Transportation Needs: No Transportation Needs (06/28/2023)  Utilities: Not At Risk (06/28/2023)  Alcohol Screen: Low Risk  (06/06/2023)  Depression (PHQ2-9): Low Risk  (02/16/2023)  Financial Resource Strain: Medium Risk (06/10/2023)   Received from [facility]  Physical Activity: Insufficiently Active (06/06/2023)  Social Connections: Socially Isolated (06/28/2023)  Stress: No Stress Concern Present (06/06/2023)  Tobacco Use: High Risk (06/28/2023)    Readmission Risk Interventions     No data to display

## 2023-06-29 NOTE — Progress Notes (Signed)
 DISCHARGE NOTE:  Pt dc with IV removed and pt education given. Pt received 2 honeycomb dressings and medication scripts. Pt educated on use of polar care and both TED hose on and in place. Pt wheeled down to medical mall entrance by staff and pt's daughter provided transportation.

## 2023-06-29 NOTE — TOC Transition Note (Signed)
 Transition of Care [facility]) - Discharge Note   Patient Details  Name: Alexa Newman MRN: [id number] Date of Birth: [birth year]  Transition of Care [facility]) CM/SW Contact:  Alexandra Ice, RN Phone Number: 06/29/2023, 10:16 AM   Clinical Narrative:    Patient to discharge today, home with home health. [facility] set up with surgeon's office prior to surgery. Spoke with Sam Creighton with [employer], patient received RW and BSC in 2022, not eligible for another one as has been less than 5 years, she will have to pay $75 per item through [employer] or she can go to another store to purchase. Patient stated she may have her walker in storage, but she donated the items because she did not think she would need them again. She declined to purchase them through [employer], stated she will go to [facility] to purchase the items. Notified Jon with [employer] to cancel referral. Sosa daughter will be providing transportation.    Final next level of care: Home w Home Health Services Barriers to Discharge: Barriers Resolved   Patient Goals and CMS Choice Patient states their goals for this hospitalization and ongoing recovery are:: she is going to daughter's house to help with her getting better          Discharge Placement                Patient to be transferred to facility by: Sosha   Patient and family notified of of transfer: 06/29/23  Discharge Plan and Services Additional resources added to the After Visit Summary for                            [facility] Arranged: PT, OT HH Agency: [facility] Date [facility] Agency Contacted: 06/29/23 Time HH Agency Contacted: 1015 Representative spoke with at [facility] Agency: [place]   Social Drivers of Health (SDOH) Interventions SDOH Screenings   Food Insecurity: Food Insecurity Present (06/28/2023)  Housing: Low Risk  (06/28/2023)  Transportation Needs: No Transportation Needs (06/28/2023)  Utilities: Not At Risk (06/28/2023)  Alcohol Screen: Low Risk  (06/06/2023)   Depression (PHQ2-9): Low Risk  (02/16/2023)  Financial Resource Strain: Medium Risk (06/10/2023)   Received from [facility]  Physical Activity: Insufficiently Active (06/06/2023)  Social Connections: Socially Isolated (06/28/2023)  Stress: No Stress Concern Present (06/06/2023)  Tobacco Use: High Risk (06/28/2023)     Readmission Risk Interventions     No data to display

## 2023-06-29 NOTE — Anesthesia Postprocedure Evaluation (Signed)
 Anesthesia Post Note  Patient: Alexa Newman  Procedure(s) Performed: ARTHROPLASTY, KNEE, TOTAL, USING IMAGELESS COMPUTER-ASSISTED NAVIGATION (Left: Knee)  Patient location during evaluation: Nursing Unit Anesthesia Type: Spinal Level of consciousness: awake Respiratory status: spontaneous breathing Cardiovascular status: stable Postop Assessment: no headache Anesthetic complications: no   There were no known notable events for this encounter.   Last Vitals:  Vitals:   06/29/23 0005 06/29/23 0334  BP: 99/67 108/71  Pulse: 81 80  Resp: 18 16  Temp:  (!) 36.3 C  SpO2: 94% 94%    Last Pain:  Vitals:   06/29/23 0548  TempSrc:   PainSc: 7                  Curvin Downing

## 2023-06-30 DIAGNOSIS — F419 Anxiety disorder, unspecified: Secondary | ICD-10-CM | POA: Diagnosis not present

## 2023-06-30 DIAGNOSIS — Z791 Long term (current) use of non-steroidal anti-inflammatories (NSAID): Secondary | ICD-10-CM | POA: Diagnosis not present

## 2023-06-30 DIAGNOSIS — F17210 Nicotine dependence, cigarettes, uncomplicated: Secondary | ICD-10-CM | POA: Diagnosis not present

## 2023-06-30 DIAGNOSIS — Z471 Aftercare following joint replacement surgery: Secondary | ICD-10-CM | POA: Diagnosis not present

## 2023-06-30 DIAGNOSIS — F319 Bipolar disorder, unspecified: Secondary | ICD-10-CM | POA: Diagnosis not present

## 2023-06-30 DIAGNOSIS — R7303 Prediabetes: Secondary | ICD-10-CM | POA: Diagnosis not present

## 2023-06-30 DIAGNOSIS — Z7982 Long term (current) use of aspirin: Secondary | ICD-10-CM | POA: Diagnosis not present

## 2023-06-30 DIAGNOSIS — Z96653 Presence of artificial knee joint, bilateral: Secondary | ICD-10-CM | POA: Diagnosis not present

## 2023-06-30 DIAGNOSIS — E785 Hyperlipidemia, unspecified: Secondary | ICD-10-CM | POA: Diagnosis not present

## 2023-06-30 DIAGNOSIS — I10 Essential (primary) hypertension: Secondary | ICD-10-CM | POA: Diagnosis not present

## 2023-06-30 DIAGNOSIS — Z7985 Long-term (current) use of injectable non-insulin antidiabetic drugs: Secondary | ICD-10-CM | POA: Diagnosis not present

## 2023-07-01 DIAGNOSIS — F17210 Nicotine dependence, cigarettes, uncomplicated: Secondary | ICD-10-CM | POA: Diagnosis not present

## 2023-07-01 DIAGNOSIS — F419 Anxiety disorder, unspecified: Secondary | ICD-10-CM | POA: Diagnosis not present

## 2023-07-01 DIAGNOSIS — E785 Hyperlipidemia, unspecified: Secondary | ICD-10-CM | POA: Diagnosis not present

## 2023-07-01 DIAGNOSIS — Z96653 Presence of artificial knee joint, bilateral: Secondary | ICD-10-CM | POA: Diagnosis not present

## 2023-07-01 DIAGNOSIS — F319 Bipolar disorder, unspecified: Secondary | ICD-10-CM | POA: Diagnosis not present

## 2023-07-01 DIAGNOSIS — Z7985 Long-term (current) use of injectable non-insulin antidiabetic drugs: Secondary | ICD-10-CM | POA: Diagnosis not present

## 2023-07-01 DIAGNOSIS — Z7982 Long term (current) use of aspirin: Secondary | ICD-10-CM | POA: Diagnosis not present

## 2023-07-01 DIAGNOSIS — Z791 Long term (current) use of non-steroidal anti-inflammatories (NSAID): Secondary | ICD-10-CM | POA: Diagnosis not present

## 2023-07-01 DIAGNOSIS — I10 Essential (primary) hypertension: Secondary | ICD-10-CM | POA: Diagnosis not present

## 2023-07-01 DIAGNOSIS — R7303 Prediabetes: Secondary | ICD-10-CM | POA: Diagnosis not present

## 2023-07-01 DIAGNOSIS — Z471 Aftercare following joint replacement surgery: Secondary | ICD-10-CM | POA: Diagnosis not present

## 2023-07-02 ENCOUNTER — Telehealth: Payer: Self-pay

## 2023-07-02 ENCOUNTER — Other Ambulatory Visit (HOSPITAL_COMMUNITY): Payer: Self-pay

## 2023-07-02 NOTE — Telephone Encounter (Signed)
 Pharmacy Patient Advocate Encounter   Received notification from Onbase that prior authorization for Wegovy  1MG /0.5ML auto-injectors is required/requested.   Insurance verification completed.   The patient is insured through [facility] .   Per test claim: PA required; PA submitted to above mentioned insurance via CoverMyMeds Key/confirmation #/EOC [id number] Status is pending

## 2023-07-02 NOTE — Telephone Encounter (Signed)
 Pharmacy Patient Advocate Encounter  Received notification from [facility] that Prior Authorization for Wegovy  1MG /0.5ML auto-injectors has been APPROVED from 07/02/23 to 07/01/24. Unable to obtain price due to refill too soon rejection, last fill date 06/14/23 next available fill date04/28/25   PA #/Case ID/Reference #: [id number]

## 2023-07-03 DIAGNOSIS — F319 Bipolar disorder, unspecified: Secondary | ICD-10-CM | POA: Diagnosis not present

## 2023-07-03 DIAGNOSIS — Z471 Aftercare following joint replacement surgery: Secondary | ICD-10-CM | POA: Diagnosis not present

## 2023-07-03 DIAGNOSIS — Z791 Long term (current) use of non-steroidal anti-inflammatories (NSAID): Secondary | ICD-10-CM | POA: Diagnosis not present

## 2023-07-03 DIAGNOSIS — I10 Essential (primary) hypertension: Secondary | ICD-10-CM | POA: Diagnosis not present

## 2023-07-03 DIAGNOSIS — F17210 Nicotine dependence, cigarettes, uncomplicated: Secondary | ICD-10-CM | POA: Diagnosis not present

## 2023-07-03 DIAGNOSIS — F419 Anxiety disorder, unspecified: Secondary | ICD-10-CM | POA: Diagnosis not present

## 2023-07-03 DIAGNOSIS — Z96653 Presence of artificial knee joint, bilateral: Secondary | ICD-10-CM | POA: Diagnosis not present

## 2023-07-03 DIAGNOSIS — Z7982 Long term (current) use of aspirin: Secondary | ICD-10-CM | POA: Diagnosis not present

## 2023-07-03 DIAGNOSIS — E785 Hyperlipidemia, unspecified: Secondary | ICD-10-CM | POA: Diagnosis not present

## 2023-07-03 DIAGNOSIS — R7303 Prediabetes: Secondary | ICD-10-CM | POA: Diagnosis not present

## 2023-07-03 DIAGNOSIS — Z7985 Long-term (current) use of injectable non-insulin antidiabetic drugs: Secondary | ICD-10-CM | POA: Diagnosis not present

## 2023-07-05 DIAGNOSIS — E785 Hyperlipidemia, unspecified: Secondary | ICD-10-CM | POA: Diagnosis not present

## 2023-07-05 DIAGNOSIS — Z7985 Long-term (current) use of injectable non-insulin antidiabetic drugs: Secondary | ICD-10-CM | POA: Diagnosis not present

## 2023-07-05 DIAGNOSIS — Z791 Long term (current) use of non-steroidal anti-inflammatories (NSAID): Secondary | ICD-10-CM | POA: Diagnosis not present

## 2023-07-05 DIAGNOSIS — R7303 Prediabetes: Secondary | ICD-10-CM | POA: Diagnosis not present

## 2023-07-05 DIAGNOSIS — F17210 Nicotine dependence, cigarettes, uncomplicated: Secondary | ICD-10-CM | POA: Diagnosis not present

## 2023-07-05 DIAGNOSIS — F419 Anxiety disorder, unspecified: Secondary | ICD-10-CM | POA: Diagnosis not present

## 2023-07-05 DIAGNOSIS — Z471 Aftercare following joint replacement surgery: Secondary | ICD-10-CM | POA: Diagnosis not present

## 2023-07-05 DIAGNOSIS — I10 Essential (primary) hypertension: Secondary | ICD-10-CM | POA: Diagnosis not present

## 2023-07-05 DIAGNOSIS — Z96653 Presence of artificial knee joint, bilateral: Secondary | ICD-10-CM | POA: Diagnosis not present

## 2023-07-05 DIAGNOSIS — F319 Bipolar disorder, unspecified: Secondary | ICD-10-CM | POA: Diagnosis not present

## 2023-07-05 DIAGNOSIS — Z7982 Long term (current) use of aspirin: Secondary | ICD-10-CM | POA: Diagnosis not present

## 2023-07-06 ENCOUNTER — Other Ambulatory Visit (HOSPITAL_COMMUNITY): Payer: Self-pay

## 2023-07-07 DIAGNOSIS — E785 Hyperlipidemia, unspecified: Secondary | ICD-10-CM | POA: Diagnosis not present

## 2023-07-07 DIAGNOSIS — Z7985 Long-term (current) use of injectable non-insulin antidiabetic drugs: Secondary | ICD-10-CM | POA: Diagnosis not present

## 2023-07-07 DIAGNOSIS — R7303 Prediabetes: Secondary | ICD-10-CM | POA: Diagnosis not present

## 2023-07-07 DIAGNOSIS — I10 Essential (primary) hypertension: Secondary | ICD-10-CM | POA: Diagnosis not present

## 2023-07-07 DIAGNOSIS — Z791 Long term (current) use of non-steroidal anti-inflammatories (NSAID): Secondary | ICD-10-CM | POA: Diagnosis not present

## 2023-07-07 DIAGNOSIS — F419 Anxiety disorder, unspecified: Secondary | ICD-10-CM | POA: Diagnosis not present

## 2023-07-07 DIAGNOSIS — F319 Bipolar disorder, unspecified: Secondary | ICD-10-CM | POA: Diagnosis not present

## 2023-07-07 DIAGNOSIS — Z471 Aftercare following joint replacement surgery: Secondary | ICD-10-CM | POA: Diagnosis not present

## 2023-07-07 DIAGNOSIS — F17210 Nicotine dependence, cigarettes, uncomplicated: Secondary | ICD-10-CM | POA: Diagnosis not present

## 2023-07-07 DIAGNOSIS — Z96653 Presence of artificial knee joint, bilateral: Secondary | ICD-10-CM | POA: Diagnosis not present

## 2023-07-07 DIAGNOSIS — Z7982 Long term (current) use of aspirin: Secondary | ICD-10-CM | POA: Diagnosis not present

## 2023-07-09 DIAGNOSIS — E785 Hyperlipidemia, unspecified: Secondary | ICD-10-CM | POA: Diagnosis not present

## 2023-07-09 DIAGNOSIS — R7303 Prediabetes: Secondary | ICD-10-CM | POA: Diagnosis not present

## 2023-07-09 DIAGNOSIS — Z96653 Presence of artificial knee joint, bilateral: Secondary | ICD-10-CM | POA: Diagnosis not present

## 2023-07-09 DIAGNOSIS — Z7985 Long-term (current) use of injectable non-insulin antidiabetic drugs: Secondary | ICD-10-CM | POA: Diagnosis not present

## 2023-07-09 DIAGNOSIS — I10 Essential (primary) hypertension: Secondary | ICD-10-CM | POA: Diagnosis not present

## 2023-07-09 DIAGNOSIS — F419 Anxiety disorder, unspecified: Secondary | ICD-10-CM | POA: Diagnosis not present

## 2023-07-09 DIAGNOSIS — Z791 Long term (current) use of non-steroidal anti-inflammatories (NSAID): Secondary | ICD-10-CM | POA: Diagnosis not present

## 2023-07-09 DIAGNOSIS — F17210 Nicotine dependence, cigarettes, uncomplicated: Secondary | ICD-10-CM | POA: Diagnosis not present

## 2023-07-09 DIAGNOSIS — Z471 Aftercare following joint replacement surgery: Secondary | ICD-10-CM | POA: Diagnosis not present

## 2023-07-09 DIAGNOSIS — F319 Bipolar disorder, unspecified: Secondary | ICD-10-CM | POA: Diagnosis not present

## 2023-07-09 DIAGNOSIS — Z7982 Long term (current) use of aspirin: Secondary | ICD-10-CM | POA: Diagnosis not present

## 2023-07-12 DIAGNOSIS — I10 Essential (primary) hypertension: Secondary | ICD-10-CM | POA: Diagnosis not present

## 2023-07-12 DIAGNOSIS — Z7985 Long-term (current) use of injectable non-insulin antidiabetic drugs: Secondary | ICD-10-CM | POA: Diagnosis not present

## 2023-07-12 DIAGNOSIS — Z7982 Long term (current) use of aspirin: Secondary | ICD-10-CM | POA: Diagnosis not present

## 2023-07-12 DIAGNOSIS — Z791 Long term (current) use of non-steroidal anti-inflammatories (NSAID): Secondary | ICD-10-CM | POA: Diagnosis not present

## 2023-07-12 DIAGNOSIS — F319 Bipolar disorder, unspecified: Secondary | ICD-10-CM | POA: Diagnosis not present

## 2023-07-12 DIAGNOSIS — E785 Hyperlipidemia, unspecified: Secondary | ICD-10-CM | POA: Diagnosis not present

## 2023-07-12 DIAGNOSIS — Z471 Aftercare following joint replacement surgery: Secondary | ICD-10-CM | POA: Diagnosis not present

## 2023-07-12 DIAGNOSIS — F17210 Nicotine dependence, cigarettes, uncomplicated: Secondary | ICD-10-CM | POA: Diagnosis not present

## 2023-07-12 DIAGNOSIS — F419 Anxiety disorder, unspecified: Secondary | ICD-10-CM | POA: Diagnosis not present

## 2023-07-12 DIAGNOSIS — Z96653 Presence of artificial knee joint, bilateral: Secondary | ICD-10-CM | POA: Diagnosis not present

## 2023-07-12 DIAGNOSIS — R7303 Prediabetes: Secondary | ICD-10-CM | POA: Diagnosis not present

## 2023-07-13 DIAGNOSIS — Z96652 Presence of left artificial knee joint: Secondary | ICD-10-CM | POA: Diagnosis not present

## 2023-07-14 DIAGNOSIS — Z471 Aftercare following joint replacement surgery: Secondary | ICD-10-CM | POA: Diagnosis not present

## 2023-07-19 DIAGNOSIS — Z96652 Presence of left artificial knee joint: Secondary | ICD-10-CM | POA: Diagnosis not present

## 2023-07-28 DIAGNOSIS — Z96652 Presence of left artificial knee joint: Secondary | ICD-10-CM | POA: Diagnosis not present

## 2023-08-10 DIAGNOSIS — Z96652 Presence of left artificial knee joint: Secondary | ICD-10-CM | POA: Diagnosis not present

## 2023-08-12 ENCOUNTER — Ambulatory Visit
Admission: RE | Admit: 2023-08-12 | Discharge: 2023-08-12 | Disposition: A | Discharge status: Home / Self Care | Source: Ambulatory Visit | Attending: Family Medicine | Admitting: Family Medicine

## 2023-08-12 DIAGNOSIS — Z1231 Encounter for screening mammogram for malignant neoplasm of breast: Secondary | ICD-10-CM | POA: Diagnosis not present

## 2023-08-17 ENCOUNTER — Ambulatory Visit (INDEPENDENT_AMBULATORY_CARE_PROVIDER_SITE_OTHER): Payer: Self-pay | Admitting: Psychiatry

## 2023-08-17 ENCOUNTER — Encounter: Payer: Self-pay | Admitting: Psychiatry

## 2023-08-17 VITALS — BP 128/84 | HR 105 | Temp 98.5°F | Ht 63.0 in | Wt 159.6 lb

## 2023-08-17 DIAGNOSIS — F3176 Bipolar disorder, in full remission, most recent episode depressed: Secondary | ICD-10-CM | POA: Diagnosis not present

## 2023-08-17 DIAGNOSIS — F5105 Insomnia due to other mental disorder: Secondary | ICD-10-CM | POA: Diagnosis not present

## 2023-08-17 DIAGNOSIS — F17200 Nicotine dependence, unspecified, uncomplicated: Secondary | ICD-10-CM

## 2023-08-17 DIAGNOSIS — F1421 Cocaine dependence, in remission: Secondary | ICD-10-CM

## 2023-08-17 DIAGNOSIS — F411 Generalized anxiety disorder: Secondary | ICD-10-CM

## 2023-08-17 DIAGNOSIS — Z8659 Personal history of other mental and behavioral disorders: Secondary | ICD-10-CM

## 2023-08-17 MED ORDER — DOXEPIN HCL 10 MG PO CAPS: 10.0000 mg | ORAL_CAPSULE | Freq: Every evening | ORAL | 1 refills | Status: AC | PRN

## 2023-08-17 NOTE — Patient Instructions (Signed)

## 2023-08-17 NOTE — Progress Notes (Signed)
 BH MD OP Progress Note  08/17/2023 2:43 PM Alexa Newman  MRN:  [id number]  Chief Complaint:  Chief Complaint  Patient presents with   Follow-up   Anxiety   Depression   Insomnia   Medication Refill   Discussed the use of AI scribe software for clinical note transcription with the patient, who gave verbal consent to proceed.  History of Present Illness Alexa Newman is a 63 year old Caucasian female, divorced, on SSI, lives in [place], has a history of bipolar disorder, borderline personality disorder, cocaine use disorder, GAD, insomnia, tobacco use disorder was evaluated in office today.  She has been experiencing significant sleep disturbances for the past week, characterized by an inability to sleep for more than an hour at a time. Typically, she sleeps about four hours a night, but this has worsened recently. No recent stress or changes in her routine could account for this change.  She used to take doxepin  as needed which helped with sleep.  She however has ran out.  It did help previously with sleep.  Approximately six weeks ago, she underwent knee surgery and initially experienced increased sleepiness during the first week post-surgery. Her leg is sensitive at night, particularly when her legs touch, which may contribute to her sleep issues. She has been using tramadol  for pain management, taking it approximately every two days, but not daily.   She is currently compliant on her mood stabilizer, lamotrigine .  Denies side effects.  She recently increased her dose of Wegovy , which she resumed after a break for surgery. She denies experiencing depression, anxiety, or suicidal thoughts. She feels she has more energy and is able to engage in more activities, such as walking and potentially swimming.  She has a history of back pain, which has improved with weight loss. She is able to walk up and down stairs more easily and is considering swimming as an exercise option.   She  continues to smoke and has attempted to quit, particularly during her recovery at her daughter's house, but has not been successful. She is frustrated with her smoking habit and the difficulty of quitting.    Visit Diagnosis:    ICD-10-CM   1. Bipolar disorder, in full remission, most recent episode depressed (HCC)  F31.76     2. GAD (generalized anxiety disorder)  F41.1     3. Insomnia due to mental condition  F51.05 doxepin  (SINEQUAN ) 10 MG capsule   pain    4. Tobacco use disorder  F17.200     5. Hx of borderline personality disorder  Z86.59     6. Cocaine use disorder, moderate, in sustained remission (HCC)  F14.21       Past Psychiatric History: I have reviewed past psychiatric history from progress note on 07/27/2017.  Past Medical History:  Past Medical History:  Diagnosis Date   Anxiety 1980's   Arthritis 2000   Bipolar 1 disorder (HCC)    Depression 1980's   Family history of adverse reaction to anesthesia    half sister claims that she would stop breathing during surgery x 2   Hypertension 2010   Osteoarthritis    PONV (postoperative nausea and vomiting)    Pre-diabetes     Past Surgical History:  Procedure Laterality Date   ANTERIOR CRUCIATE LIGAMENT (ACL) REVISION Right    CARPAL TUNNEL RELEASE     COLONOSCOPY     COLONOSCOPY WITH PROPOFOL  N/A 11/17/2017   Procedure: COLONOSCOPY WITH PROPOFOL ;  Surgeon: Arvella Bird  B, MD;  Location: [facility] ENDOSCOPY;  Service: Endoscopy;  Laterality: N/A;   COLONOSCOPY WITH PROPOFOL  N/A 11/24/2022   Procedure: COLONOSCOPY WITH PROPOFOL ;  Surgeon: Luke Salaam, MD;  Location[facility] ENDOSCOPY;  Service: Gastroenterology;  Laterality: N/A;   COLONOSCOPY WITH PROPOFOL  N/A 01/25/2023   Procedure: COLONOSCOPY WITH PROPOFOL ;  Surgeon: Luke Salaam, MD;  Location: [facility] ENDOSCOPY;  Service: Gastroenterology;  Laterality: N/A;   DILATION AND CURETTAGE OF UTERUS  2013   for heavy bleeding   ENDOMETRIAL ABLATION  2013   JOINT  REPLACEMENT  2021   Rt knee replacement   KNEE ARTHROPLASTY Right 10/02/2020   Procedure: COMPUTER ASSISTED TOTAL KNEE ARTHROPLASTY;  Surgeon: Arlyne Lame, MD;  Location: [facility] ORS;  Service: Orthopedics;  Laterality: Right;   KNEE ARTHROPLASTY Left 06/28/2023   Procedure: ARTHROPLASTY, KNEE, TOTAL, USING IMAGELESS COMPUTER-ASSISTED NAVIGATION;  Surgeon: Arlyne Lame, MD;  Location: [facility] ORS;  Service: Orthopedics;  Laterality: Left;   POLYPECTOMY  01/25/2023   Procedure: POLYPECTOMY;  Surgeon: Luke Salaam, MD;  Location: [facility] ENDOSCOPY;  Service: Gastroenterology;;   right wrist surgery     TUBAL LIGATION  1990   WRIST ARTHROSCOPY Left 1990   for degenerative changes    Family Psychiatric History: I have reviewed family psychiatric history from progress note on 07/27/2017.  Family History:  Family History  Problem Relation Age of Onset   Diabetes Mother    Depression Mother    Hypertension Mother    Crohn's disease Mother    Lung cancer Mother 62       former smoker   Congestive Heart Failure Father    Alcohol abuse Father    Breast cancer Sister 77   Alcohol abuse Sister    Drug abuse Sister    Anxiety disorder Sister    Depression Sister    Bipolar disorder Sister    Hypertension Sister    Alcohol abuse Brother    Drug abuse Brother    Anxiety disorder Brother    Depression Brother    Colon polyps Brother    HIV Brother    Hypertension Brother    Alcohol abuse Brother    Drug abuse Brother    Anxiety disorder Brother    Depression Brother    Colon polyps Brother    HIV Brother    Hypertension Brother    Skin cancer Maternal Grandfather    Colon cancer Neg Hx    Ovarian cancer Neg Hx    Cervical cancer Neg Hx     Social History: I have reviewed social history from progress note on 07/27/2017. Social History   Socioeconomic History   Marital status: Divorced    Spouse name: Not on file   Number of children: 1   Years of education: Not on file    Highest education level: Some college, no degree  Occupational History   Occupation: disabled    Comment: on SSI awaiting decision about disability  Tobacco Use   Smoking status: Every Day    Current packs/day: 0.50    Average packs/day: 0.5 packs/day for 50.0 years (25.0 ttl pk-yrs)    Types: Cigarettes   Smokeless tobacco: Never   Tobacco comments:    Currently smokes 1 cig every few days  Vaping Use   Vaping status: Former  Substance and Sexual Activity   Alcohol use: Not Currently    Comment: 1 times per month   Drug use: Not Currently    Comment: former crack cocaine user  Sexual activity: Not Currently    Partners: Male    Birth control/protection: Post-menopausal, Surgical  Other Topics Concern   Not on file  Social History Narrative   Live with brother and his husband   Social Drivers of Corporate investment banker Strain: Low Risk  (07/14/2023)   Received from [facility]   Overall Financial Resource Strain (CARDIA)    Difficulty of Paying Living Expenses: Not hard at all  Recent Concern: Financial Resource Strain - Medium Risk (06/10/2023)   Received from [facility]   Overall Financial Resource Strain (CARDIA)    Difficulty of Paying Living Expenses: Somewhat hard  Food Insecurity: No Food Insecurity (07/14/2023)   Received from [facility]   Hunger Vital Sign    Worried About Running Out of Food in the Last Year: Never true    Ran Out of Food in the Last Year: Never true  Recent Concern: Food Insecurity - Food Insecurity Present (06/28/2023)   Hunger Vital Sign    Worried About Running Out of Food in the Last Year: Sometimes true    Ran Out of Food in the Last Year: Sometimes true  Transportation Needs: No Transportation Needs (07/14/2023)   Received from [facility] - Transportation    In the past 12 months, has lack of transportation kept you from medical appointments or from  getting medications?: No    Lack of Transportation (Non-Medical): No  Physical Activity: Insufficiently Active (06/06/2023)   Exercise Vital Sign    Days of Exercise per Week: 2 days    Minutes of Exercise per Session: 20 min  Stress: No Stress Concern Present (06/06/2023)   [employer] of Occupational Health - Occupational Stress Questionnaire    Feeling of Stress : Not at all  Social Connections: Socially Isolated (06/28/2023)   Social Connection and Isolation Panel [NHANES]    Frequency of Communication with Friends and Family: More than three times a week    Frequency of Social Gatherings with Friends and Family: More than three times a week    Attends Religious Services: Never    Database administrator or Organizations: No    Attends Banker Meetings: Never    Marital Status: Divorced    Allergies:  Allergies  Allergen Reactions   Pregabalin Other (See Comments)    Suicidal ideation    Metabolic Disorder Labs: Lab Results  Component Value Date   HGBA1C 5.8 (H) 04/08/2023   No results found for: "PROLACTIN" Lab Results  Component Value Date   CHOL 157 04/08/2023   TRIG 125 04/08/2023   HDL 62 04/08/2023   CHOLHDL 2.6 12/02/2020   LDLCALC 73 04/08/2023   LDLCALC 71 10/05/2022   Lab Results  Component Value Date   TSH 0.661 10/05/2022   TSH 0.801 12/02/2020    Therapeutic Level Labs: No results found for: "LITHIUM" No results found for: "VALPROATE" No results found for: "CBMZ"  Current Medications: Current Outpatient Medications  Medication Sig Dispense Refill   acetaminophen  (TYLENOL ) 650 MG CR tablet Take 1,300 mg by mouth every 8 (eight) hours as needed for pain.     amLODipine  (NORVASC ) 10 MG tablet Take 1 tablet (10 mg total) by mouth daily. 90 tablet 1   aspirin  81 MG chewable tablet Chew 1 tablet (81 mg total) by mouth 2 (two) times daily.     Calcium  Carb-Cholecalciferol (CALCIUM  600+D3) 600-20 MG-MCG TABS Take 2 tablets  by mouth  daily.     celecoxib  (CELEBREX ) 200 MG capsule Take 1 capsule (200 mg total) by mouth 2 (two) times daily. 60 capsule 1   doxepin  (SINEQUAN ) 10 MG capsule Take 1 capsule (10 mg total) by mouth at bedtime as needed. For sleep 30 capsule 1   lamoTRIgine  (LAMICTAL ) 100 MG tablet TAKE 1 TABLET BY MOUTH TWICE A DAY 180 tablet 1   methocarbamol  (ROBAXIN ) 500 MG tablet Take 500 mg by mouth 2 (two) times daily.     nicotine  (NICODERM CQ  - DOSED IN MG/24 HOURS) 21 mg/24hr patch Place 1 patch (21 mg total) onto the skin daily. 28 patch 3   rosuvastatin  (CRESTOR ) 5 MG tablet Take 1 tablet (5 mg total) by mouth every other day. TAKE 1 TABLET BY MOUTH EVERY OTHER DAY 45 tablet 3   Semaglutide -Weight Management 1 MG/0.5ML SOAJ Inject 1 mg into the skin once a week. 6 mL 1   traMADol  (ULTRAM ) 50 MG tablet Take 1-2 tablets (50-100 mg total) by mouth every 4 (four) hours as needed for moderate pain (pain score 4-6). 30 tablet 0   No current facility-administered medications for this visit.     Musculoskeletal: Strength & Muscle Tone: within normal limits Gait & Station: normal Patient leans: N/A  Psychiatric Specialty Exam: Review of Systems  Psychiatric/Behavioral:  Positive for decreased concentration and sleep disturbance. The patient is nervous/anxious.     Blood pressure 128/84, pulse (!) 105, temperature 98.5 F (36.9 C), temperature source Temporal, height 5\' 3"  (1.6 m), weight 159 lb 9.6 oz (72.4 kg), last menstrual period 12/07/2011, SpO2 98%.Body mass index is 28.27 kg/m.  General Appearance: Casual  Eye Contact:  Fair  Speech:  Clear and Coherent  Volume:  Normal  Mood:  Anxious  Affect:  Congruent  Thought Process:  Goal Directed and Descriptions of Associations: Intact  Orientation:  Full (Time, Place, and Person)  Thought Content: Logical   Suicidal Thoughts:  No  Homicidal Thoughts:  No  Memory:  Immediate;   Fair Recent;   Fair Remote;   Fair  Judgement:  Fair  Insight:  Fair   Psychomotor Activity:  Normal  Concentration:  Concentration: Poor and Attention Span: Poor  Recall:  [employer] of Knowledge: Fair  Language: Fair  Akathisia:  No  Handed:  Right  AIMS (if indicated): done denies any abnormal involuntary movements.  Assets:  Manufacturing systems engineer Desire for Improvement Housing Social Support Transportation  ADL's:  Intact  Cognition: WNL  Sleep:  Poor   Screenings: AIMS    Flowsheet Row Video Visit from 10/16/2021 in [facility] Video Visit from 07/17/2021 in [facility]  AIMS Total Score 0 0      GAD-7    Flowsheet Row Office Visit from 08/17/2023 in [facility] Office Visit from 02/16/2023 in [facility] Office Visit from 10/01/2022 in [facility] Video Visit from 10/16/2021 in [facility] Video Visit from 07/17/2021 in [facility]  Total GAD-7 Score 1 3 7 13 2       PHQ2-9    Flowsheet Row Office Visit from 08/17/2023 in [facility] Office Visit from 02/16/2023 in [facility] Office Visit from 12/21/2022 in [facility] Office Visit from 10/05/2022 in [facility]  Visit from 10/01/2022 in [facility]  PHQ-2 Total Score 0 0 1 3 0  PHQ-9 Total Score -- -- -- 8 6      Flowsheet Row Office Visit from 08/17/2023 in [facility] Admission (Discharged) from 06/28/2023 in [facility] Office Visit from 02/16/2023 in [facility]  C-SSRS RISK CATEGORY Moderate Risk Error: Q7 should not be populated when  Q6 is No Moderate Risk        Assessment and Plan: Alexa Newman is a 63 year old Caucasian female, divorced, on SSI, lives in [place], has a history of bipolar disorder, borderline personality disorder, insomnia was evaluated in office today.  Discussed assessment and plan as noted below.  Bipolar disorder in remission Currently mood symptoms are well managed on the current medication regimen.  Compliant on the Lamictal . Continue Lamictal  100 mg twice daily  Generalized anxiety disorder-stable Currently denies any significant anxiety although she is anxious about her not being able to sleep. Continue Hydroxyzine  10 mg as needed for severe anxiety symptoms.  Insomnia-unstable Sleep problems since the past week or so.  Does have pain, status post left knee surgery , which likely also contributing to pain.  Does have tramadol  and Tylenol  available however she does not use anything at bedtime for pain management. Restart Doxepin  10 mg at bedtime as needed for sleep Discussed drug to drug interaction between Doxepin  and Tramadol .  Patient to be cautious. Will need sufficient pain management.  Tobacco use disorder-unstable Currently continues to smoke cigarettes.  Interested in quitting however does not know whether she wants to start doing it at this time.  Receptive to counseling. Provided counseling for 1 minute. Provider smoking cessation resources.   Follow-up Follow-up in clinic in 3 weeks or sooner if needed.  Consent: Patient/Guardian gives verbal consent for treatment and assignment of benefits for services provided during this visit. Patient/Guardian expressed understanding and agreed to proceed.   This note was generated in part or whole with voice recognition software. Voice recognition is usually quite accurate but there are transcription errors that can and very often do occur. I apologize for any typographical errors that were not detected and  corrected.    Lovene Maret, MD 08/17/2023, 2:43 PM

## 2023-08-18 ENCOUNTER — Ambulatory Visit: Payer: Self-pay | Admitting: Family Medicine

## 2023-09-02 ENCOUNTER — Encounter: Payer: Self-pay | Admitting: Psychiatry

## 2023-09-02 ENCOUNTER — Telehealth: Payer: Self-pay | Admitting: Psychiatry

## 2023-09-02 DIAGNOSIS — Z8659 Personal history of other mental and behavioral disorders: Secondary | ICD-10-CM | POA: Diagnosis not present

## 2023-09-02 DIAGNOSIS — F17210 Nicotine dependence, cigarettes, uncomplicated: Secondary | ICD-10-CM

## 2023-09-02 DIAGNOSIS — F17200 Nicotine dependence, unspecified, uncomplicated: Secondary | ICD-10-CM

## 2023-09-02 DIAGNOSIS — F5105 Insomnia due to other mental disorder: Secondary | ICD-10-CM | POA: Diagnosis not present

## 2023-09-02 DIAGNOSIS — F1421 Cocaine dependence, in remission: Secondary | ICD-10-CM

## 2023-09-02 DIAGNOSIS — F411 Generalized anxiety disorder: Secondary | ICD-10-CM | POA: Diagnosis not present

## 2023-09-02 DIAGNOSIS — F3176 Bipolar disorder, in full remission, most recent episode depressed: Secondary | ICD-10-CM

## 2023-09-02 NOTE — Progress Notes (Signed)
 Virtual Visit via Video Note  I connected with Alexa Newman on 09/02/23 at  1:40 PM EDT by a video enabled telemedicine application and verified that I am speaking with the correct person using two identifiers.  Location Provider Location : [facility] Patient Location : Home  Participants: Patient , Provider    I discussed the limitations of evaluation and management by telemedicine and the availability of in person appointments. The patient expressed understanding and agreed to proceed.   I discussed the assessment and treatment plan with the patient. The patient was provided an opportunity to ask questions and all were answered. The patient agreed with the plan and demonstrated an understanding of the instructions.   The patient was advised to call back or seek an in-person evaluation if the symptoms worsen or if the condition fails to improve as anticipated.   BH MD OP Progress Note  09/02/2023 4:49 PM Alexa Newman  MRN:  [id number]  Chief Complaint:  Chief Complaint  Patient presents with   Follow-up   Anxiety   Depression   Medication Refill   Discussed the use of AI scribe software for clinical note transcription with the patient, who gave verbal consent to proceed.  History of Present Illness Alexa Newman is a 63 year old Caucasian female, divorced, on SSI, lives in [place], has a history of bipolar disorder, borderline personality disorder, cocaine use disorder, GAD, insomnia, tobacco use disorder was evaluated by telemedicine today.  She has been experiencing ongoing sleep disturbances and was prescribed doxepin  to aid with sleep, which she started taking around June 10th. Initially, she took the medication for three consecutive nights and experienced improved sleep, achieving six to seven hours of rest, which she described as 'incredible' compared to her previous sleep patterns. However, she notes that taking doxepin  makes her feel 'loopy' and 'dragging' the next  day, particularly when taken at 9 PM the previous night. She woke up at 6 AM, went back to sleep, and then got up around 7 AM, feeling excessively tired and eager to return to bed.  Her leg was previously sensitive to touch at night, causing pain, but this symptom has improved significantly. She also reports occasional anxiety, particularly related to upcoming family visits, which may contribute to her sleep issues.   She is currently on Wegovy , with a recent dose increase. She takes methocarbamol  daily, both in the morning and evening, and has been on it for years. She has not been taking tramadol  recently, as she has not needed it. She rarely consumes alcohol, noting a past history of heavy drinking but currently only drinks occasionally and in small amounts.  No significant anxiety, depression.  She denies any suicidality, homicidality or perceptual disturbances.  She reports improved mental health overall, stating she is 'in a good place'. She also mentions difficulty with attention and focus, particularly with reading, but notes this has improved with better sleep.   Visit Diagnosis:    ICD-10-CM   1. Bipolar disorder, in full remission, most recent episode depressed (HCC)  F31.76     2. GAD (generalized anxiety disorder)  F41.1     3. Insomnia due to mental condition  F51.05    Pain    4. Tobacco use disorder  F17.200     5. Hx of borderline personality disorder  Z86.59     6. Cocaine use disorder, moderate, in sustained remission (HCC)  F14.21       Past Psychiatric History: I have reviewed  past psychiatric history from progress note on 07/27/2017.  Past Medical History:  Past Medical History:  Diagnosis Date   Anxiety 1980's   Arthritis 2000   Bipolar 1 disorder (HCC)    Depression 1980's   Family history of adverse reaction to anesthesia    half sister claims that she would stop breathing during surgery x 2   Hypertension 2010   Osteoarthritis    PONV (postoperative  nausea and vomiting)    Pre-diabetes     Past Surgical History:  Procedure Laterality Date   ANTERIOR CRUCIATE LIGAMENT (ACL) REVISION Right    CARPAL TUNNEL RELEASE     COLONOSCOPY     COLONOSCOPY WITH PROPOFOL  N/A 11/17/2017   Procedure: COLONOSCOPY WITH PROPOFOL ;  Surgeon: Janalyn Keene NOVAK, MD;  Location: [facility] ENDOSCOPY;  Service: Endoscopy;  Laterality: N/A;   COLONOSCOPY WITH PROPOFOL  N/A 11/24/2022   Procedure: COLONOSCOPY WITH PROPOFOL ;  Surgeon: Therisa Bi, MD;  Location: [facility] ENDOSCOPY;  Service: Gastroenterology;  Laterality: N/A;   COLONOSCOPY WITH PROPOFOL  N/A 01/25/2023   Procedure: COLONOSCOPY WITH PROPOFOL ;  Surgeon: Therisa Bi, MD;  Location: [facility] ENDOSCOPY;  Service: Gastroenterology;  Laterality: N/A;   DILATION AND CURETTAGE OF UTERUS  2013   for heavy bleeding   ENDOMETRIAL ABLATION  2013   JOINT REPLACEMENT  2021   Rt knee replacement   KNEE ARTHROPLASTY Right 10/02/2020   Procedure: COMPUTER ASSISTED TOTAL KNEE ARTHROPLASTY;  Surgeon: Mardee Lynwood SQUIBB, MD;  Location: [facility] ORS;  Service: Orthopedics;  Laterality: Right;   KNEE ARTHROPLASTY Left 06/28/2023   Procedure: ARTHROPLASTY, KNEE, TOTAL, USING IMAGELESS COMPUTER-ASSISTED NAVIGATION;  Surgeon: Mardee Lynwood SQUIBB, MD;  Location: [facility] ORS;  Service: Orthopedics;  Laterality: Left;   POLYPECTOMY  01/25/2023   Procedure: POLYPECTOMY;  Surgeon: Therisa Bi, MD;  Location: [facility] ENDOSCOPY;  Service: Gastroenterology;;   right wrist surgery     TUBAL LIGATION  1990   WRIST ARTHROSCOPY Left 1990   for degenerative changes    Family Psychiatric History: I have reviewed family psychiatric history from progress note on 07/27/2017.  Family History:  Family History  Problem Relation Age of Onset   Diabetes Mother    Depression Mother    Hypertension Mother    Crohn's disease Mother    Lung cancer Mother 38       former smoker   Congestive Heart Failure Father    Alcohol abuse Father    Breast cancer Sister 41    Alcohol abuse Sister    Drug abuse Sister    Anxiety disorder Sister    Depression Sister    Bipolar disorder Sister    Hypertension Sister    Alcohol abuse Brother    Drug abuse Brother    Anxiety disorder Brother    Depression Brother    Colon polyps Brother    HIV Brother    Hypertension Brother    Alcohol abuse Brother    Drug abuse Brother    Anxiety disorder Brother    Depression Brother    Colon polyps Brother    HIV Brother    Hypertension Brother    Skin cancer Maternal Grandfather    Colon cancer Neg Hx    Ovarian cancer Neg Hx    Cervical cancer Neg Hx     Social History: I have reviewed social history from progress note on 07/27/2017. Social History   Socioeconomic History   Marital status: Divorced    Spouse name: Not on file   Number of children:  1   Years of education: Not on file   Highest education level: Some college, no degree  Occupational History   Occupation: disabled    Comment: on SSI awaiting decision about disability  Tobacco Use   Smoking status: Every Day    Current packs/day: 0.50    Average packs/day: 0.5 packs/day for 50.0 years (25.0 ttl pk-yrs)    Types: Cigarettes   Smokeless tobacco: Never   Tobacco comments:    Currently smokes 1 cig every few days  Vaping Use   Vaping status: Former  Substance and Sexual Activity   Alcohol use: Not Currently    Comment: 1 times per month   Drug use: Not Currently    Comment: former crack cocaine user   Sexual activity: Not Currently    Partners: Male    Birth control/protection: Post-menopausal, Surgical  Other Topics Concern   Not on file  Social History Narrative   Live with brother and his husband   Social Drivers of Corporate investment banker Strain: Low Risk  (09/01/2023)   Overall Financial Resource Strain (CARDIA)    Difficulty of Paying Living Expenses: Not very hard  Recent Concern: Financial Resource Strain - Medium Risk (06/10/2023)   Received from [facility]   Overall Financial Resource Strain (CARDIA)    Difficulty of Paying Living Expenses: Somewhat hard  Food Insecurity: No Food Insecurity (09/01/2023)   Hunger Vital Sign    Worried About Running Out of Food in the Last Year: Never true    Ran Out of Food in the Last Year: Never true  Recent Concern: Food Insecurity - Food Insecurity Present (06/28/2023)   Hunger Vital Sign    Worried About Running Out of Food in the Last Year: Sometimes true    Ran Out of Food in the Last Year: Sometimes true  Transportation Needs: No Transportation Needs (09/01/2023)   PRAPARE - Administrator, Civil Service (Medical): No    Lack of Transportation (Non-Medical): No  Physical Activity: Inactive (09/01/2023)   Exercise Vital Sign    Days of Exercise per Week: 0 days    Minutes of Exercise per Session: Not on file  Stress: No Stress Concern Present (09/01/2023)   [employer] of Occupational Health - Occupational Stress Questionnaire    Feeling of Stress: Only a little  Social Connections: Socially Isolated (09/01/2023)   Social Connection and Isolation Panel    Frequency of Communication with Friends and Family: More than three times a week    Frequency of Social Gatherings with Friends and Family: More than three times a week    Attends Religious Services: Never    Database administrator or Organizations: No    Attends Engineer, structural: Not on file    Marital Status: Divorced    Allergies:  Allergies  Allergen Reactions   Pregabalin Other (See Comments)    Suicidal ideation    Metabolic Disorder Labs: Lab Results  Component Value Date   HGBA1C 5.8 (H) 04/08/2023   No results found for: PROLACTIN Lab Results  Component Value Date   CHOL 157 04/08/2023   TRIG 125 04/08/2023   HDL 62 04/08/2023   CHOLHDL 2.6 12/02/2020   LDLCALC 73 04/08/2023   LDLCALC 71 10/05/2022   Lab Results  Component Value Date   TSH 0.661 10/05/2022   TSH 0.801  12/02/2020    Therapeutic Level Labs: No results found for: LITHIUM No results found for:  VALPROATE No results found for: CBMZ  Current Medications: Current Outpatient Medications  Medication Sig Dispense Refill   acetaminophen  (TYLENOL ) 650 MG CR tablet Take 1,300 mg by mouth every 8 (eight) hours as needed for pain.     amLODipine  (NORVASC ) 10 MG tablet Take 1 tablet (10 mg total) by mouth daily. 90 tablet 1   aspirin  81 MG chewable tablet Chew 1 tablet (81 mg total) by mouth 2 (two) times daily.     Calcium  Carb-Cholecalciferol (CALCIUM  600+D3) 600-20 MG-MCG TABS Take 2 tablets by mouth daily.     celecoxib  (CELEBREX ) 200 MG capsule Take 1 capsule (200 mg total) by mouth 2 (two) times daily. 60 capsule 1   doxepin  (SINEQUAN ) 10 MG capsule Take 1 capsule (10 mg total) by mouth at bedtime as needed. For sleep 30 capsule 1   lamoTRIgine  (LAMICTAL ) 100 MG tablet TAKE 1 TABLET BY MOUTH TWICE A DAY 180 tablet 1   methocarbamol  (ROBAXIN ) 500 MG tablet Take 500 mg by mouth 2 (two) times daily.     nicotine  (NICODERM CQ  - DOSED IN MG/24 HOURS) 21 mg/24hr patch Place 1 patch (21 mg total) onto the skin daily. 28 patch 3   rosuvastatin  (CRESTOR ) 5 MG tablet Take 1 tablet (5 mg total) by mouth every other day. TAKE 1 TABLET BY MOUTH EVERY OTHER DAY 45 tablet 3   Semaglutide -Weight Management 1 MG/0.5ML SOAJ Inject 1 mg into the skin once a week. 6 mL 1   traMADol  (ULTRAM ) 50 MG tablet Take 1-2 tablets (50-100 mg total) by mouth every 4 (four) hours as needed for moderate pain (pain score 4-6). 30 tablet 0   No current facility-administered medications for this visit.     Musculoskeletal: Strength & Muscle Tone: UTA Gait & Station: Seated Patient leans: N/A  Psychiatric Specialty Exam: Review of Systems  Psychiatric/Behavioral:  Positive for sleep disturbance.     Last menstrual period 12/07/2011.There is no height or weight on file to calculate BMI.  General Appearance: Casual   Eye Contact:  Fair  Speech:  Clear and Coherent  Volume:  Normal  Mood:  Euthymic  Affect:  Congruent  Thought Process:  Goal Directed and Descriptions of Associations: Intact  Orientation:  Full (Time, Place, and Person)  Thought Content: Logical   Suicidal Thoughts:  No  Homicidal Thoughts:  No  Memory:  Immediate;   Fair Recent;   Fair Remote;   Fair  Judgement:  Fair  Insight:  Fair  Psychomotor Activity:  Normal  Concentration:  Concentration: improving and Attention Span: improving  Recall:  [employer] of Knowledge: Fair  Language: Fair  Akathisia:  No  Handed:  Right  AIMS (if indicated): not done  Assets:  Manufacturing systems engineer Desire for Improvement Housing Social Support Transportation  ADL's:  Intact  Cognition: WNL  Sleep:  varies   Screenings: AIMS    Flowsheet Row Video Visit from 10/16/2021 in [facility] Video Visit from 07/17/2021 in [facility]  AIMS Total Score 0 0   GAD-7    Flowsheet Row Office Visit from 08/17/2023 in [facility] Office Visit from 02/16/2023 in [facility] Office Visit from 10/01/2022 in [facility] Video Visit from 10/16/2021 in [facility] Video Visit from 07/17/2021 in [facility]  Total GAD-7 Score 1 3 7 13  2  PHQ2-9    Flowsheet Row Office Visit from 08/17/2023 in [facility] Office Visit from 02/16/2023 in [facility] Office Visit from 12/21/2022 in [facility] Office Visit from 10/05/2022 in [facility] Office Visit from 10/01/2022 in [facility]  PHQ-2 Total Score 0 0 1 3 0   PHQ-9 Total Score -- -- -- 8 6   Flowsheet Row Video Visit from 09/02/2023 in [facility] Office Visit from 08/17/2023 in [facility] Admission (Discharged) from 06/28/2023 in [facility] SUR[facility] PERIOPER[facility]IVE [facility]  C-SSRS RISK CATEGORY Moderate Risk Moderate Risk Error: Q7 should not be populated when Q6 is No     Assessment and Plan: Alexa Newman is a 63 year old Caucasian female, divorced, on SSI, lives in [place], has a history of bipolar disorder, borderline personality disorder, insomnia was evaluated by telemedicine today.  Discussed assessment and plan as noted below.  Bipolar disorder in remission Currently reports mood symptoms as overall good. Continue Lamictal  100 mg twice daily  Generalized anxiety disorder-stable Denies any significant anxiety symptoms. Continue Hydroxyzine  10 mg as needed for severe anxiety.  Insomnia-unstable Sleep problems may have improved on the doxepin  however she does feel groggy when she wakes up in the morning.  Likely due to lack of sleep hygiene as well as complying with other CNS depressant medications like muscle relaxant. Continue Doxepin  10 mg at bedtime as needed. Discussed with patient to not combine bedtime medications like methocarbamol  and doxepin  together and to space them apart. Discussed sleep hygiene techniques. Discussed with patient to limit naps to 30 to 45 minutes in the afternoon and to try to do it before 2:30 PM.  Follow-up Follow-up in clinic in 2 to 3 months or sooner if needed.   Consent: Patient/Guardian gives verbal consent for treatment and assignment of benefits for services provided during this visit. Patient/Guardian expressed understanding and agreed to proceed.   This note was generated in part or whole with voice recognition software. Voice recognition is usually quite accurate but there are transcription errors that can and very often  do occur. I apologize for any typographical errors that were not detected and corrected.    Omega Durante, MD 09/02/2023, 4:49 PM

## 2023-09-03 ENCOUNTER — Other Ambulatory Visit: Payer: Self-pay | Admitting: Acute Care

## 2023-09-03 DIAGNOSIS — F17210 Nicotine dependence, cigarettes, uncomplicated: Secondary | ICD-10-CM

## 2023-09-03 DIAGNOSIS — Z122 Encounter for screening for malignant neoplasm of respiratory organs: Secondary | ICD-10-CM

## 2023-09-03 DIAGNOSIS — Z87891 Personal history of nicotine dependence: Secondary | ICD-10-CM

## 2023-09-06 ENCOUNTER — Ambulatory Visit: Admitting: Family Medicine

## 2023-09-06 ENCOUNTER — Encounter: Payer: Self-pay | Admitting: Family Medicine

## 2023-09-06 VITALS — BP 131/82 | HR 92 | Ht 63.0 in | Wt 156.3 lb

## 2023-09-06 DIAGNOSIS — E876 Hypokalemia: Secondary | ICD-10-CM

## 2023-09-06 DIAGNOSIS — E66811 Obesity, class 1: Secondary | ICD-10-CM | POA: Diagnosis not present

## 2023-09-06 DIAGNOSIS — I10 Essential (primary) hypertension: Secondary | ICD-10-CM | POA: Diagnosis not present

## 2023-09-06 DIAGNOSIS — R7303 Prediabetes: Secondary | ICD-10-CM | POA: Diagnosis not present

## 2023-09-06 DIAGNOSIS — R1310 Dysphagia, unspecified: Secondary | ICD-10-CM | POA: Diagnosis not present

## 2023-09-06 NOTE — Assessment & Plan Note (Signed)
 Significant weight loss of 36 pounds since starting Wegovy  in October. Current weight is 156 pounds. She expresses concern about losing too much weight and not wanting to go much lower. Weight loss has improved blood pressure and reduced cravings for sweets. No current nausea or adverse effects from Wegovy . - Continue Wegovy  at 1 mg dose. - Reassess weight loss and consider reducing dose to 0.5 mg if weight loss continues beyond desired level. - Consider extending dosing interval to every other week if necessary to maintain weight. - Encourage physical activity to build muscle mass.

## 2023-09-06 NOTE — Progress Notes (Signed)
 Established patient visit   Patient: Alexa Newman   DOB: [birth year]   63 y.o. Female  MRN: [id number] Visit Date: 09/06/2023  Today's healthcare provider: Jon Eva, MD   Chief Complaint  Patient presents with   Medical Management of Chronic Issues   Weight Check    Patient was approved for wegovy  on 01/01/23. She reports tolerating injection well. Nausea when first started but no longer having symptom   Subjective    HPI HPI     Weight Check    Additional comments: Patient was approved for wegovy  on 01/01/23. She reports tolerating injection well. Nausea when first started but no longer having symptom      Last edited by Eva Jon HERO, MD on 09/06/2023 10:52 AM.       Discussed the use of AI scribe software for clinical note transcription with the patient, who gave verbal consent to proceed.  History of Present Illness   Alexa Newman is a 63 year old female who presents for follow-up regarding Wegovy  use and weight management.  She has been on Wegovy  for two months, resulting in a 36-pound weight loss from 192 to 156 pounds. She is concerned about losing more weight and prefers to maintain her current weight. She is on a 1 mg dose of Wegovy .  She recently underwent knee surgery and currently experiences no knee pain. She has not needed injections for back pain in over a year and reports no current back pain. Her blood pressure has improved, and she has a decreased desire for sweets.  She experiences difficulty swallowing pills, a new issue over the past couple of months. She can swallow food without difficulty but struggles with pills, even small ones, and has gagged a couple of times. She takes six pills in the morning and five at night and is interested in alternative forms like gummies or liquids.  She is attempting to quit smoking but has paused her efforts due to receiving coupons for discounted cigarettes. She plans to try quitting again in a  week.  She experiences leg cramps and hopes her potassium levels are normal. She is increasing her physical activity by walking more and swimming in her daughter's pool.      Medications: Outpatient Medications Prior to Visit  Medication Sig   acetaminophen  (TYLENOL ) 650 MG CR tablet Take 1,300 mg by mouth every 8 (eight) hours as needed for pain.   amLODipine  (NORVASC ) 10 MG tablet Take 1 tablet (10 mg total) by mouth daily.   aspirin  81 MG chewable tablet Chew 1 tablet (81 mg total) by mouth 2 (two) times daily.   Calcium  Carb-Cholecalciferol (CALCIUM  600+D3) 600-20 MG-MCG TABS Take 2 tablets by mouth daily.   celecoxib  (CELEBREX ) 200 MG capsule Take 1 capsule (200 mg total) by mouth 2 (two) times daily.   doxepin  (SINEQUAN ) 10 MG capsule Take 1 capsule (10 mg total) by mouth at bedtime as needed. For sleep   lamoTRIgine  (LAMICTAL ) 100 MG tablet TAKE 1 TABLET BY MOUTH TWICE A DAY   methocarbamol  (ROBAXIN ) 500 MG tablet Take 500 mg by mouth 2 (two) times daily.   nicotine  (NICODERM CQ  - DOSED IN MG/24 HOURS) 21 mg/24hr patch Place 1 patch (21 mg total) onto the skin daily.   rosuvastatin  (CRESTOR ) 5 MG tablet Take 1 tablet (5 mg total) by mouth every other day. TAKE 1 TABLET BY MOUTH EVERY OTHER DAY   Semaglutide -Weight Management 1 MG/0.5ML SOAJ Inject 1 mg into  the skin once a week.   traMADol  (ULTRAM ) 50 MG tablet Take 1-2 tablets (50-100 mg total) by mouth every 4 (four) hours as needed for moderate pain (pain score 4-6).   No facility-administered medications prior to visit.    Review of Systems     Objective    BP 131/82 (BP Location: Right Arm, Patient Position: Sitting, Cuff Size: Normal)   Pulse 92   Ht 5' 3 (1.6 m)   Wt 156 lb 4.8 oz (70.9 kg)   LMP 12/07/2011   BMI 27.69 kg/m  BP Readings from Last 3 Encounters:  09/06/23 131/82  06/29/23 (!) 140/80  06/22/23 109/77   Wt Readings from Last 3 Encounters:  09/06/23 156 lb 4.8 oz (70.9 kg)  06/28/23 165 lb (74.8  kg)  06/22/23 165 lb (74.8 kg)      Physical Exam Vitals reviewed.  Constitutional:      General: She is not in acute distress.    Appearance: Normal appearance. She is well-developed. She is not diaphoretic.  HENT:     Head: Normocephalic and atraumatic.   Eyes:     General: No scleral icterus.    Conjunctiva/sclera: Conjunctivae normal.   Neck:     Thyroid : No thyromegaly.   Cardiovascular:     Rate and Rhythm: Normal rate and regular rhythm.     Heart sounds: Normal heart sounds. No murmur heard. Pulmonary:     Effort: Pulmonary effort is normal. No respiratory distress.     Breath sounds: Normal breath sounds. No wheezing, rhonchi or rales.   Musculoskeletal:     Cervical back: Neck supple.     Right lower leg: No edema.     Left lower leg: No edema.  Lymphadenopathy:     Cervical: No cervical adenopathy.   Skin:    General: Skin is warm and dry.     Findings: No rash.   Neurological:     Mental Status: She is alert and oriented to person, place, and time. Mental status is at baseline.   Psychiatric:        Mood and Affect: Mood normal.        Behavior: Behavior normal.      No results found for any visits on 09/06/23.  Assessment & Plan     Problem List Items Addressed This Visit       Cardiovascular and Mediastinum   Essential hypertension - Primary   Improved blood pressure reported, likely due to weight loss.        Other   Obesity   Significant weight loss of 36 pounds since starting Wegovy  in October. Current weight is 156 pounds. She expresses concern about losing too much weight and not wanting to go much lower. Weight loss has improved blood pressure and reduced cravings for sweets. No current nausea or adverse effects from Wegovy . - Continue Wegovy  at 1 mg dose. - Reassess weight loss and consider reducing dose to 0.5 mg if weight loss continues beyond desired level. - Consider extending dosing interval to every other week if necessary to  maintain weight. - Encourage physical activity to build muscle mass.      Hypokalemia   Relevant Orders   Basic Metabolic Panel (BMET)   Prediabetes   Improved blood sugar control reported, likely due to weight loss. Last blood sugar check was six months ago. - Order blood sugar test to assess current control.      Relevant Orders   Hemoglobin A1c  Other Visit Diagnoses       Dysphagia, unspecified type               Difficulty swallowing pills New onset difficulty swallowing pills, described as an aversion rather than a mechanical issue. No difficulty swallowing food. Possible psychological component or aversion related to Wegovy  use. - Consult pharmacist to determine which medications can be crushed or are available in alternative formulations.  Nicotine  dependence She is taking a break from attempting to quit smoking due to feeling obsessed with quitting. Plans to try quitting again in one week.  Chronic low back pain No back pain reported. Improvement noted, possibly related to weight loss and increased physical activity.  Status post bilateral knee arthroplasty No knee pain reported. Significant improvement in knee function and pain relief since surgery. Continues to perform exercises at home to maintain knee function.  Leg cramps Leg cramps reported. Potassium was low around the time of surgery. - Order potassium level to assess current status.      Return in about 3 months (around 12/07/2023) for weight f/u.       Jon Eva, MD  [facility] [phone] (phone) [phone] (fax)  [facility]

## 2023-09-06 NOTE — Assessment & Plan Note (Signed)
 Improved blood sugar control reported, likely due to weight loss. Last blood sugar check was six months ago. - Order blood sugar test to assess current control.

## 2023-09-06 NOTE — Assessment & Plan Note (Signed)
 Improved blood pressure reported, likely due to weight loss.

## 2023-09-07 ENCOUNTER — Ambulatory Visit: Payer: Self-pay | Admitting: Family Medicine

## 2023-09-07 LAB — HEMOGLOBIN A1C
Est. average glucose Bld gHb Est-mCnc: 111 mg/dL
Hgb A1c MFr Bld: 5.5 % (ref 4.8–5.6)

## 2023-09-07 LAB — BASIC METABOLIC PANEL WITH GFR
BUN/Creatinine Ratio: 19 (ref 12–28)
BUN: 15 mg/dL (ref 8–27)
CO2: 20 mmol/L (ref 20–29)
Calcium: 9.9 mg/dL (ref 8.7–10.3)
Chloride: 104 mmol/L (ref 96–106)
Creatinine, Ser: 0.81 mg/dL (ref 0.57–1.00)
Glucose: 100 mg/dL — ABNORMAL HIGH (ref 70–99)
Potassium: 3.6 mmol/L (ref 3.5–5.2)
Sodium: 142 mmol/L (ref 134–144)
eGFR: 82 mL/min/{1.73_m2} (ref >59)

## 2023-09-21 ENCOUNTER — Ambulatory Visit
Admission: RE | Admit: 2023-09-21 | Discharge: 2023-09-21 | Disposition: A | Discharge status: Home / Self Care | Source: Ambulatory Visit | Attending: Acute Care | Admitting: Acute Care

## 2023-09-21 DIAGNOSIS — Z122 Encounter for screening for malignant neoplasm of respiratory organs: Secondary | ICD-10-CM | POA: Insufficient documentation

## 2023-09-21 DIAGNOSIS — Z87891 Personal history of nicotine dependence: Secondary | ICD-10-CM | POA: Diagnosis not present

## 2023-09-21 DIAGNOSIS — F17210 Nicotine dependence, cigarettes, uncomplicated: Secondary | ICD-10-CM | POA: Insufficient documentation

## 2023-09-30 ENCOUNTER — Other Ambulatory Visit: Payer: Self-pay

## 2023-09-30 DIAGNOSIS — Z87891 Personal history of nicotine dependence: Secondary | ICD-10-CM

## 2023-09-30 DIAGNOSIS — F17210 Nicotine dependence, cigarettes, uncomplicated: Secondary | ICD-10-CM

## 2023-09-30 DIAGNOSIS — Z122 Encounter for screening for malignant neoplasm of respiratory organs: Secondary | ICD-10-CM

## 2023-11-14 ENCOUNTER — Other Ambulatory Visit: Payer: Self-pay | Admitting: Psychiatry

## 2023-11-14 DIAGNOSIS — F411 Generalized anxiety disorder: Secondary | ICD-10-CM

## 2023-11-26 ENCOUNTER — Telehealth (INDEPENDENT_AMBULATORY_CARE_PROVIDER_SITE_OTHER): Admitting: Psychiatry

## 2023-11-26 ENCOUNTER — Encounter: Payer: Self-pay | Admitting: Psychiatry

## 2023-11-26 DIAGNOSIS — F1421 Cocaine dependence, in remission: Secondary | ICD-10-CM

## 2023-11-26 DIAGNOSIS — F5105 Insomnia due to other mental disorder: Secondary | ICD-10-CM

## 2023-11-26 DIAGNOSIS — F17201 Nicotine dependence, unspecified, in remission: Secondary | ICD-10-CM

## 2023-11-26 DIAGNOSIS — Z8659 Personal history of other mental and behavioral disorders: Secondary | ICD-10-CM | POA: Diagnosis not present

## 2023-11-26 DIAGNOSIS — F17200 Nicotine dependence, unspecified, uncomplicated: Secondary | ICD-10-CM

## 2023-11-26 DIAGNOSIS — F3176 Bipolar disorder, in full remission, most recent episode depressed: Secondary | ICD-10-CM

## 2023-11-26 DIAGNOSIS — F411 Generalized anxiety disorder: Secondary | ICD-10-CM | POA: Diagnosis not present

## 2023-11-26 NOTE — Progress Notes (Signed)
 Virtual Visit via Video Note  I connected with Alexa Newman on 11/26/23 at 11:40 AM EDT by a video enabled telemedicine application and verified that I am speaking with the correct person using two identifiers.  Location Provider Location : [facility] Patient Location : Car  Participants: Patient , Provider   I discussed the limitations of evaluation and management by telemedicine and the availability of in person appointments. The patient expressed understanding and agreed to proceed.   I discussed the assessment and treatment plan with the patient. The patient was provided an opportunity to ask questions and all were answered. The patient agreed with the plan and demonstrated an understanding of the instructions.   The patient was advised to call back or seek an in-person evaluation if the symptoms worsen or if the condition fails to improve as anticipated.   BH MD OP Progress Note  11/26/2023 10:19 AM Alexa Newman  MRN:  [id number]  Chief Complaint:  Chief Complaint  Patient presents with   Medication Refill   Depression   Anxiety   Follow-up   Discussed the use of AI scribe software for clinical note transcription with the patient, who gave verbal consent to proceed.  History of Present Illness Alexa Newman is a 63 year old Caucasian female, divorced, on SSI, lives in [place], has a history of bipolar disorder, borderline personality disorder, cocaine use disorder, GAD, insomnia, tobacco use disorder was evaluated by telemedicine today.  Over the past month, she reports significant improvement in her sleep, stating that she now falls asleep easily without difficulty. She notes that she no longer needs doxepin  and has not used any sleep aids recently. She has made limiting daytime naps part of her routine, though she occasionally dozes off briefly while engaged in activities such as sewing.  Emotionally, she describes being in a good place and does not identify any  current stressors or worries. She denies any thoughts about hurting herself or others. She enjoys her current state of well-being and expresses satisfaction with her progress.  She continues to take Lamictal  100 mg twice daily.  She reports current cigarette use, approximately 1 pack per week. She states that she keeps cigarettes in the car and does not smoke inside the home. Her routine includes smoking the last cigarette of the day outside at 11 PM. She denies use of nicotine  patches. She reports occasional use of a vape, typically taking 3 puffs as needed, but not using it regularly. She reports that support from her brother, who is also quitting smoking, has contributed to her reduction in cigarette use.  She did not fill the nicotine  patch that was sent out to pharmacy.    Visit Diagnosis:    ICD-10-CM   1. Bipolar disorder, in full remission, most recent episode depressed (HCC)  F31.76     2. GAD (generalized anxiety disorder)  F41.1     3. Insomnia due to mental condition  F51.05    Pain, mood symptoms    4. Tobacco use disorder  F17.200    moderate in partial remission    5. Hx of borderline personality disorder  Z86.59     6. Cocaine use disorder, moderate, in sustained remission (HCC)  F14.21       Past Psychiatric History: I have reviewed past psychiatric history from progress note on 07/27/2017.  Past Medical History:  Past Medical History:  Diagnosis Date   Anxiety 1980's   Arthritis 2000   Bipolar 1 disorder (HCC)  Depression 1980's   Family history of adverse reaction to anesthesia    half sister claims that she would stop breathing during surgery x 2   Hypertension 2010   Osteoarthritis    PONV (postoperative nausea and vomiting)    Pre-diabetes     Past Surgical History:  Procedure Laterality Date   ANTERIOR CRUCIATE LIGAMENT (ACL) REVISION Right    CARPAL TUNNEL RELEASE     COLONOSCOPY     COLONOSCOPY WITH PROPOFOL  N/A 11/17/2017   Procedure:  COLONOSCOPY WITH PROPOFOL ;  Surgeon: Janalyn Keene NOVAK, MD;  Location: [facility] ENDOSCOPY;  Service: Endoscopy;  Laterality: N/A;   COLONOSCOPY WITH PROPOFOL  N/A 11/24/2022   Procedure: COLONOSCOPY WITH PROPOFOL ;  Surgeon: Therisa Bi, MD;  [facility] ENDOSCOPY;  Service: Gastroenterology;  Laterality: N/A;   COLONOSCOPY WITH PROPOFOL  N/A 01/25/2023   Procedure: COLONOSCOPY WITH PROPOFOL ;  Surgeon: Therisa Bi, MD;  Location: [facility] ENDOSCOPY;  Service: Gastroenterology;  Laterality: N/A;   DILATION AND CURETTAGE OF UTERUS  2013   for heavy bleeding   ENDOMETRIAL ABLATION  2013   JOINT REPLACEMENT  2021   Rt knee replacement   KNEE ARTHROPLASTY Right 10/02/2020   Procedure: COMPUTER ASSISTED TOTAL KNEE ARTHROPLASTY;  Surgeon: Mardee Lynwood SQUIBB, MD;  Location: [facility] ORS;  Service: Orthopedics;  Laterality: Right;   KNEE ARTHROPLASTY Left 06/28/2023   Procedure: ARTHROPLASTY, KNEE, TOTAL, USING IMAGELESS COMPUTER-ASSISTED NAVIGATION;  Surgeon: Mardee Lynwood SQUIBB, MD;  Location: [facility] ORS;  Service: Orthopedics;  Laterality: Left;   POLYPECTOMY  01/25/2023   Procedure: POLYPECTOMY;  Surgeon: Therisa Bi, MD;  Location: [facility] ENDOSCOPY;  Service: Gastroenterology;;   right wrist surgery     TUBAL LIGATION  1990   WRIST ARTHROSCOPY Left 1990   for degenerative changes    Family Psychiatric History: I have reviewed family psychiatric history from progress note on 07/27/2017.  Family History:  Family History  Problem Relation Age of Onset   Diabetes Mother    Depression Mother    Hypertension Mother    Crohn's disease Mother    Lung cancer Mother 31       former smoker   Congestive Heart Failure Father    Alcohol abuse Father    Breast cancer Sister 82   Alcohol abuse Sister    Drug abuse Sister    Anxiety disorder Sister    Depression Sister    Bipolar disorder Sister    Hypertension Sister    Alcohol abuse Brother    Drug abuse Brother    Anxiety disorder Brother    Depression Brother     Colon polyps Brother    HIV Brother    Hypertension Brother    Alcohol abuse Brother    Drug abuse Brother    Anxiety disorder Brother    Depression Brother    Colon polyps Brother    HIV Brother    Hypertension Brother    Skin cancer Maternal Grandfather    Colon cancer Neg Hx    Ovarian cancer Neg Hx    Cervical cancer Neg Hx     Social History: I have reviewed social history from progress note on 07/27/2017. Social History   Socioeconomic History   Marital status: Divorced    Spouse name: Not on file   Number of children: 1   Years of education: Not on file   Highest education level: Some college, no degree  Occupational History   Occupation: disabled    Comment: on SSI awaiting decision about disability  Tobacco Use   Smoking status: Every Day    Current packs/day: 0.50    Average packs/day: 0.5 packs/day for 50.0 years (25.0 ttl pk-yrs)    Types: Cigarettes   Smokeless tobacco: Never   Tobacco comments:    Currently smokes 1 cig every few days  Vaping Use   Vaping status: Former  Substance and Sexual Activity   Alcohol use: Not Currently    Comment: 1 times per month   Drug use: Not Currently    Comment: former crack cocaine user   Sexual activity: Not Currently    Partners: Male    Birth control/protection: Post-menopausal, Surgical  Other Topics Concern   Not on file  Social History Narrative   Live with brother and his husband   Social Drivers of Corporate investment banker Strain: Low Risk  (09/01/2023)   Overall Financial Resource Strain (CARDIA)    Difficulty of Paying Living Expenses: Not very hard  Recent Concern: Financial Resource Strain - Medium Risk (06/10/2023)   Received from [facility]   Overall Financial Resource Strain (CARDIA)    Difficulty of Paying Living Expenses: Somewhat hard  Food Insecurity: No Food Insecurity (09/01/2023)   Hunger Vital Sign    Worried About Running Out of Food in the Last Year: Never true     Ran Out of Food in the Last Year: Never true  Recent Concern: Food Insecurity - Food Insecurity Present (06/28/2023)   Hunger Vital Sign    Worried About Running Out of Food in the Last Year: Sometimes true    Ran Out of Food in the Last Year: Sometimes true  Transportation Needs: No Transportation Needs (09/01/2023)   PRAPARE - Administrator, Civil Service (Medical): No    Lack of Transportation (Non-Medical): No  Physical Activity: Inactive (09/01/2023)   Exercise Vital Sign    Days of Exercise per Week: 0 days    Minutes of Exercise per Session: Not on file  Stress: No Stress Concern Present (09/01/2023)   [employer] of Occupational Health - Occupational Stress Questionnaire    Feeling of Stress: Only a little  Social Connections: Socially Isolated (09/01/2023)   Social Connection and Isolation Panel    Frequency of Communication with Friends and Family: More than three times a week    Frequency of Social Gatherings with Friends and Family: More than three times a week    Attends Religious Services: Never    Database administrator or Organizations: No    Attends Engineer, structural: Not on file    Marital Status: Divorced    Allergies:  Allergies  Allergen Reactions   Pregabalin Other (See Comments)    Suicidal ideation    Metabolic Disorder Labs: Lab Results  Component Value Date   HGBA1C 5.5 09/06/2023   No results found for: PROLACTIN Lab Results  Component Value Date   CHOL 157 04/08/2023   TRIG 125 04/08/2023   HDL 62 04/08/2023   CHOLHDL 2.6 12/02/2020   LDLCALC 73 04/08/2023   LDLCALC 71 10/05/2022   Lab Results  Component Value Date   TSH 0.661 10/05/2022   TSH 0.801 12/02/2020    Therapeutic Level Labs: No results found for: LITHIUM No results found for: VALPROATE No results found for: CBMZ  Current Medications: Current Outpatient Medications  Medication Sig Dispense Refill   acetaminophen  (TYLENOL ) 650 MG  CR tablet Take 1,300 mg by mouth every 8 (eight) hours as needed for  pain.     amLODipine  (NORVASC ) 10 MG tablet Take 1 tablet (10 mg total) by mouth daily. 90 tablet 1   aspirin  81 MG chewable tablet Chew 1 tablet (81 mg total) by mouth 2 (two) times daily.     Calcium  Carb-Cholecalciferol (CALCIUM  600+D3) 600-20 MG-MCG TABS Take 2 tablets by mouth daily.     celecoxib  (CELEBREX ) 200 MG capsule Take 1 capsule (200 mg total) by mouth 2 (two) times daily. 60 capsule 1   doxepin  (SINEQUAN ) 10 MG capsule Take 1 capsule (10 mg total) by mouth at bedtime as needed. For sleep 30 capsule 1   lamoTRIgine  (LAMICTAL ) 100 MG tablet TAKE 1 TABLET BY MOUTH TWICE A DAY 180 tablet 1   methocarbamol  (ROBAXIN ) 500 MG tablet Take 500 mg by mouth 2 (two) times daily.     rosuvastatin  (CRESTOR ) 5 MG tablet Take 1 tablet (5 mg total) by mouth every other day. TAKE 1 TABLET BY MOUTH EVERY OTHER DAY 45 tablet 3   Semaglutide -Weight Management 1 MG/0.5ML SOAJ Inject 1 mg into the skin once a week. 6 mL 1   traMADol  (ULTRAM ) 50 MG tablet Take 1-2 tablets (50-100 mg total) by mouth every 4 (four) hours as needed for moderate pain (pain score 4-6). 30 tablet 0   No current facility-administered medications for this visit.     Musculoskeletal: Strength & Muscle Tone: UTA Gait & Station: Seated Patient leans: N/A  Psychiatric Specialty Exam: Review of Systems  Psychiatric/Behavioral: Negative.      Last menstrual period 12/07/2011.There is no height or weight on file to calculate BMI.  General Appearance: Casual  Eye Contact:  Fair  Speech:  Clear and Coherent  Volume:  Normal  Mood:  Euthymic  Affect:  Congruent  Thought Process:  Goal Directed and Descriptions of Associations: Intact  Orientation:  Full (Time, Place, and Person)  Thought Content: Logical   Suicidal Thoughts:  No  Homicidal Thoughts:  No  Memory:  Immediate;   Fair Recent;   Fair Remote;   Fair  Judgement:  Fair  Insight:  Fair   Psychomotor Activity:  Normal  Concentration:  Concentration: Fair and Attention Span: Fair  Recall:  [employer] of Knowledge: Fair  Language: Fair  Akathisia:  No  Handed:  Right  AIMS (if indicated): not done  Assets:  Communication Skills Desire for Improvement Housing Social Support  ADL's:  Intact  Cognition: WNL  Sleep:  Fair   Screenings: AIMS    Flowsheet Row Video Visit from 10/16/2021 in [facility] Video Visit from 07/17/2021 in [facility]  AIMS Total Score 0 0   GAD-7    Flowsheet Row Office Visit from 08/17/2023 in [facility] Office Visit from 02/16/2023 in [facility] Office Visit from 10/01/2022 in [facility] Video Visit from 10/16/2021 in [facility] Video Visit from 07/17/2021 in [facility]  Total GAD-7 Score 1 3 7 13 2    PHQ2-9    Flowsheet Row Office Visit from 08/17/2023 in [facility] Office Visit from 02/16/2023 in [facility] Office Visit from 12/21/2022 in [facility] Office Visit from 10/05/2022 in [facility] Office Visit from 10/01/2022 in [facility]  PHQ-2 Total Score 0 0 1 3  0  PHQ-9 Total Score -- -- -- 8 6   Flowsheet Row Video Visit from 11/26/2023 in [facility] Video Visit from 09/02/2023 in [facility] Office Visit from 08/17/2023 in [facility]  C-SSRS RISK CATEGORY Moderate Risk Moderate Risk Moderate Risk     Assessment and Plan: Alexa Newman is a 63 year old  Caucasian female, divorced, on SSI, lives in [place], has a history of bipolar disorder, borderline personality disorder, insomnia was evaluated by telemedicine today.  1. Bipolar disorder, in full remission, most recent episode depressed (HCC) Currently denies any significant symptoms Continue Lamotrigine  100 mg twice daily  2. GAD (generalized anxiety disorder)-stable Reports anxiety symptoms as manageable. Continue Hydroxyzine  10 mg daily as needed  3. Insomnia due to mental condition-improving Reports sleep has been good since the past several weeks.  Does have doxepin  although not using it. Continue Doxepin  10 mg at bedtime as needed  4. Tobacco use disorder in partial remission Currently cutting back on smoking. Will reevaluate in future sessions.  5. Hx of borderline personality disorder Will monitor closely  6. Cocaine use disorder, moderate, in sustained remission (HCC) Currently sober   Follow-up Follow-up in clinic in 3 months or sooner if needed.  Consent: Patient/Guardian gives verbal consent for treatment and assignment of benefits for services provided during this visit. Patient/Guardian expressed understanding and agreed to proceed.   This note was generated in part or whole with voice recognition software. Voice recognition is usually quite accurate but there are transcription errors that can and very often do occur. I apologize for any typographical errors that were not detected and corrected.    Brett Soza, MD 11/28/2023, 6:33 PM

## 2023-12-07 ENCOUNTER — Ambulatory Visit: Admitting: Family Medicine

## 2023-12-07 ENCOUNTER — Encounter: Payer: Self-pay | Admitting: Family Medicine

## 2023-12-07 VITALS — BP 130/84 | HR 89 | Ht 63.0 in | Wt 148.0 lb

## 2023-12-07 DIAGNOSIS — F17200 Nicotine dependence, unspecified, uncomplicated: Secondary | ICD-10-CM

## 2023-12-07 DIAGNOSIS — E663 Overweight: Secondary | ICD-10-CM | POA: Diagnosis not present

## 2023-12-07 DIAGNOSIS — Z23 Encounter for immunization: Secondary | ICD-10-CM | POA: Diagnosis not present

## 2023-12-07 DIAGNOSIS — R7303 Prediabetes: Secondary | ICD-10-CM | POA: Diagnosis not present

## 2023-12-07 MED ORDER — NALTREXONE HCL 50 MG PO TABS: 25.0000 mg | ORAL_TABLET | Freq: Every day | ORAL | 3 refills | Status: AC

## 2023-12-07 MED ORDER — BUPROPION HCL ER (XL) 150 MG PO TB24: 150.0000 mg | ORAL_TABLET | Freq: Every day | ORAL | 2 refills | Status: DC

## 2023-12-07 NOTE — Assessment & Plan Note (Signed)
 Cutting back on tobacco use Wellbutrin may continue to help with cessation efforts

## 2023-12-07 NOTE — Assessment & Plan Note (Signed)
 Reviewed last A1c Will need to monitor off of GLP1

## 2023-12-07 NOTE — Assessment & Plan Note (Signed)
 Obesity management is complicated by insurance coverage issues for medications like Wegovy . She has experienced significant weight loss and improved well-being with Wegovy , but coverage is being discontinued. Discussed alternative options including Wellbutrin and naltrexone (Contrave) for weight maintenance. Wellbutrin has been effective in the past for weight loss and smoking cessation. Naltrexone can help with cravings but may reduce the efficacy of opiates. The goal is to maintain current weight and well-being. Wellbutrin and naltrexone are cost-effective options covered by Medicaid. - Prescribe Wellbutrin 150 mg extended release once daily in the morning. - Prescribe naltrexone 50 mg, instruct to take half a pill daily, with the option to quarter the pill if tolerated. - Advise to start Wellbutrin and naltrexone one week after the last dose of Wegovy . - Discuss potential side effects of naltrexone, including stomach upset and reduced efficacy of opiates. - Encourage exercise to aid in weight maintenance.

## 2023-12-07 NOTE — Progress Notes (Signed)
 Established patient visit   Patient: Alexa Newman   DOB: 04-Jun-1960   63 y.o. Female  MRN: [id number] Visit Date: 12/07/2023  Today's healthcare provider: Jon Eva, MD   Chief Complaint  Patient presents with   Medical Management of Chronic Issues    Patient is present for 3 month weight follow-up. She was last seen in office on 09/06/23. Patient was to continue Wegovy  at 1 mg dose and return for reassessment of weight loss and consider reducing dose to 0.5 mg if weight loss continues beyond desired level, as well as consider extending dosing interval to every other week if necessary to maintain weight. Patient was encourage to complete physical activity to build muscle mass.    Weight Check   Subjective    HPI HPI     Medical Management of Chronic Issues    Additional comments: Patient is present for 3 month weight follow-up. She was last seen in office on 09/06/23. Patient was to continue Wegovy  at 1 mg dose and return for reassessment of weight loss and consider reducing dose to 0.5 mg if weight loss continues beyond desired level, as well as consider extending dosing interval to every other week if necessary to maintain weight. Patient was encourage to complete physical activity to build muscle mass.       Last edited by Lilian Fitzpatrick, CMA on 12/07/2023  8:45 AM.       Discussed the use of AI scribe software for clinical note transcription with the patient, who gave verbal consent to proceed.  History of Present Illness   Alexa Newman is a 63 year old female who presents with concerns about weight management and medication coverage.  Belissa has experienced significant weight loss, which she attributes to increased physical activity, and is concerned about maintaining her current weight. She is frustrated with insurance coverage for weight management medications, specifically Wegovy  and Zepbound, and is concerned about transitioning off Wegovy  due to  these issues. She recalls using Wellbutrin in the past for bipolar disorder, which helped reduce smoking and contributed to weight loss. Currently, she takes Lamictal  for bipolar disorder and does not have a seizure disorder.  She has a family history of heart disease, which adds to her health concerns. She has significantly reduced her smoking and is considering starting to exercise to maintain her current weight and improve her overall health. She feels better with these lifestyle changes.         Medications: Outpatient Medications Prior to Visit  Medication Sig   acetaminophen  (TYLENOL ) 650 MG CR tablet Take 1,300 mg by mouth every 8 (eight) hours as needed for pain.   amLODipine  (NORVASC ) 10 MG tablet Take 1 tablet (10 mg total) by mouth daily.   aspirin  81 MG chewable tablet Chew 1 tablet (81 mg total) by mouth 2 (two) times daily.   celecoxib  (CELEBREX ) 200 MG capsule Take 1 capsule (200 mg total) by mouth 2 (two) times daily.   doxepin  (SINEQUAN ) 10 MG capsule Take 1 capsule (10 mg total) by mouth at bedtime as needed. For sleep   lamoTRIgine  (LAMICTAL ) 100 MG tablet TAKE 1 TABLET BY MOUTH TWICE A DAY   methocarbamol  (ROBAXIN ) 500 MG tablet Take 500 mg by mouth 2 (two) times daily.   rosuvastatin  (CRESTOR ) 5 MG tablet Take 1 tablet (5 mg total) by mouth every other day. TAKE 1 TABLET BY MOUTH EVERY OTHER DAY   Semaglutide -Weight Management 1 MG/0.5ML SOAJ Inject 1 mg  into the skin once a week.   traMADol  (ULTRAM ) 50 MG tablet Take 1-2 tablets (50-100 mg total) by mouth every 4 (four) hours as needed for moderate pain (pain score 4-6).   [DISCONTINUED] Calcium  Carb-Cholecalciferol (CALCIUM  600+D3) 600-20 MG-MCG TABS Take 2 tablets by mouth daily.   No facility-administered medications prior to visit.    Review of Systems     Objective    BP 130/84 (BP Location: Left Arm, Patient Position: Sitting, Cuff Size: Normal)   Pulse 89   Ht 5' 3 (1.6 m)   Wt 148 lb (67.1 kg)   LMP  12/07/2011   SpO2 99%   BMI 26.22 kg/m    Wt Readings from Last 3 Encounters:  12/07/23 148 lb (67.1 kg)  09/21/23 151 lb (68.5 kg)  09/06/23 156 lb 4.8 oz (70.9 kg)       Physical Exam Vitals reviewed.  Constitutional:      General: She is not in acute distress.    Appearance: Normal appearance. She is well-developed. She is not diaphoretic.  HENT:     Head: Normocephalic and atraumatic.  Eyes:     General: No scleral icterus.    Conjunctiva/sclera: Conjunctivae normal.  Neck:     Thyroid : No thyromegaly.  Cardiovascular:     Rate and Rhythm: Normal rate and regular rhythm.     Heart sounds: Normal heart sounds. No murmur heard. Pulmonary:     Effort: Pulmonary effort is normal. No respiratory distress.     Breath sounds: Normal breath sounds. No wheezing, rhonchi or rales.  Musculoskeletal:     Cervical back: Neck supple.     Right lower leg: No edema.     Left lower leg: No edema.  Lymphadenopathy:     Cervical: No cervical adenopathy.  Skin:    General: Skin is warm and dry.     Findings: No rash.  Neurological:     Mental Status: She is alert and oriented to person, place, and time. Mental status is at baseline.  Psychiatric:        Mood and Affect: Mood normal.        Behavior: Behavior normal.      No results found for any visits on 12/07/23.  Assessment & Plan     Problem List Items Addressed This Visit       Other   Tobacco use disorder   Cutting back on tobacco use Wellbutrin may continue to help with cessation efforts      Overweight - Primary   Obesity management is complicated by insurance coverage issues for medications like Wegovy . She has experienced significant weight loss and improved well-being with Wegovy , but coverage is being discontinued. Discussed alternative options including Wellbutrin and naltrexone (Contrave) for weight maintenance. Wellbutrin has been effective in the past for weight loss and smoking cessation. Naltrexone can  help with cravings but may reduce the efficacy of opiates. The goal is to maintain current weight and well-being. Wellbutrin and naltrexone are cost-effective options covered by Medicaid. - Prescribe Wellbutrin 150 mg extended release once daily in the morning. - Prescribe naltrexone 50 mg, instruct to take half a pill daily, with the option to quarter the pill if tolerated. - Advise to start Wellbutrin and naltrexone one week after the last dose of Wegovy . - Discuss potential side effects of naltrexone, including stomach upset and reduced efficacy of opiates. - Encourage exercise to aid in weight maintenance.      Prediabetes   Reviewed last  A1c Will need to monitor off of GLP1      Other Visit Diagnoses       Immunization due       Relevant Orders   Flu vaccine trivalent PF, 6mos and older(Flulaval,Afluria,Fluarix,Fluzone)         Return in about 4 months (around 04/07/2024) for CPE.       Jon Eva, MD  [facility] [phone] (phone) [phone] (fax)  [facility]

## 2023-12-16 ENCOUNTER — Ambulatory Visit
Admission: RE | Admit: 2023-12-16 | Discharge: 2023-12-16 | Disposition: A | Discharge status: Home / Self Care | Source: Ambulatory Visit | Attending: Family Medicine | Admitting: Family Medicine

## 2023-12-16 ENCOUNTER — Ambulatory Visit: Admitting: Family Medicine

## 2023-12-16 ENCOUNTER — Encounter: Payer: Self-pay | Admitting: Family Medicine

## 2023-12-16 ENCOUNTER — Ambulatory Visit
Admission: RE | Admit: 2023-12-16 | Discharge: 2023-12-16 | Disposition: A | Discharge status: Home / Self Care | Attending: Family Medicine | Admitting: Family Medicine

## 2023-12-16 VITALS — BP 140/88 | HR 90 | Ht 63.0 in | Wt 144.3 lb

## 2023-12-16 DIAGNOSIS — M25511 Pain in right shoulder: Secondary | ICD-10-CM | POA: Insufficient documentation

## 2023-12-16 NOTE — Progress Notes (Signed)
      Acute visit   Patient: Alexa Newman   DOB: 1960/09/13   63 y.o. Female  MRN: [id number] PCP: Myrla Jon HERO, MD   Chief Complaint  Patient presents with   Acute Visit    Patient is present due to Pain in my right shoulder area. Had for about 2 weeks but it's getting worse   Shoulder Pain    Patient reports no radiating and pain only present when she is using her right arm or raises it. She reports not taking anything for pain.    Subjective    Discussed the use of AI scribe software for clinical note transcription with the patient, who gave verbal consent to proceed.  History of Present Illness   Alexa Newman is a 63 year old female who presents with right shoulder pain.  She has experienced right shoulder pain for one month, worsened by activities such as mowing the lawn and performing overhead motions like brushing her hair and putting on T-shirts. The pain is localized to the shoulder without radiation or swelling.  Numbness and tingling occur in the right arm and hand frequently but are not constant. There is no weakness in the arm.  Her current medication is Celebrex  for anti-inflammatory purposes. She is cautious about taking additional medications due to concerns about kidney function and prefers Celebrex  over ibuprofen .       Review of Systems  Objective    BP (!) 140/88 (BP Location: Left Arm, Patient Position: Sitting, Cuff Size: Normal)   Pulse 90   Ht 5' 3 (1.6 m)   Wt 144 lb 4.8 oz (65.5 kg)   LMP 12/07/2011   SpO2 100%   BMI 25.56 kg/m   Physical Exam   R shoulder: No TTP, normal appearance, limited abduction due to pain, pain with resisted internal rotation, rotator cuff strength intact  No results found for any visits on 12/16/23.  Assessment & Plan     Problem List Items Addressed This Visit   None Visit Diagnoses       Acute pain of right shoulder    -  Primary   Relevant Orders   Ambulatory referral to Orthopedic Surgery    DG Shoulder Right           Right shoulder pain with suspected rotator cuff pathology and primary osteoarthritis Right shoulder pain for approximately one month, exacerbated by overhead activities and internal rotation. Pain is localized to the shoulder without radiation. Examination reveals pain with resisted internal rotation and limited abduction due to pain, but no evidence of frozen shoulder. Strength remains good, suggesting intact rotator cuff function. Suspected rotator cuff pathology and primary osteoarthritis due to crepitus noted during examination. - Continue Celebrex  for anti-inflammatory effect. - Apply ice to the shoulder after activities to reduce inflammation. - Order right shoulder X-ray to assess for structural abnormalities. - Refer to orthopedics for further evaluation and management.       No orders of the defined types were placed in this encounter.    Return if symptoms worsen or fail to improve.      Jon Myrla, MD  [facility] [phone] (phone) [phone] (fax)  [facility]

## 2023-12-21 ENCOUNTER — Ambulatory Visit: Payer: Self-pay | Admitting: Family Medicine

## 2023-12-29 ENCOUNTER — Other Ambulatory Visit: Payer: Self-pay | Admitting: Family Medicine

## 2023-12-31 ENCOUNTER — Telehealth: Payer: Self-pay

## 2023-12-31 DIAGNOSIS — M25511 Pain in right shoulder: Secondary | ICD-10-CM

## 2023-12-31 NOTE — Progress Notes (Signed)
 Complex Care Management Note  Care Guide Note 12/31/2023 Name: Alexa Newman MRN: [id number] DOB: 1960/10/16  Alexa Newman is a 63 y.o. year old female who sees Bacigalupo, Jon HERO, MD for primary care. I reached out to [name] by phone today to offer complex care management services.  Alexa Newman was given information about Complex Care Management services today including:   The Complex Care Management services include support from the care team which includes your Nurse Care Manager, Clinical Social Worker, or Pharmacist.  The Complex Care Management team is here to help remove barriers to the health concerns and goals most important to you. Complex Care Management services are voluntary, and the patient may decline or stop services at any time by request to their care team member.   Complex Care Management Consent Status: Patient agreed to services and verbal consent obtained.   Follow up plan:  Telephone appointment with complex care management team member scheduled for:  01/07/24 @ 2 PM.  Encounter Outcome:  Patient Scheduled  Leotis Rase [facility], [facility] Guide  Direct Dial: [phone]  Fax [phone]

## 2024-01-07 ENCOUNTER — Other Ambulatory Visit: Payer: Self-pay | Admitting: *Deleted

## 2024-01-07 NOTE — Patient Outreach (Signed)
 Complex Care Management   Visit Note  01/07/2024  Name:  Alexa Newman MRN: [id number] DOB: 07/08/1960  Situation: Referral received for Complex Care Management related to HTN I obtained verbal consent from Patient.  Visit completed with Patient  on the phone  Background:   Past Medical History:  Diagnosis Date   Anxiety 1980's   Arthritis 2000   Bipolar 1 disorder (HCC)    Depression 1980's   Family history of adverse reaction to anesthesia    half sister claims that she would stop breathing during surgery x 2   Hypertension 2010   Osteoarthritis    PONV (postoperative nausea and vomiting)    Pre-diabetes     Assessment: Patient Reported Symptoms:  Cognitive Cognitive Status: Able to follow simple commands, Alert and oriented to person, place, and time, Normal speech and language skills   Health Maintenance Behaviors: Annual physical exam  Neurological Neurological Review of Symptoms: No symptoms reported Neurological Management Strategies: Routine screening  HEENT HEENT Symptoms Reported: No symptoms reported HEENT Management Strategies: Routine screening    Cardiovascular Cardiovascular Symptoms Reported: No symptoms reported Does patient have uncontrolled Hypertension?: Yes Is patient checking Blood Pressure at home?: Yes Patient's Recent BP reading at home: last reading reported 135/80 last week. States reduced weight (47 lbs) which has improved her BP and now only taking one BP medication. Cardiovascular Management Strategies: Coping strategies, Medication therapy, Exercise Cardiovascular Self-Management Outcome: 4 (good)  Respiratory Respiratory Symptoms Reported: No symptoms reported Other Respiratory Symptoms: Pt takes she smokes however weaning down with 6 cigarettes a daily from a pack a day. Respiratory Management Strategies: Routine screening  Endocrine Endocrine Symptoms Reported: No symptoms reported Is patient diabetic?: No    Gastrointestinal  Gastrointestinal Symptoms Reported: No symptoms reported      Genitourinary Genitourinary Symptoms Reported: No symptoms reported    Integumentary Integumentary Symptoms Reported: No symptoms reported    Musculoskeletal Musculoskelatal Symptoms Reviewed: Limited mobility Additional Musculoskeletal Details: Isssues with acute right should pain over the last month with no relief, lifting aggravate it more in certain positions. Musculoskeletal Management Strategies: Coping strategies, Medication therapy, Routine screening, Adequate rest Musculoskeletal Comment: Pending appointment with ortho Dr. Tobie on 01/12/2024 for further interventions onthe right should pain. Falls in the past year?: No Number of falls in past year: 1 or less Was there an injury with Fall?: No Fall Risk Category Calculator: 0 Patient Fall Risk Level: Low Fall Risk Patient at Risk for Falls Due to: No Fall Risks  Psychosocial Psychosocial Symptoms Reported: No symptoms reported   Major Change/Loss/Stressor/Fears (CP): Denies Quality of Family Relationships: helpful, involved, supportive Do you feel physically threatened by others?: No    01/07/2024    PHQ2-9 Depression Screening   Little interest or pleasure in doing things Not at all  Feeling down, depressed, or hopeless Not at all  PHQ-2 - Total Score 0  Trouble falling or staying asleep, or sleeping too much    Feeling tired or having little energy    Poor appetite or overeating     Feeling bad about yourself - or that you are a failure or have let yourself or your family down    Trouble concentrating on things, such as reading the newspaper or watching television    Moving or speaking so slowly that other people could have noticed.  Or the opposite - being so fidgety or restless that you have been moving around a lot more than usual    Thoughts that  you would be better off dead, or hurting yourself in some way    PHQ2-9 Total Score    If you checked off any  problems, how difficult have these problems made it for you to do your work, take care of things at home, or get along with other people    Depression Interventions/Treatment      There were no vitals filed for this visit.  Medications Reviewed Today     Reviewed by Alvia Olam BIRCH, RN (Registered Nurse) on 01/07/24 at 1419  Med List Status: <None>   Medication Order Taking? Sig Documenting Provider Last Dose Status Informant  acetaminophen  (TYLENOL ) 650 MG CR tablet [id number] Yes Take 1,300 mg by mouth every 8 (eight) hours as needed for pain. [provider]  Active Self  amLODipine  (NORVASC ) 10 MG tablet [id number] Yes Take 1 tablet by mouth once daily Bacigalupo, Angela M, MD  Active   aspirin  81 MG chewable tablet [id number] Yes Chew 1 tablet (81 mg total) by mouth 2 (two) times daily. Drake Chew, PA-C  Active   buPROPion (WELLBUTRIN XL) 150 MG 24 hr tablet [id number] Yes Take 1 tablet (150 mg total) by mouth daily. Bacigalupo, Angela M, MD  Active   celecoxib  (CELEBREX ) 200 MG capsule [id number] Yes Take 1 capsule (200 mg total) by mouth 2 (two) times daily. Drake Chew, PA-C  Active   doxepin  (SINEQUAN ) 10 MG capsule [id number] Yes Take 1 capsule (10 mg total) by mouth at bedtime as needed. For sleep Eappen, Saramma, MD  Active   lamoTRIgine  (LAMICTAL ) 100 MG tablet [id number] Yes TAKE 1 TABLET BY MOUTH TWICE A DAY Eappen, Saramma, MD  Active   methocarbamol  (ROBAXIN ) 500 MG tablet [id number] Yes Take 500 mg by mouth 2 (two) times daily. [provider]  Active Self  naltrexone (DEPADE) 50 MG tablet [id number] Yes Take 0.5 tablets (25 mg total) by mouth daily. Bacigalupo, Angela M, MD  Active   rosuvastatin  (CRESTOR ) 5 MG tablet [id number] Yes Take 1 tablet (5 mg total) by mouth every other day. TAKE 1 TABLET BY MOUTH EVERY OTHER DAY Myrla Jon HERO, MD  Active Self  Semaglutide -Weight Management 1 MG/0.5ML EMMANUEL [id number]  Inject 1 mg into the skin once a week.   Patient not taking: Reported on 01/07/2024   Myrla Jon HERO, MD  Active Self           Med Note JERALYN, [place]OR[place] A   Thu Jun 17, 2023 12:14 PM) Mondays, currently on hold for procedure            Recommendation:   PCP Follow-up Continue Current Plan of Care  Follow Up Plan:   Telephone follow up appointment date/time:  02/01/2024 @ 9:00 am   Olam Alvia, RN, BSN [place]  [facility], [facility] RN Care Manager Direct Dial: [phone]  Fax: [phone]

## 2024-01-07 NOTE — Patient Instructions (Signed)
 Visit Information  [name] was given information about Medicaid Managed Care team care coordination services as a part of their [employer] Medicaid benefit. Alexa Newman   If you would like to schedule transportation through your [facility] plan, please call the following number at least 2 days in advance of your appointment: [phone]  For information about your ride after you set it up, call Ride Assist at [phone]. Use this number to activate a Will Call pickup, or if your transportation is late for a scheduled pickup. Use this number, too, if you need to make a change or cancel a previously scheduled reservation.  If you need transportation services right away, call [phone]. The after-hours call center is staffed 24 hours to handle ride assistance and urgent reservation requests (including discharges) 365 days a year. Urgent trips include sick visits, hospital discharge requests and life-sustaining treatment.  Call the [facility] Line at [phone], at any time, 24 hours a day, 7 days a week. If you are in danger or need immediate medical attention call 911.   Please see education materials related to HTN provided by MyChart link.  Care plan and visit instructions communicated with the patient verbally today. Patient agrees to receive a copy in MyChart. Active MyChart status and patient understanding of how to access instructions and care plan via MyChart confirmed with patient.     Telephone follow up appointment with Managed Medicaid care management team member scheduled for: 02/01/2024 @ 9:00am   Olam Ku, RN, BSN [place]  [facility], [facility] RN Care Manager Direct Dial: [phone]  Fax: [phone]

## 2024-01-12 ENCOUNTER — Other Ambulatory Visit: Payer: Self-pay | Admitting: Family Medicine

## 2024-01-12 DIAGNOSIS — G8929 Other chronic pain: Secondary | ICD-10-CM | POA: Diagnosis not present

## 2024-01-12 DIAGNOSIS — M25511 Pain in right shoulder: Secondary | ICD-10-CM | POA: Diagnosis not present

## 2024-02-01 ENCOUNTER — Other Ambulatory Visit: Payer: Self-pay | Admitting: *Deleted

## 2024-02-01 NOTE — Patient Instructions (Addendum)
  Visit Information  Thank you for taking time to visit with me today. Please don't hesitate to contact me if I can be of assistance to you.  Please call the Suicide and Crisis Lifeline: 988 call the [place]  National Suicide Prevention Lifeline: 4041854659 or TTY: (314)886-8819 TTY 825 568 2690) to talk to a trained counselor call 1-800-273-TALK (toll free, 24 hour hotline) if you are experiencing a Mental Health or Behavioral Health Crisis or need someone to talk to.  This case has been closed at the request of the patient at this time.  Olam Ku, RN, BSN [place]  [facility], [facility] RN Care Manager Direct Dial: [phone]  Fax: [phone]

## 2024-02-01 NOTE — Patient Outreach (Signed)
 Complex Care Management   Visit Note  02/01/2024  Name:  Alexa Newman MRN: [id number] DOB: [birth year]  Situation: Pt's comment indicates she is not interested in the program indicating it is a waste of her time and she is managing her health attending her medical appointments. RNCM reiterated on the program and available services to her involving social workers, pharmacy and RNCM to assist with improving her health assisting with managing her HTN. Again, pt opt to declined further contact and/or services at this time.   Pt is aware her provider will be notified and she is able to reach out if she requires services via [facility] for care management needs in the future.  Follow Up Plan:   Closing From:  Complex Care Management   Olam Ku, RN, BSN [place]  [facility], [facility] RN Care Manager Direct Dial: [phone]  Fax: [phone]

## 2024-02-10 ENCOUNTER — Other Ambulatory Visit: Payer: Self-pay

## 2024-03-08 ENCOUNTER — Telehealth: Admitting: Psychiatry

## 2024-03-08 ENCOUNTER — Encounter: Payer: Self-pay | Admitting: Psychiatry

## 2024-03-08 DIAGNOSIS — F17200 Nicotine dependence, unspecified, uncomplicated: Secondary | ICD-10-CM | POA: Diagnosis not present

## 2024-03-08 DIAGNOSIS — F1421 Cocaine dependence, in remission: Secondary | ICD-10-CM

## 2024-03-08 DIAGNOSIS — F411 Generalized anxiety disorder: Secondary | ICD-10-CM

## 2024-03-08 DIAGNOSIS — Z8659 Personal history of other mental and behavioral disorders: Secondary | ICD-10-CM

## 2024-03-08 DIAGNOSIS — F5105 Insomnia due to other mental disorder: Secondary | ICD-10-CM | POA: Diagnosis not present

## 2024-03-08 DIAGNOSIS — F3176 Bipolar disorder, in full remission, most recent episode depressed: Secondary | ICD-10-CM | POA: Diagnosis not present

## 2024-03-08 NOTE — Progress Notes (Signed)
 Virtual Visit via Video Note  I connected with Alexa Newman on 03/08/2024 at 12:00 PM EST by a video enabled telemedicine application and verified that I am speaking with the correct person using two identifiers.  Location Provider Location : [facility] Patient Location : Work   Participants: Patient , Provider   I discussed the limitations of evaluation and management by telemedicine and the availability of in person appointments. The patient expressed understanding and agreed to proceed   I discussed the assessment and treatment plan with the patient. The patient was provided an opportunity to ask questions and all were answered. The patient agreed with the plan and demonstrated an understanding of the instructions.   The patient was advised to call back or seek an in-person evaluation if the symptoms worsen or if the condition fails to improve as anticipated.   BH MD OP Progress Note  03/08/2024 12:26 PM NIKHITA MENTZEL  MRN:  [id number]  Chief Complaint:  Chief Complaint  Patient presents with   Follow-up   Anxiety   Medication Refill   bipolar disorders   Discussed the use of AI scribe software for clinical note transcription with the patient, who gave verbal consent to proceed.  History of Present Illness Alexa Newman is a 63 year old Caucasian female, divorced, on SSI, lives in [place], has a history of bipolar disorder, borderline personality disorder, cocaine use disorder, GAD, insomnia, tobacco use disorder was evaluated by telemedicine today.  Increased stress related to her brother's recent diagnosis and treatment for anal cancer has affected her, noting that this is the third sibling to have cancer. She initially struggled to cope but now manages well, reporting improved mood due to the doctors' positive outlook and her ability to support her brother through appointments. She expresses gratitude for being able to help and finds her brother's dependence  challenging at times.  She states that her overall mood is good and that she is doing well emotionally. She denies any thoughts of hurting herself or others.  She reports that her sleep remains stable, with a consistent routine of going to bed by 11 p.m. and waking around 5 a.m., experiencing only occasional awakenings during the night. She notes that her sleep quality is better than it has ever been.  She describes her appetite as good and reports maintaining her weight after previously losing 45 pounds with Wegovy . She currently takes Contrave (naltrexone  and Wellbutrin ) to help maintain her weight. She also reports taking Lamictal  100 mg twice daily as a mood stabilizer and doxepin  as needed for sleep, though she cannot recall the last time she used doxepin  and states she does not need it much.  She reports current cigarette use, smoking approximately 7 cigarettes per day, with a pack lasting about 3 days. She previously smoked a pack per day. She was doing well with reducing use but has recently stopped trying to quit. She describes efforts to reduce smoking by purchasing cigarettes that taste unpleasant and no longer smokes inside the house.     Visit Diagnosis:    ICD-10-CM   1. Bipolar disorder, in full remission, most recent episode depressed  F31.76     2. GAD (generalized anxiety disorder)  F41.1     3. Insomnia due to mental condition  F51.05    Pain, mood symptoms    4. Tobacco use disorder  F17.200    MILD    5. Hx of borderline personality disorder  Z86.59     6. Cocaine  use disorder, moderate, in sustained remission (HCC)  F14.21       Past Psychiatric History: I have reviewed past psychiatric history from progress note on 07/27/2017.  Past Medical History:  Past Medical History:  Diagnosis Date   Anxiety 1980's   Arthritis 2000   Bipolar 1 disorder (HCC)    Depression 1980's   Family history of adverse reaction to anesthesia    half sister claims that she would  stop breathing during surgery x 2   Hypertension 2010   Osteoarthritis    PONV (postoperative nausea and vomiting)    Pre-diabetes     Past Surgical History:  Procedure Laterality Date   ANTERIOR CRUCIATE LIGAMENT (ACL) REVISION Right    CARPAL TUNNEL RELEASE     COLONOSCOPY     COLONOSCOPY WITH PROPOFOL  N/A 11/17/2017   Procedure: COLONOSCOPY WITH PROPOFOL ;  Surgeon: Janalyn Keene NOVAK, MD;  Location: [facility] ENDOSCOPY;  Service: Endoscopy;  Laterality: N/A;   COLONOSCOPY WITH PROPOFOL  N/A 11/24/2022   Procedure: COLONOSCOPY WITH PROPOFOL ;  Surgeon: Therisa Bi, MD;  Location: [facility] ENDOSCOPY;  Service: Gastroenterology;  Laterality: N/A;   COLONOSCOPY WITH PROPOFOL  N/A 01/25/2023   Procedure: COLONOSCOPY WITH PROPOFOL ;  Surgeon: Therisa Bi, MD;  Location[facility] ENDOSCOPY;  Service: Gastroenterology;  Laterality: N/A;   DILATION AND CURETTAGE OF UTERUS  2013   for heavy bleeding   ENDOMETRIAL ABLATION  2013   JOINT REPLACEMENT  2021   Rt knee replacement   KNEE ARTHROPLASTY Right 10/02/2020   Procedure: COMPUTER ASSISTED TOTAL KNEE ARTHROPLASTY;  Surgeon: Mardee Lynwood SQUIBB, MD;  Location: [facility] ORS;  Service: Orthopedics;  Laterality: Right;   KNEE ARTHROPLASTY Left 06/28/2023   Procedure: ARTHROPLASTY, KNEE, TOTAL, USING IMAGELESS COMPUTER-ASSISTED NAVIGATION;  Surgeon: Mardee Lynwood SQUIBB, MD;  Location: [facility] ORS;  Service: Orthopedics;  Laterality: Left;   POLYPECTOMY  01/25/2023   Procedure: POLYPECTOMY;  Surgeon: Therisa Bi, MD;  Location: [facility] ENDOSCOPY;  Service: Gastroenterology;;   right wrist surgery     TUBAL LIGATION  1990   WRIST ARTHROSCOPY Left 1990   for degenerative changes    Family Psychiatric History: I have reviewed family psychiatric history from progress note on 07/27/2017.  Family History:  Family History  Problem Relation Age of Onset   Diabetes Mother    Depression Mother    Hypertension Mother    Crohn's disease Mother    Lung cancer Mother 60        former smoker   Congestive Heart Failure Father    Alcohol abuse Father    Breast cancer Sister 50   Alcohol abuse Sister    Drug abuse Sister    Anxiety disorder Sister    Depression Sister    Bipolar disorder Sister    Hypertension Sister    Alcohol abuse Brother    Drug abuse Brother    Anxiety disorder Brother    Depression Brother    Colon polyps Brother    HIV Brother    Hypertension Brother    Alcohol abuse Brother    Drug abuse Brother    Anxiety disorder Brother    Depression Brother    Colon polyps Brother    HIV Brother    Hypertension Brother    Skin cancer Maternal Grandfather    Colon cancer Neg Hx    Ovarian cancer Neg Hx    Cervical cancer Neg Hx     Social History: I have reviewed social history from progress note on 07/27/2017. Social History  Socioeconomic History   Marital status: Divorced    Spouse name: Not on file   Number of children: 1   Years of education: Not on file   Highest education level: Some college, no degree  Occupational History   Occupation: disabled    Comment: on SSI awaiting decision about disability  Tobacco Use   Smoking status: Every Day    Current packs/day: 0.50    Average packs/day: 0.5 packs/day for 50.0 years (25.0 ttl pk-yrs)    Types: Cigarettes   Smokeless tobacco: Never   Tobacco comments:    Currently smokes 1 cig every few days  Vaping Use   Vaping status: Former  Substance and Sexual Activity   Alcohol use: Not Currently    Comment: 1 times per month   Drug use: Not Currently    Comment: former crack cocaine user   Sexual activity: Not Currently    Partners: Male    Birth control/protection: Post-menopausal, Surgical  Other Topics Concern   Not on file  Social History Narrative   Live with brother and his husband   Social Drivers of Health   Tobacco Use: High Risk (03/08/2024)   Patient History    Smoking Tobacco Use: Every Day    Smokeless Tobacco Use: Never    Passive Exposure: Not on file   Financial Resource Strain: Low Risk (09/01/2023)   Overall Financial Resource Strain (CARDIA)    Difficulty of Paying Living Expenses: Not very hard  Recent Concern: Financial Resource Strain - Medium Risk (06/10/2023)   Received from [facility]   Overall Financial Resource Strain (CARDIA)    Difficulty of Paying Living Expenses: Somewhat hard  Food Insecurity: No Food Insecurity (01/07/2024)   Epic    Worried About Radiation Protection Practitioner of Food in the Last Year: Never true    Ran Out of Food in the Last Year: Never true  Transportation Needs: No Transportation Needs (01/07/2024)   Epic    Lack of Transportation (Medical): No    Lack of Transportation (Non-Medical): No  Physical Activity: Inactive (09/01/2023)   Exercise Vital Sign    Days of Exercise per Week: 0 days    Minutes of Exercise per Session: Not on file  Stress: No Stress Concern Present (09/01/2023)   [employer] of Occupational Health - Occupational Stress Questionnaire    Feeling of Stress: Only a little  Social Connections: Socially Isolated (09/01/2023)   Social Connection and Isolation Panel    Frequency of Communication with Friends and Family: More than three times a week    Frequency of Social Gatherings with Friends and Family: More than three times a week    Attends Religious Services: Never    Database Administrator or Organizations: No    Attends Engineer, Structural: Not on file    Marital Status: Divorced  Depression (PHQ2-9): Low Risk (01/07/2024)   Depression (PHQ2-9)    PHQ-2 Score: 0  Alcohol Screen: Low Risk (09/01/2023)   Alcohol Screen    Last Alcohol Screening Score (AUDIT): 1  Housing: Low Risk (01/07/2024)   Epic    Unable to Pay for Housing in the Last Year: No    Number of Times Moved in the Last Year: 0    Homeless in the Last Year: No  Utilities: Not At Risk (01/07/2024)   Epic    Threatened with loss of utilities: No  Health Literacy: Not on file    Allergies:  Allergies[1]  Metabolic Disorder  Labs: Lab Results  Component Value Date   HGBA1C 5.5 09/06/2023   No results found for: PROLACTIN Lab Results  Component Value Date   CHOL 157 04/08/2023   TRIG 125 04/08/2023   HDL 62 04/08/2023   CHOLHDL 2.6 12/02/2020   LDLCALC 73 04/08/2023   LDLCALC 71 10/05/2022   Lab Results  Component Value Date   TSH 0.661 10/05/2022   TSH 0.801 12/02/2020    Therapeutic Level Labs: No results found for: LITHIUM No results found for: VALPROATE No results found for: CBMZ  Current Medications: Current Outpatient Medications  Medication Sig Dispense Refill   acetaminophen  (TYLENOL ) 650 MG CR tablet Take 1,300 mg by mouth every 8 (eight) hours as needed for pain.     amLODipine  (NORVASC ) 10 MG tablet Take 1 tablet by mouth once daily 90 tablet 0   aspirin  81 MG chewable tablet Chew 1 tablet (81 mg total) by mouth 2 (two) times daily.     buPROPion  (WELLBUTRIN  XL) 150 MG 24 hr tablet Take 1 tablet (150 mg total) by mouth daily. 30 tablet 2   celecoxib  (CELEBREX ) 200 MG capsule Take 1 capsule by mouth twice daily 60 capsule 0   doxepin  (SINEQUAN ) 10 MG capsule Take 1 capsule (10 mg total) by mouth at bedtime as needed. For sleep 30 capsule 1   lamoTRIgine  (LAMICTAL ) 100 MG tablet TAKE 1 TABLET BY MOUTH TWICE A DAY 180 tablet 1   methocarbamol  (ROBAXIN ) 500 MG tablet Take 500 mg by mouth 2 (two) times daily.     naltrexone  (DEPADE) 50 MG tablet Take 0.5 tablets (25 mg total) by mouth daily. 15 tablet 3   rosuvastatin  (CRESTOR ) 5 MG tablet Take 1 tablet (5 mg total) by mouth every other day. TAKE 1 TABLET BY MOUTH EVERY OTHER DAY 45 tablet 3   No current facility-administered medications for this visit.     Musculoskeletal: Strength & Muscle Tone: UTA Gait & Station: Seated Patient leans: N/A  Psychiatric Specialty Exam: Review of Systems  Psychiatric/Behavioral:  The patient is nervous/anxious.     Last menstrual period 12/07/2011.There  is no height or weight on file to calculate BMI.  General Appearance: Fairly Groomed  Eye Contact:  Fair  Speech:  Clear and Coherent  Volume:  Normal  Mood:  Anxious coping well  Affect:  Appropriate  Thought Process:  Goal Directed and Descriptions of Associations: Intact  Orientation:  Full (Time, Place, and Person)  Thought Content: Logical   Suicidal Thoughts:  No  Homicidal Thoughts:  No  Memory:  Immediate;   Fair Recent;   Fair Remote;   Fair  Judgement:  Fair  Insight:  Fair  Psychomotor Activity:  Normal  Concentration:  Concentration: Fair and Attention Span: Fair  Recall:  [employer] of Knowledge: Fair  Language: Fair  Akathisia:  No  Handed:  Right  AIMS (if indicated): not done  Assets:  Communication Skills Desire for Improvement Housing Social Support Talents/Skills Transportation  ADL's:  Intact  Cognition: WNL  Sleep:  Fair   Screenings: AIMS    Flowsheet Row Video Visit from 10/16/2021 in [facility] Video Visit from 07/17/2021 in [facility]  AIMS Total Score 0 0   GAD-7    Flowsheet Row Office Visit from 08/17/2023 in [facility] Office Visit from 02/16/2023 in [facility] Office Visit from 10/01/2022 in [facility] Video Visit from 10/16/2021 in [facility] Video Visit from 07/17/2021 in [facility]  Total GAD-7 Score 1 3 7 13 2    PHQ2-9    Flowsheet Row Patient Outreach Telephone from 01/07/2024 in [facility] Office Visit from 08/17/2023 in [facility] Office Visit from 02/16/2023 in [facility] Office Visit from 12/21/2022 in [facility] Office Visit from 10/05/2022 in [facility]  PHQ-2 Total Score 0 0 0 1 3  PHQ-9 Total Score -- -- -- -- 8   Flowsheet Row Video Visit from 03/08/2024 in [facility] Video Visit from 11/26/2023 in [facility] Video Visit from 09/02/2023 in [facility]  C-SSRS RISK CATEGORY Moderate Risk Moderate Risk Moderate Risk     Assessment and Plan: Alexa Newman is a 63 year old Caucasian female, divorced, lives in [place] has a history of bipolar disorder, insomnia, borderline personality disorder was evaluated by telemedicine today.  Discussed assessment and plan as noted below.  1. Bipolar disorder, in full remission, most recent episode depressed Currently denies any mood symptoms mania or depression. Continue Lamotrigine  100 mg twice daily  2. GAD (generalized anxiety disorder)-stable Currently coping well with anxiety symptoms Continue Hydroxyzine  10 mg daily as needed  3. Insomnia due to mental condition-stable Currently reports sleep is overall good. Continue sleep hygiene techniques. Continue Doxepin  10 mg at bedtime as needed  4. Tobacco use disorder mild-unstable Currently working on cutting back on smoking. Provided counseling for 1 minute.  5. Hx of borderline personality disorder Will monitor closely.  Denies concerns.  6. Cocaine use disorder, moderate, in sustained remission (HCC) Currently sober.  Follow-up Follow-up in clinic in 3 months or sooner if needed.   Consent: Patient/Guardian gives verbal consent for treatment and assignment of benefits for services provided during this visit. Patient/Guardian expressed understanding and agreed to proceed.   This note was generated in part or whole with voice recognition software. Voice recognition is usually quite accurate but there are transcription errors that can  and very often do occur. I apologize for any typographical errors that were not detected and corrected.    Paisley Grajeda, MD 03/08/2024, 12:26 PM     [1]  Allergies Allergen Reactions   Pregabalin Other (See Comments)    Suicidal ideation

## 2024-03-15 ENCOUNTER — Other Ambulatory Visit: Payer: Self-pay

## 2024-03-29 ENCOUNTER — Other Ambulatory Visit: Payer: Self-pay | Admitting: Family Medicine

## 2024-03-29 ENCOUNTER — Other Ambulatory Visit: Payer: Self-pay

## 2024-04-11 ENCOUNTER — Encounter: Admitting: Family Medicine

## 2024-04-18 ENCOUNTER — Encounter: Admitting: Family Medicine

## 2024-06-07 ENCOUNTER — Telehealth: Admitting: Psychiatry
